# Patient Record
Sex: Female | Born: 1940 | Race: White | Hispanic: No | Marital: Married | Smoking: Former smoker
Health system: Southern US, Community
[De-identification: ages and names within clinical notes are randomized; demographics above are authoritative.]

## PROBLEM LIST (undated history)

## (undated) DIAGNOSIS — I10 Essential (primary) hypertension: Secondary | ICD-10-CM

## (undated) DIAGNOSIS — E079 Disorder of thyroid, unspecified: Secondary | ICD-10-CM

## (undated) DIAGNOSIS — K219 Gastro-esophageal reflux disease without esophagitis: Secondary | ICD-10-CM

## (undated) HISTORY — PX: ABDOMINAL HYSTERECTOMY: SHX81

## (undated) HISTORY — PX: APPENDECTOMY: SHX54

## (undated) HISTORY — PX: EYE SURGERY: SHX253

## (undated) HISTORY — PX: CHOLECYSTECTOMY: SHX55

---

## 2017-11-11 ENCOUNTER — Emergency Department (HOSPITAL_BASED_OUTPATIENT_CLINIC_OR_DEPARTMENT_OTHER)
Admission: EM | Admit: 2017-11-11 | Discharge: 2017-11-11 | Disposition: A | Discharge status: Home / Self Care | Payer: Medicare HMO | Attending: Emergency Medicine | Admitting: Emergency Medicine

## 2017-11-11 ENCOUNTER — Other Ambulatory Visit: Payer: Self-pay

## 2017-11-11 ENCOUNTER — Emergency Department (HOSPITAL_BASED_OUTPATIENT_CLINIC_OR_DEPARTMENT_OTHER): Payer: Medicare HMO

## 2017-11-11 ENCOUNTER — Encounter (HOSPITAL_BASED_OUTPATIENT_CLINIC_OR_DEPARTMENT_OTHER): Payer: Self-pay | Admitting: *Deleted

## 2017-11-11 DIAGNOSIS — I10 Essential (primary) hypertension: Secondary | ICD-10-CM | POA: Diagnosis not present

## 2017-11-11 DIAGNOSIS — E079 Disorder of thyroid, unspecified: Secondary | ICD-10-CM | POA: Diagnosis not present

## 2017-11-11 DIAGNOSIS — Y93K9 Activity, other involving animal care: Secondary | ICD-10-CM | POA: Diagnosis not present

## 2017-11-11 DIAGNOSIS — Y92008 Other place in unspecified non-institutional (private) residence as the place of occurrence of the external cause: Secondary | ICD-10-CM | POA: Diagnosis not present

## 2017-11-11 DIAGNOSIS — W010XXA Fall on same level from slipping, tripping and stumbling without subsequent striking against object, initial encounter: Secondary | ICD-10-CM | POA: Insufficient documentation

## 2017-11-11 DIAGNOSIS — Z79899 Other long term (current) drug therapy: Secondary | ICD-10-CM | POA: Diagnosis not present

## 2017-11-11 DIAGNOSIS — S92502A Displaced unspecified fracture of left lesser toe(s), initial encounter for closed fracture: Secondary | ICD-10-CM

## 2017-11-11 DIAGNOSIS — Y999 Unspecified external cause status: Secondary | ICD-10-CM | POA: Diagnosis not present

## 2017-11-11 DIAGNOSIS — S92515A Nondisplaced fracture of proximal phalanx of left lesser toe(s), initial encounter for closed fracture: Secondary | ICD-10-CM | POA: Insufficient documentation

## 2017-11-11 DIAGNOSIS — S99922A Unspecified injury of left foot, initial encounter: Secondary | ICD-10-CM | POA: Diagnosis present

## 2017-11-11 HISTORY — DX: Essential (primary) hypertension: I10

## 2017-11-11 HISTORY — DX: Gastro-esophageal reflux disease without esophagitis: K21.9

## 2017-11-11 HISTORY — DX: Disorder of thyroid, unspecified: E07.9

## 2017-11-11 IMAGING — DX DG FOOT COMPLETE 3+V*L*
3 series · 3 of 3 positions shown · non-contrast
Comparison: None.

CLINICAL DATA: Left foot pain with swelling and bruising after fall
tonight.

EXAM:
LEFT FOOT - COMPLETE 3+ VIEW

[foot ap]
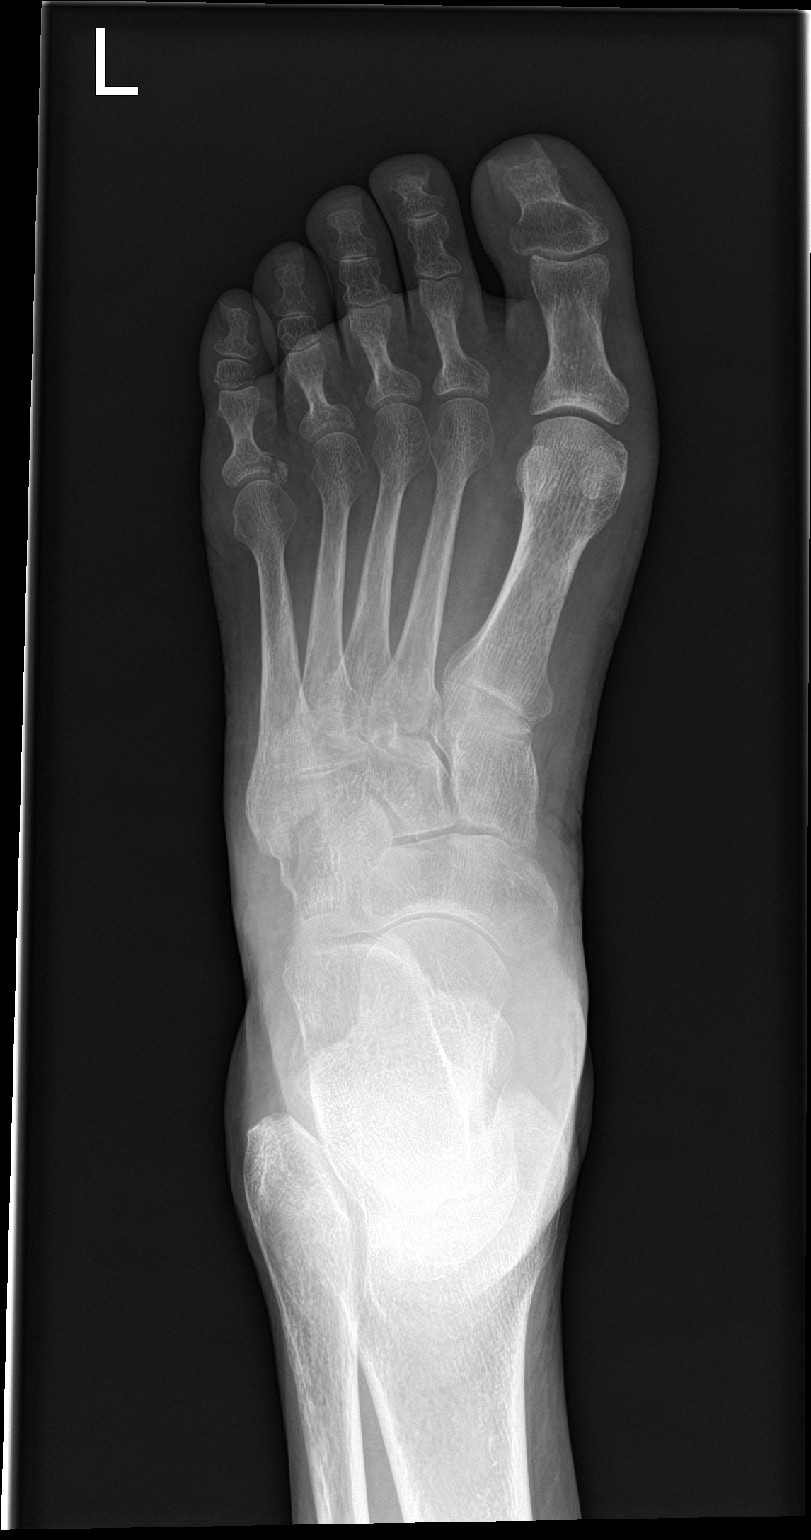

[foot obl]
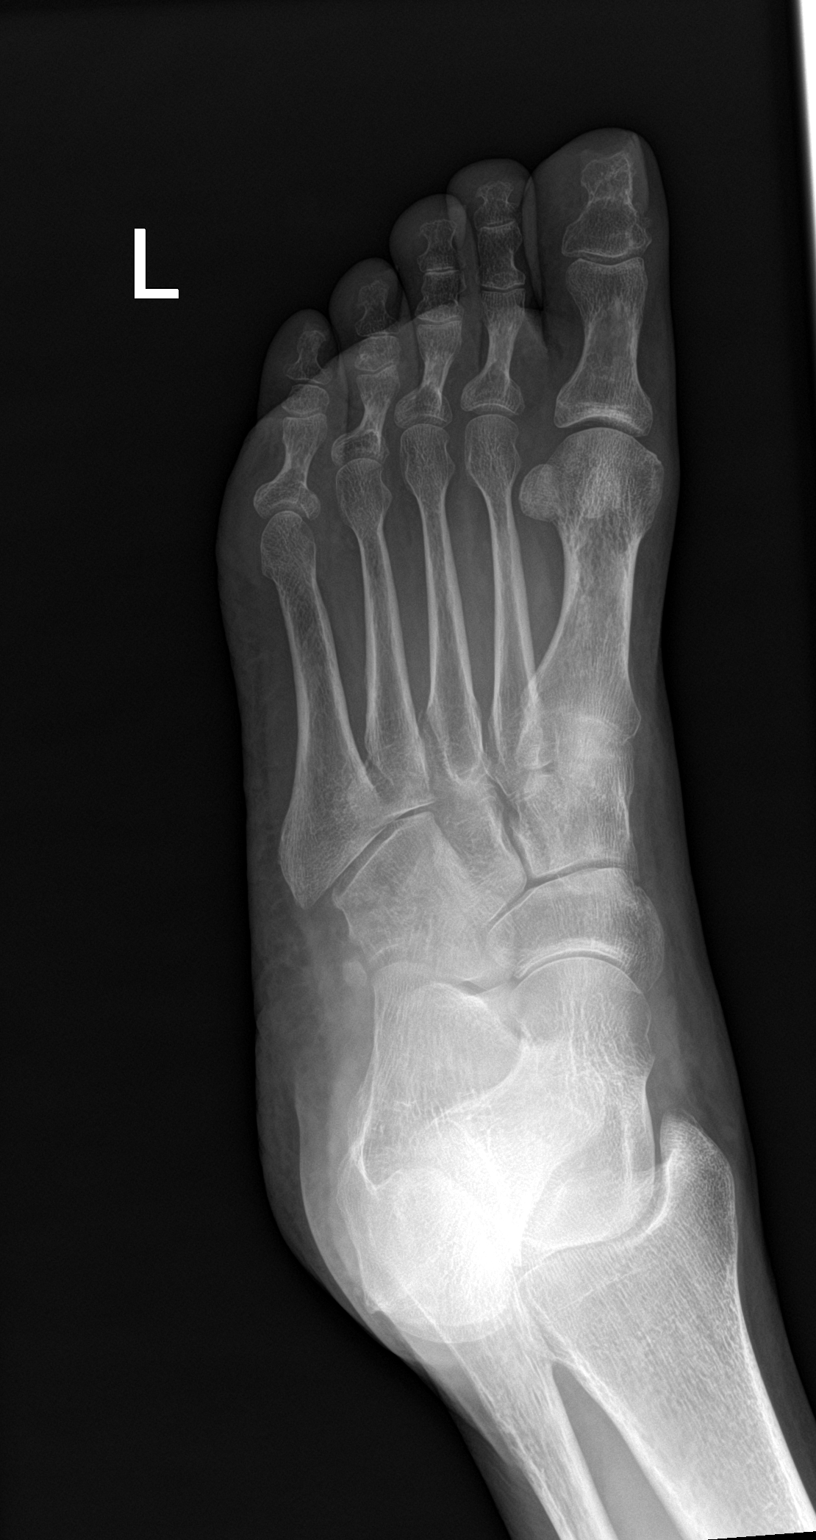

[foot lat]
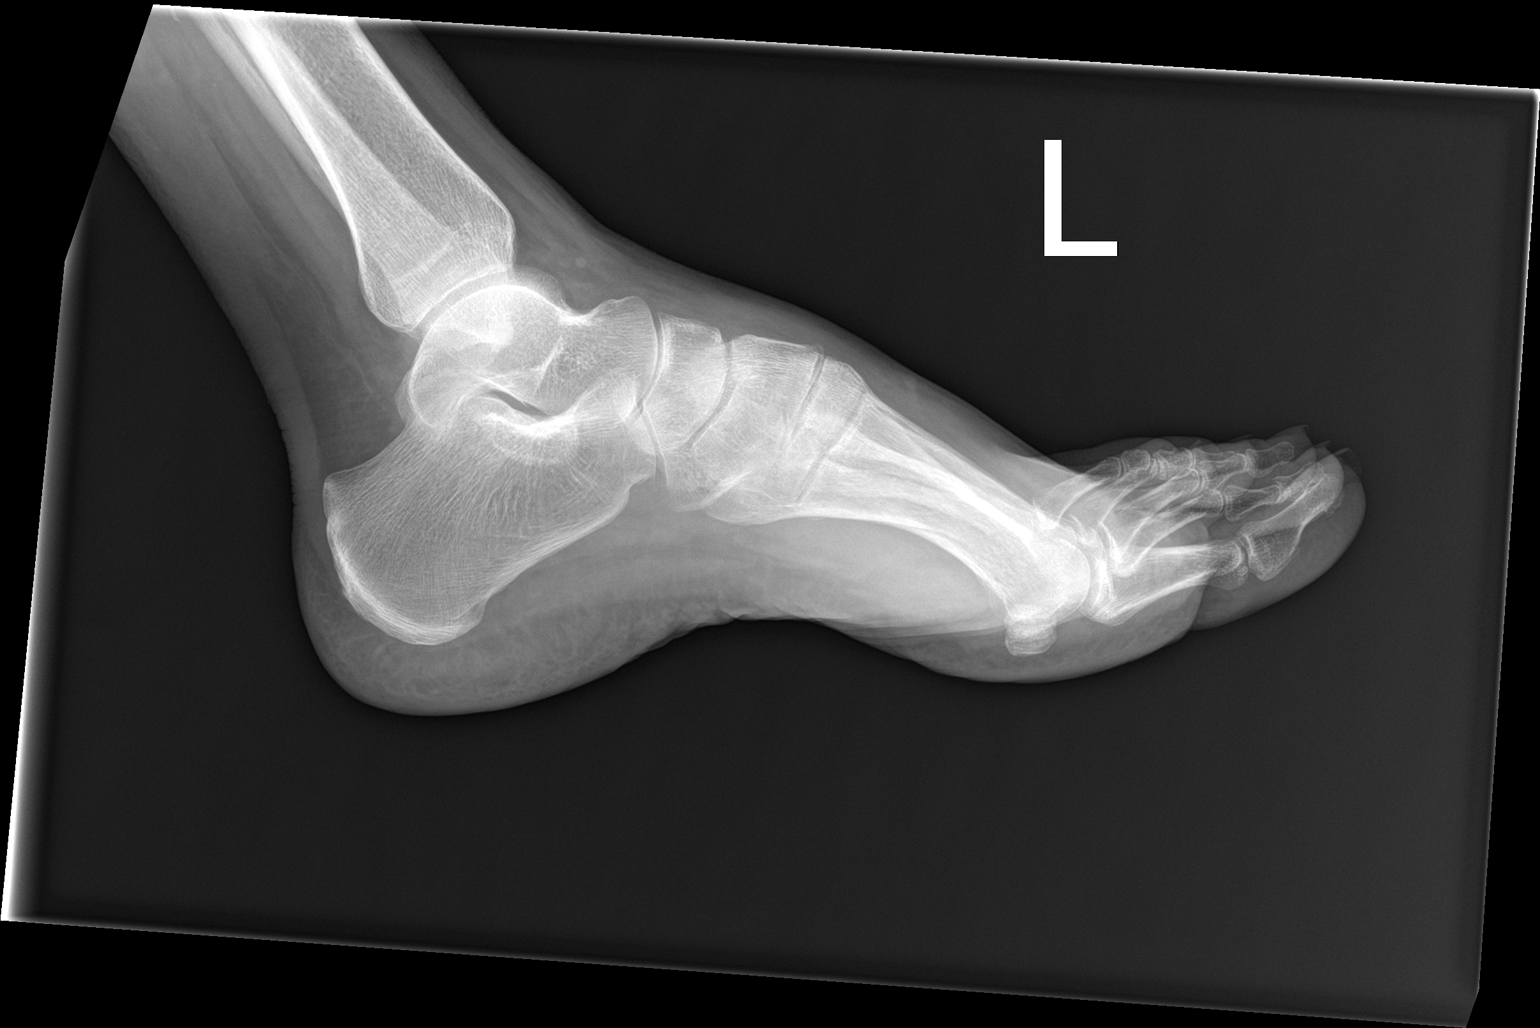

[3 of 3 positions shown; findings below may reference images not displayed]

FINDINGS: There is acute fracture of the medial base of the proximal phalanx
small toe extending into the proximal articular surface.

No malalignment at the Lisfranc joint. Exaggerated longitudinal arch
of the foot on the lateral projection, which is not performed with
the patient bearing weight. No additional fracture identified.
IMPRESSION: 1. Acute fracture of the medial base of the proximal phalanx small
toe extending to the proximal articular surface.

## 2017-11-11 MED ORDER — ACETAMINOPHEN 500 MG PO TABS
1000.0000 mg | ORAL_TABLET | Freq: Once | ORAL | Status: AC
  Administered 2017-11-11: 1000 mg via ORAL
  Filled 2017-11-11: qty 2

## 2017-11-11 MED ORDER — OXYCODONE HCL 5 MG PO TABS
2.5000 mg | ORAL_TABLET | Freq: Once | ORAL | Status: AC
  Administered 2017-11-11: 2.5 mg via ORAL
  Filled 2017-11-11: qty 1

## 2017-11-11 NOTE — ED Triage Notes (Signed)
She tripped on her bedroom shoe while turning and fell. Injury to her left foot. Swelling and bruising. Pain in her upper left leg.

## 2017-11-11 NOTE — ED Provider Notes (Signed)
[facility] EMERGENCY DEPARTMENT Provider Note   CSN: [id number] Arrival date & time: 11/11/17  2156     History   Chief Complaint Chief Complaint  Patient presents with  . Foot Injury    HPI Karina [name] is a 77 y.o. female.  77 yo F with a chief complaint of left foot pain.  This happened about 4 hours ago.  The patient had letter dog out and her slipper had lost traction and she fell.  She hurt her foot denies any other injury.  Denies head injury neck pain loss of consciousness chest pain abdominal pain.  Initially was able to bear weight but feels it is getting worse.  The history is provided by the patient.  Foot Injury   The incident occurred 3 to 5 hours ago. The incident occurred at home. The injury mechanism was a fall. The pain is present in the left foot. The quality of the pain is described as aching. The pain is at a severity of 10/10. The pain is severe. The pain has been constant since onset. She reports no foreign bodies present. The symptoms are aggravated by activity, bearing weight and palpation. She has tried nothing for the symptoms. The treatment provided no relief.    Past Medical History:  Diagnosis Date  . GERD (gastroesophageal reflux disease)   . Hypertension   . Thyroid disease     There are no active problems to display for this patient.   Past Surgical History:  Procedure Laterality Date  . ABDOMINAL HYSTERECTOMY    . APPENDECTOMY    . CHOLECYSTECTOMY    . EYE SURGERY       OB History   None      Home Medications    Prior to Admission medications   Medication Sig Start Date End Date Taking? Authorizing Provider  Levothyroxine Sodium (SYNTHROID PO) Take by mouth.   Yes [provider]  Metoprolol-hydroCHLOROthiazide (METOPROLOL-HCTZ ER PO) Take by mouth.   Yes [provider]  raNITIdine HCl (RANITIDINE 75 PO) Take by mouth.   Yes [provider]    Family History No family history on  file.  Social History Social History   Tobacco Use  . Smoking status: Never Smoker  . Smokeless tobacco: Never Used  Substance Use Topics  . Alcohol use: Never    Frequency: Never  . Drug use: Never     Allergies   Erythromycin   Review of Systems Review of Systems  Constitutional: Negative for chills and fever.  HENT: Negative for congestion and rhinorrhea.   Eyes: Negative for redness and visual disturbance.  Respiratory: Negative for shortness of breath and wheezing.   Cardiovascular: Negative for chest pain and palpitations.  Gastrointestinal: Negative for nausea and vomiting.  Genitourinary: Negative for dysuria and urgency.  Musculoskeletal: Positive for arthralgias and gait problem. Negative for myalgias.  Skin: Negative for pallor and wound.  Neurological: Negative for dizziness and headaches.     Physical Exam Updated Vital Signs BP (!) 170/74   Pulse 79   Temp 98.3 F (36.8 C) (Oral)   Resp 18   Ht 5\' 2"  (1.575 m)   Wt 69.9 kg   SpO2 99%   BMI 28.17 kg/m   Physical Exam  Constitutional: She is oriented to person, place, and time. She appears well-developed and well-nourished. No distress.  HENT:  Head: Normocephalic and atraumatic.  Eyes: Pupils are equal, round, and reactive to light. EOM are normal.  Neck: Normal  range of motion. Neck supple.  Cardiovascular: Normal rate and regular rhythm. Exam reveals no gallop and no friction rub.  No murmur heard. Pulmonary/Chest: Effort normal. She has no wheezes. She has no rales.  Abdominal: Soft. She exhibits no distension. There is no tenderness.  Musculoskeletal: She exhibits edema. She exhibits no tenderness.  Bruising to the left foot. Pain worst to the top of the foot.  PMS intact distally.   Neurological: She is alert and oriented to person, place, and time.  Skin: Skin is warm and dry. She is not diaphoretic.  Psychiatric: She has a normal mood and affect. Her behavior is normal.  Nursing note  and vitals reviewed.    ED Treatments / Results  Labs (all labs ordered are listed, but only abnormal results are displayed) Labs Reviewed - No data to display  EKG None  Radiology Dg Foot Complete Left  Result Date: 11/11/2017 CLINICAL DATA:  Left foot pain with swelling and bruising after fall tonight. EXAM: LEFT FOOT - COMPLETE 3+ VIEW COMPARISON:  None. FINDINGS: There is acute fracture of the medial base of the proximal phalanx small toe extending into the proximal articular surface. No malalignment at the Lisfranc joint. Exaggerated longitudinal arch of the foot on the lateral projection, which is not performed with the patient bearing weight. No additional fracture identified. IMPRESSION: 1. Acute fracture of the medial base of the proximal phalanx small toe extending to the proximal articular surface. Electronically Signed   By: Gaylyn Rong M.D.   On: 11/11/2017 22:59    Procedures Procedures (including critical care time)  Medications Ordered in ED Medications  acetaminophen (TYLENOL) tablet 1,000 mg (1,000 mg Oral Given 11/11/17 2231)  oxyCODONE (Oxy IR/ROXICODONE) immediate release tablet 2.5 mg (2.5 mg Oral Given 11/11/17 2231)     Initial Impression / Assessment and Plan / ED Course  I have reviewed the triage vital signs and the nursing notes.  Pertinent labs & imaging results that were available during my care of the patient were reviewed by me and considered in my medical decision making (see chart for details).     77 yo F with a chief complaint of left foot pain.  Mechanical fall earlier.  Will xray.   X-ray with a fracture of the medial base of the proximal phalanx of the left fifth digit as viewed by me.  No fracture in the area of the patient's most tenderness.  Will place in a cam walker.  My concern with a postop shoe is the likely issue of the patient trying to bear weight so on that and then falling again.  11:11 PM:  I have discussed the  diagnosis/risks/treatment options with the patient and family and believe the pt to be eligible for discharge home to follow-up with PCP. We also discussed returning to the ED immediately if new or worsening sx occur. We discussed the sx which are most concerning (e.g., sudden worsening pain, fever, inability to tolerate by mouth) that necessitate immediate return. Medications administered to the patient during their visit and any new prescriptions provided to the patient are listed below.  Medications given during this visit Medications  acetaminophen (TYLENOL) tablet 1,000 mg (1,000 mg Oral Given 11/11/17 2231)  oxyCODONE (Oxy IR/ROXICODONE) immediate release tablet 2.5 mg (2.5 mg Oral Given 11/11/17 2231)     The patient appears reasonably screen and/or stabilized for discharge and I doubt any other medical condition or other [facility] requiring further screening, evaluation, or treatment in the ED  at this time prior to discharge.    Final Clinical Impressions(s) / ED Diagnoses   Final diagnoses:  Closed fracture of phalanx of left fifth toe, initial encounter    ED Discharge Orders    None       Melene Plan, DO 11/11/17 2311

## 2017-11-11 NOTE — Discharge Instructions (Signed)
Follow up with your family doc in a few days.  Return for worsening pain, inability to walk.

## 2019-09-09 ENCOUNTER — Other Ambulatory Visit: Payer: Self-pay

## 2019-09-09 ENCOUNTER — Observation Stay (HOSPITAL_BASED_OUTPATIENT_CLINIC_OR_DEPARTMENT_OTHER)
Admission: EM | Admit: 2019-09-09 | Discharge: 2019-09-10 | Disposition: A | Discharge status: Home / Self Care | Payer: Medicare HMO | Attending: Family Medicine | Admitting: Family Medicine

## 2019-09-09 ENCOUNTER — Emergency Department (HOSPITAL_COMMUNITY): Payer: Medicare HMO

## 2019-09-09 ENCOUNTER — Emergency Department (HOSPITAL_BASED_OUTPATIENT_CLINIC_OR_DEPARTMENT_OTHER): Payer: Medicare HMO

## 2019-09-09 DIAGNOSIS — E039 Hypothyroidism, unspecified: Secondary | ICD-10-CM | POA: Insufficient documentation

## 2019-09-09 DIAGNOSIS — Z7982 Long term (current) use of aspirin: Secondary | ICD-10-CM | POA: Insufficient documentation

## 2019-09-09 DIAGNOSIS — E876 Hypokalemia: Secondary | ICD-10-CM | POA: Diagnosis not present

## 2019-09-09 DIAGNOSIS — I10 Essential (primary) hypertension: Secondary | ICD-10-CM | POA: Insufficient documentation

## 2019-09-09 DIAGNOSIS — I639 Cerebral infarction, unspecified: Principal | ICD-10-CM | POA: Insufficient documentation

## 2019-09-09 DIAGNOSIS — Z20822 Contact with and (suspected) exposure to covid-19: Secondary | ICD-10-CM | POA: Insufficient documentation

## 2019-09-09 DIAGNOSIS — N179 Acute kidney failure, unspecified: Secondary | ICD-10-CM | POA: Diagnosis not present

## 2019-09-09 DIAGNOSIS — R519 Headache, unspecified: Secondary | ICD-10-CM | POA: Diagnosis present

## 2019-09-09 DIAGNOSIS — I6523 Occlusion and stenosis of bilateral carotid arteries: Secondary | ICD-10-CM | POA: Insufficient documentation

## 2019-09-09 DIAGNOSIS — H538 Other visual disturbances: Secondary | ICD-10-CM | POA: Diagnosis not present

## 2019-09-09 DIAGNOSIS — Z79899 Other long term (current) drug therapy: Secondary | ICD-10-CM | POA: Insufficient documentation

## 2019-09-09 LAB — CBC WITH DIFFERENTIAL/PLATELET
Abs Immature Granulocytes: 0.04 10*3/uL (ref 0.00–0.07)
Basophils Absolute: 0.1 10*3/uL (ref 0.0–0.1)
Basophils Relative: 1 %
Eosinophils Absolute: 0.7 10*3/uL — ABNORMAL HIGH (ref 0.0–0.5)
Eosinophils Relative: 7 %
HCT: 37.7 % (ref 36.0–46.0)
Hemoglobin: 12.5 g/dL (ref 12.0–15.0)
Immature Granulocytes: 0 %
Lymphocytes Relative: 27 %
Lymphs Abs: 2.7 10*3/uL (ref 0.7–4.0)
MCH: 28.9 pg (ref 26.0–34.0)
MCHC: 33.2 g/dL (ref 30.0–36.0)
MCV: 87.1 fL (ref 80.0–100.0)
Monocytes Absolute: 1 10*3/uL (ref 0.1–1.0)
Monocytes Relative: 10 %
Neutro Abs: 5.4 10*3/uL (ref 1.7–7.7)
Neutrophils Relative %: 55 %
Platelets: 395 10*3/uL (ref 150–400)
RBC: 4.33 MIL/uL (ref 3.87–5.11)
RDW: 14.7 % (ref 11.5–15.5)
WBC: 9.9 10*3/uL (ref 4.0–10.5)
nRBC: 0 % (ref 0.0–0.2)

## 2019-09-09 LAB — COMPREHENSIVE METABOLIC PANEL
ALT: 11 U/L (ref 0–44)
AST: 35 U/L (ref 15–41)
Albumin: 4.3 g/dL (ref 3.5–5.0)
Alkaline Phosphatase: 37 U/L — ABNORMAL LOW (ref 38–126)
Anion gap: 8 (ref 5–15)
BUN: 23 mg/dL (ref 8–23)
CO2: 29 mmol/L (ref 22–32)
Calcium: 10.4 mg/dL — ABNORMAL HIGH (ref 8.9–10.3)
Chloride: 101 mmol/L (ref 98–111)
Creatinine, Ser: 1.45 mg/dL — ABNORMAL HIGH (ref 0.44–1.00)
GFR calc Af Amer: 40 mL/min — ABNORMAL LOW (ref >60)
GFR calc non Af Amer: 35 mL/min — ABNORMAL LOW (ref >60)
Glucose, Bld: 97 mg/dL (ref 70–99)
Potassium: 4.7 mmol/L (ref 3.5–5.1)
Sodium: 138 mmol/L (ref 135–145)
Total Bilirubin: 0.5 mg/dL (ref 0.3–1.2)
Total Protein: 7.1 g/dL (ref 6.5–8.1)

## 2019-09-09 LAB — CBG MONITORING, ED: Glucose-Capillary: 97 mg/dL (ref 70–99)

## 2019-09-09 LAB — HEMOGLOBIN A1C
Hgb A1c MFr Bld: 5.9 % — ABNORMAL HIGH (ref 4.8–5.6)
Mean Plasma Glucose: 122.63 mg/dL

## 2019-09-09 LAB — SEDIMENTATION RATE: Sed Rate: 9 mm/hr (ref 0–22)

## 2019-09-09 LAB — LIPID PANEL
Cholesterol: 159 mg/dL (ref 0–200)
HDL: 28 mg/dL — ABNORMAL LOW (ref >40)
LDL Cholesterol: 84 mg/dL (ref 0–99)
Total CHOL/HDL Ratio: 5.7 RATIO
Triglycerides: 233 mg/dL — ABNORMAL HIGH (ref <150)
VLDL: 47 mg/dL — ABNORMAL HIGH (ref 0–40)

## 2019-09-09 LAB — GLUCOSE, CAPILLARY
Glucose-Capillary: 110 mg/dL — ABNORMAL HIGH (ref 70–99)
Glucose-Capillary: 99 mg/dL (ref 70–99)

## 2019-09-09 LAB — TSH: TSH: 0.74 u[IU]/mL (ref 0.350–4.500)

## 2019-09-09 LAB — SARS CORONAVIRUS 2 BY RT PCR (HOSPITAL ORDER, PERFORMED IN ~~LOC~~ HOSPITAL LAB): SARS Coronavirus 2: NEGATIVE

## 2019-09-09 IMAGING — MR MR HEAD WO/W CM
6 of 13 series · 22 of 48 positions shown · IV contrast (gadavist)
Comparison: Noncontrast head CT [DATE], CT angiogram head/neck
[DATE].

CLINICAL DATA: Neuro deficit, acute, stroke suspected; brain mass
or lesion. Additional provided: Patient reports headache and blurred
vision for 1 week, unable to see primary medical doctor, patient
states vision grossly intact but fuzzy.

EXAM:
MRI HEAD WITHOUT AND WITH CONTRAST
TECHNIQUE: Multiplanar, multiecho pulse sequences of the brain and surrounding
structures were obtained without and with intravenous contrast.
CONTRAST:  7mL GADAVIST GADOBUTROL 1 MMOL/ML IV SOLN

[Series 2: DWI · axial · 3.0mm · 0.94mm/px · z∈[-90,+59]mm · 8 of 104 slices shown (1 of 2)]
[im 1/104]
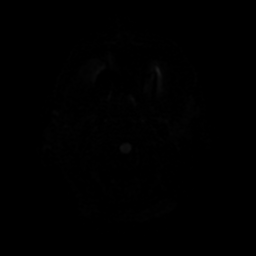
[im 15/104]
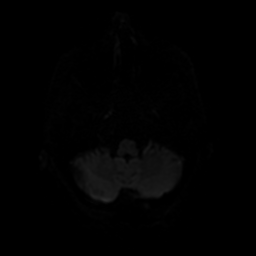
[im 30/104]
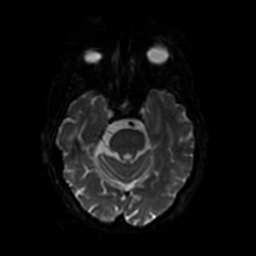
[im 45/104]
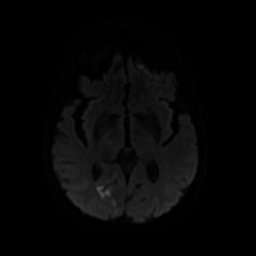
[im 59/104]
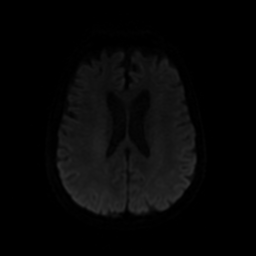
[im 74/104]
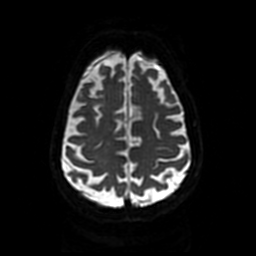
[im 89/104]
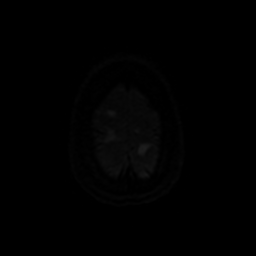
[im 104/104]
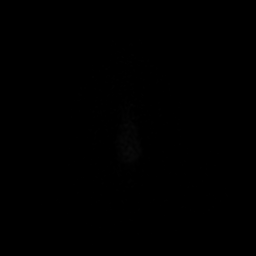

[Series 3: DWI · coronal · 4.0mm · 0.94mm/px · 5 of 74 slices shown (2 of 2)]
[im 1/74]
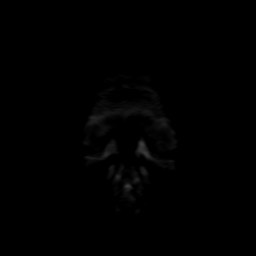
[im 19/74]
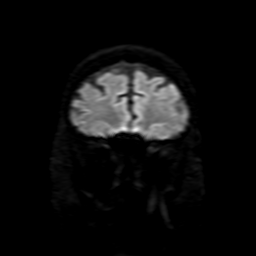
[im 37/74]
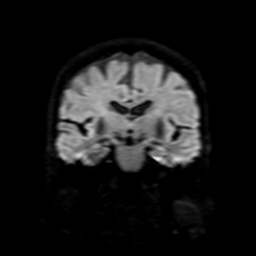
[im 55/74]
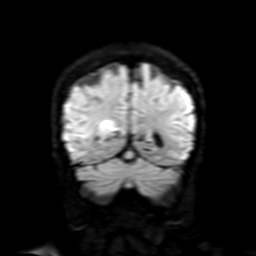
[im 74/74]
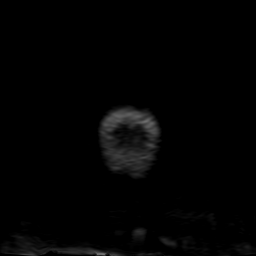

[Series 4: FLAIR · sagittal · 5.0mm · 0.23mm/px · 2 of 26 slices shown (1 of 2)]
[im 1/26]
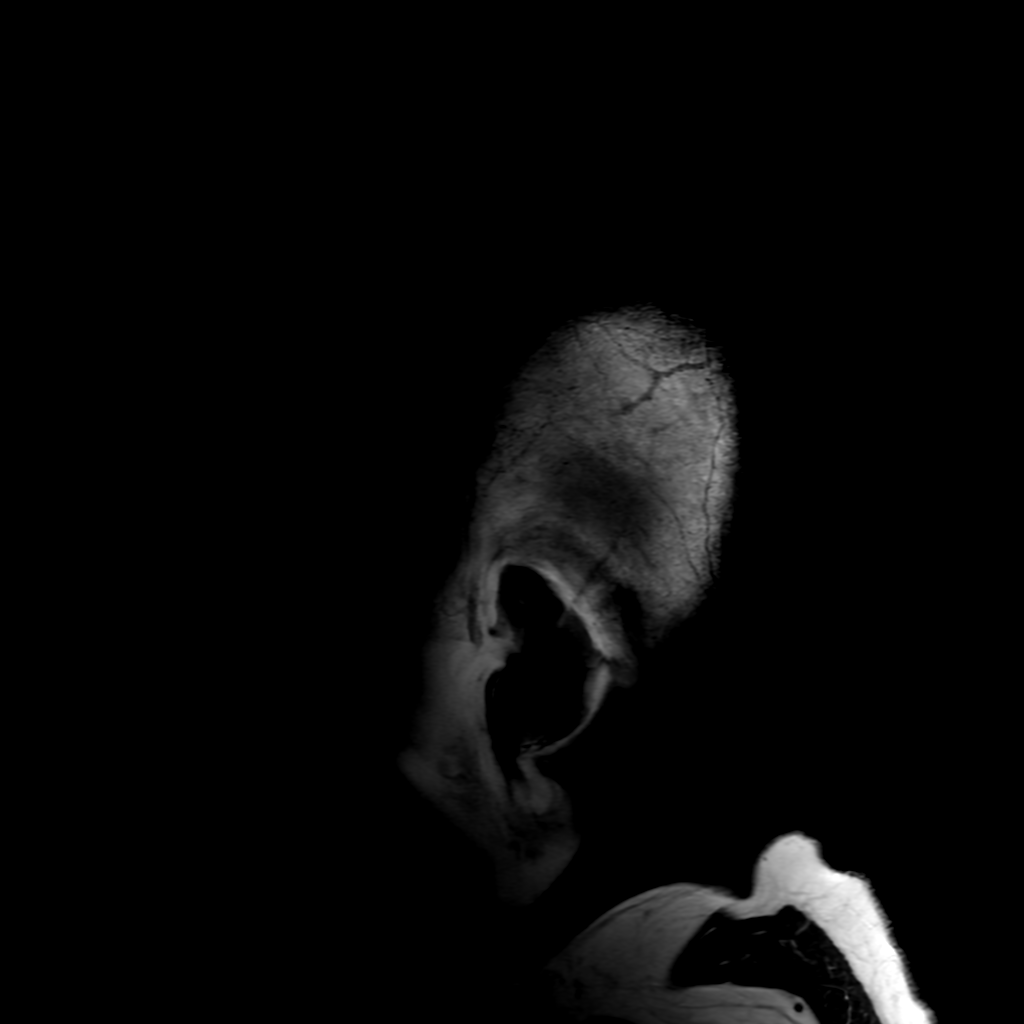
[im 26/26]
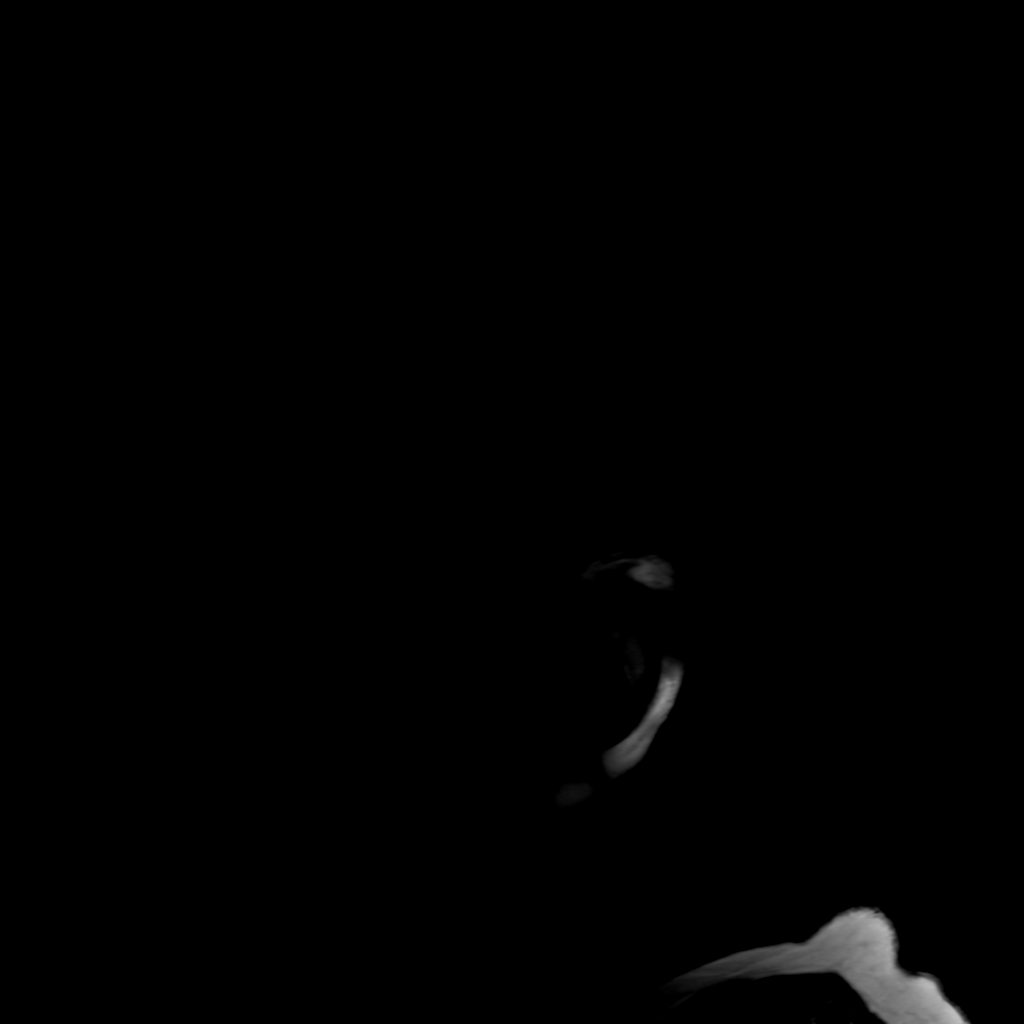

[Series 6: FLAIR · axial · 3.0mm · 0.41mm/px · z∈[-83,+63]mm · 2 of 26 slices shown (2 of 2)]
[im 1/26]
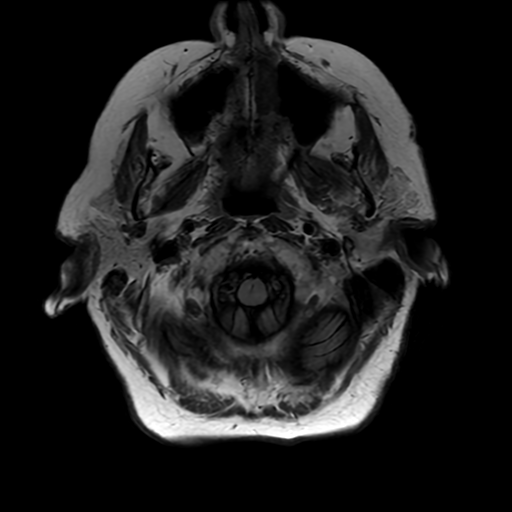
[im 26/26]
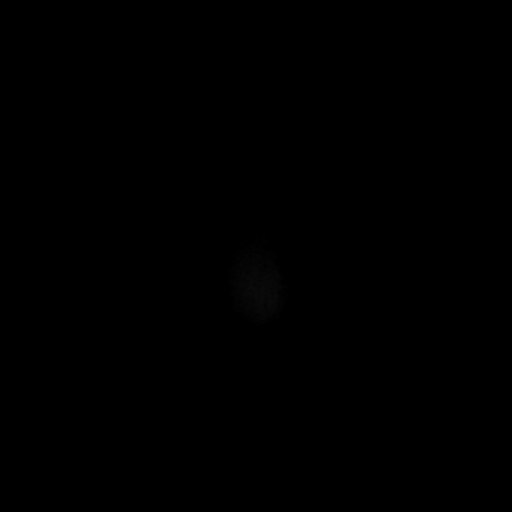

[Series 250: ADC · axial · 3.0mm · 0.94mm/px · z∈[-90,+59]mm · 3 of 51 slices shown (1 of 2)]
[im 1/51]
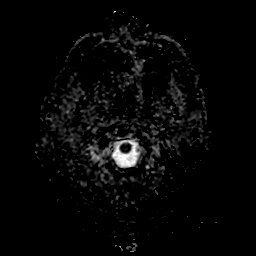
[im 26/51]
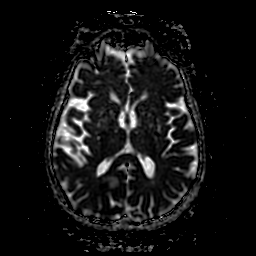
[im 51/51]
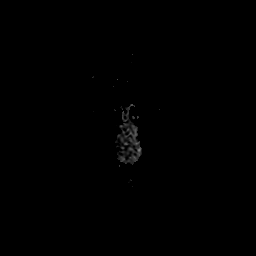

[Series 350: ADC · coronal · 4.0mm · 0.94mm/px · 2 of 37 slices shown (2 of 2)]
[im 1/37]
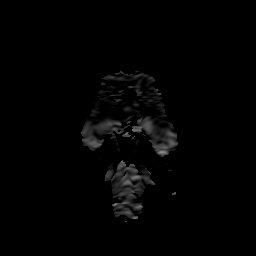
[im 37/37]
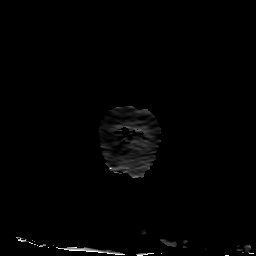

[22 of 48 positions shown; findings below may reference images not displayed]

FINDINGS: Brain:

Moderate generalized parenchymal atrophy

2.8 cm region of cortically based restricted diffusion within the
paramedian right parietooccipital lobes and callosal splenium on the
right, within the right PCA vascular territory. There is
corresponding T2/FLAIR hyperintensity and enhancement at these
sites. These findings are most consistent with early subacute right
PCA territory ischemic infarcts. There is a small focus of SWI
signal loss within the infarction territory which may reflect mild
petechial hemorrhage.

Additional acute/early subacute infarcts within the right thalamus
(series 2, image 24), posterior right temporal lobe (series 2, image
18) and left occipital lobe (series 2, image 16).

Background mild patchy T2/FLAIR hyperintensity within the cerebral
white matter is nonspecific, but consistent with chronic small
vessel ischemic disease.

Additional scattered supratentorial chronic microhemorrhages likely
reflecting sequela of hypertensive microangiopathy.

No evidence of intracranial mass.

No extra-axial fluid collection.

No midline shift.

Vascular: No definite loss of expected proximal arterial flow voids.

Skull and upper cervical spine: No focal marrow lesion.

Sinuses/Orbits: Visualized orbits show no acute finding. Prior left
lens replacement. Mild paranasal sinus mucosal thickening, most
notably ethmoid. Trace fluid within right mastoid air cells.
IMPRESSION: 2.8 cm early subacute ischemic infarct within the right PCA
territory affecting the paramedian right parietooccipital lobes and
callosal splenium. Suspected mild petechial hemorrhage at this site.

Additional subcentimeter acute/early subacute infarcts within the
right thalamus, posterior right temporal lobe and left occipital
lobe.

Background moderate generalized parenchymal atrophy and mild chronic
small vessel ischemic disease.

Mild paranasal sinus mucosal thickening.

Trace fluid within right mastoid air cells.

## 2019-09-09 IMAGING — CT CT HEAD W/O CM
3 series · 16 of 47 positions shown, 19 images · non-contrast
Comparison: [DATE]

CLINICAL DATA: Headache, blurred vision

EXAM:
CT HEAD WITHOUT CONTRAST
TECHNIQUE: Contiguous axial images were obtained from the base of the skull
through the vertex without intravenous contrast.

[Series 2: head wo · axial · 0.46mm/px · z∈[-165,-35]mm · 10 of 32 slices shown, 13 images]
[im 3/32  brain]
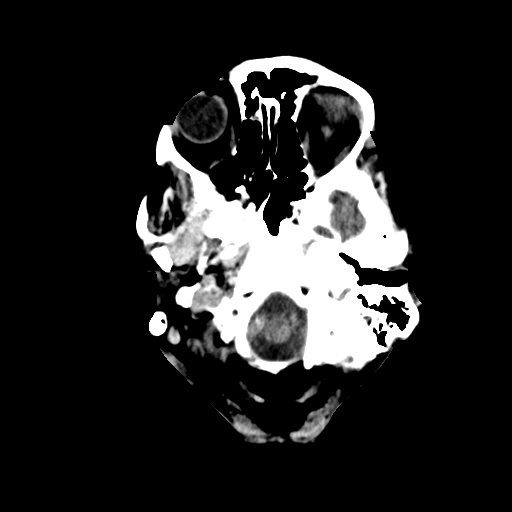
[im 3/32  bone]
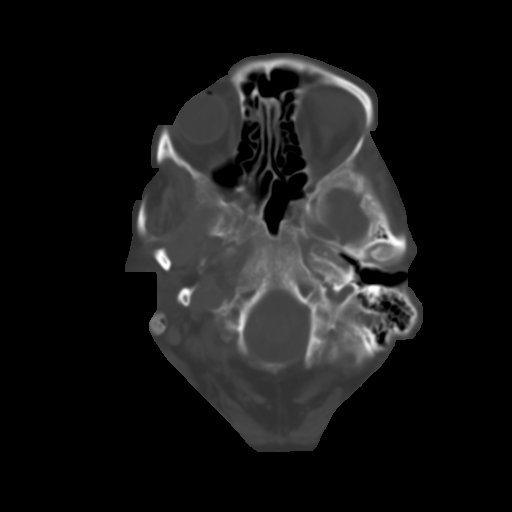
[im 6/32  brain]
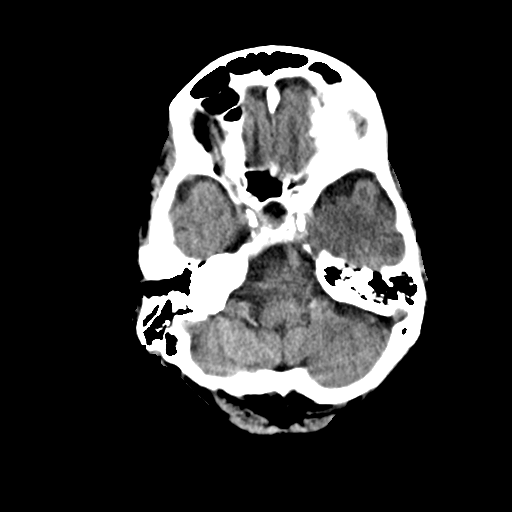
[im 9/32  brain]
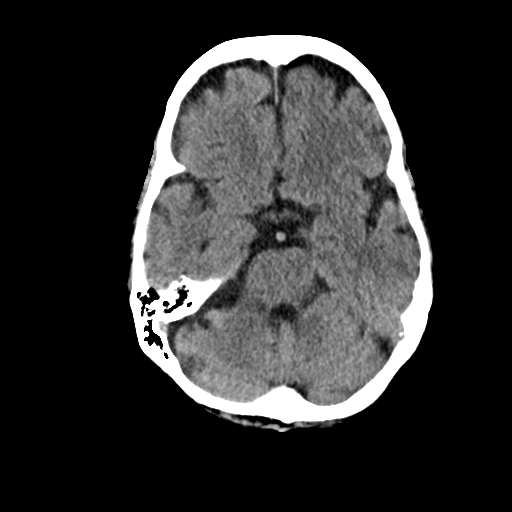
[im 11/32  brain]
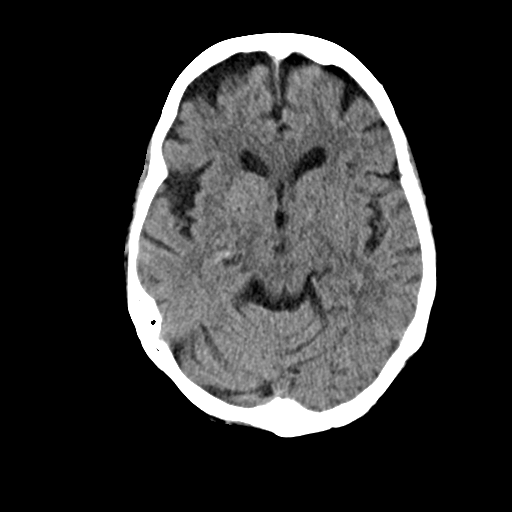
[im 14/32  brain]
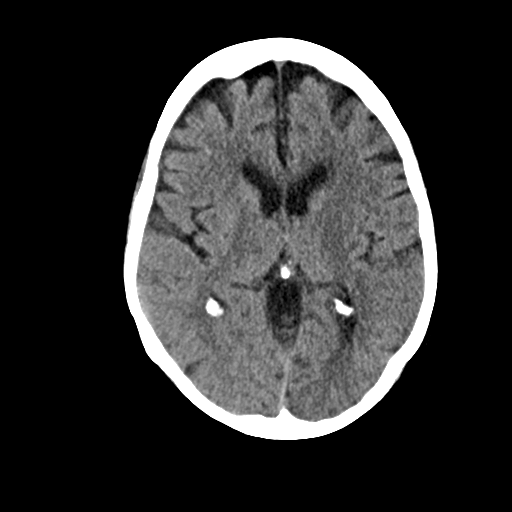
[im 14/32  bone]
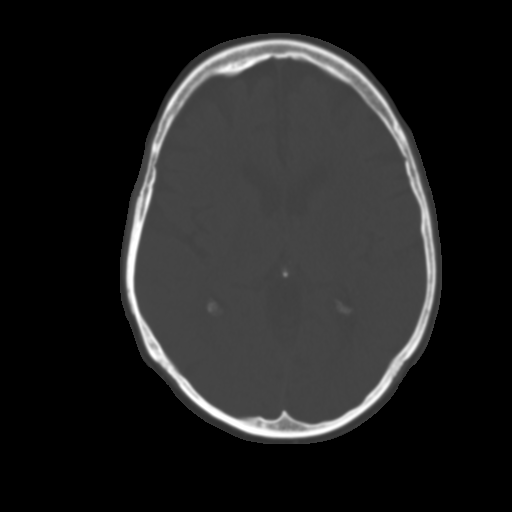
[im 18/32  brain]
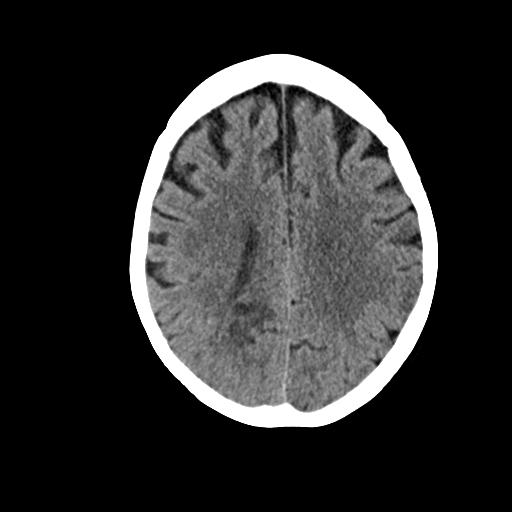
[im 21/32  brain]
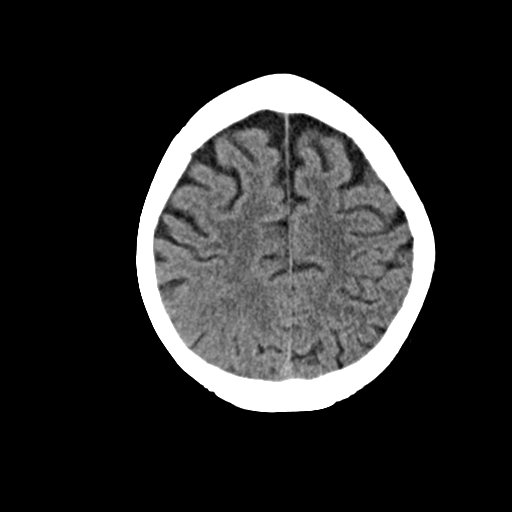
[im 24/32  brain]
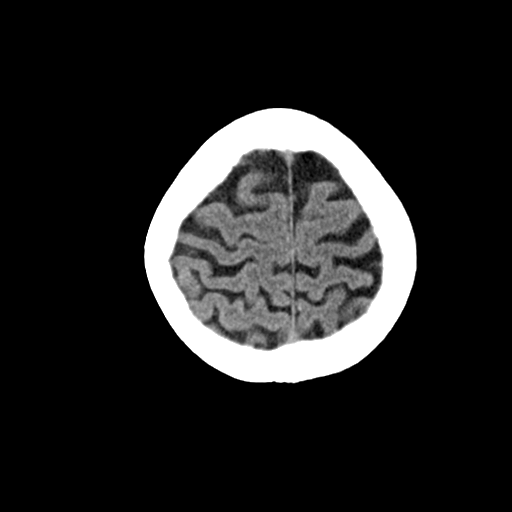
[im 26/32  brain]
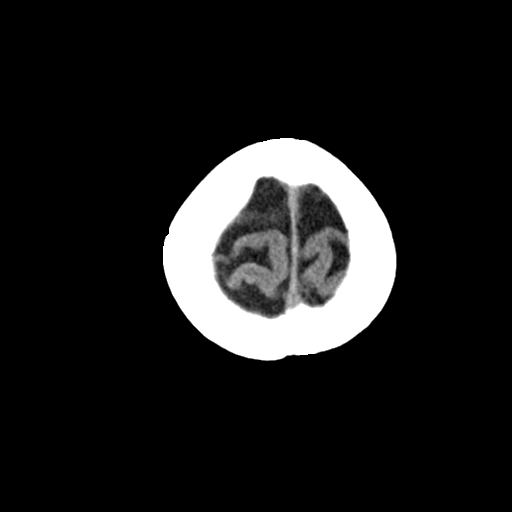
[im 26/32  bone]
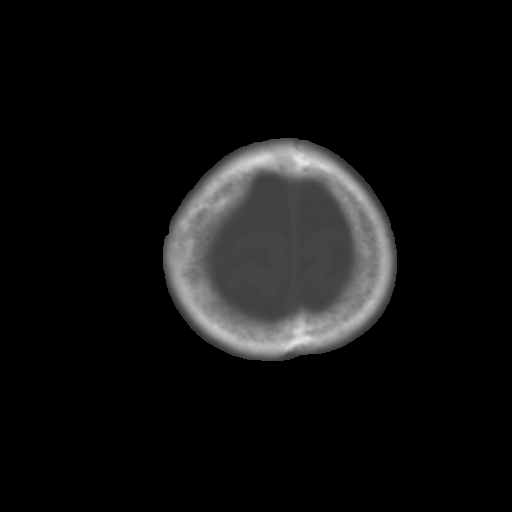
[im 29/32  brain]
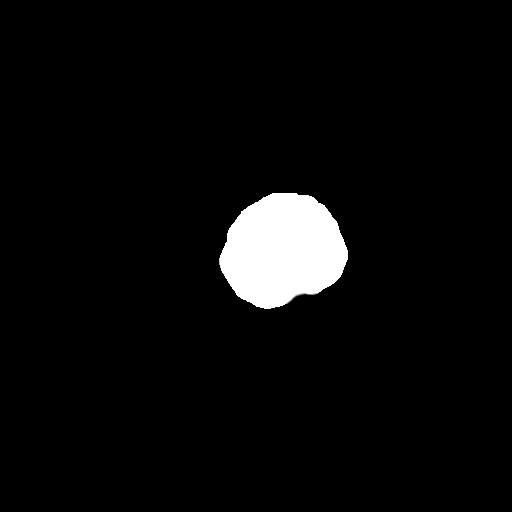

[Series 4: coronal soft · coronal · 0.31mm/px · 3 of 67 slices shown]
[im 23/67  brain]
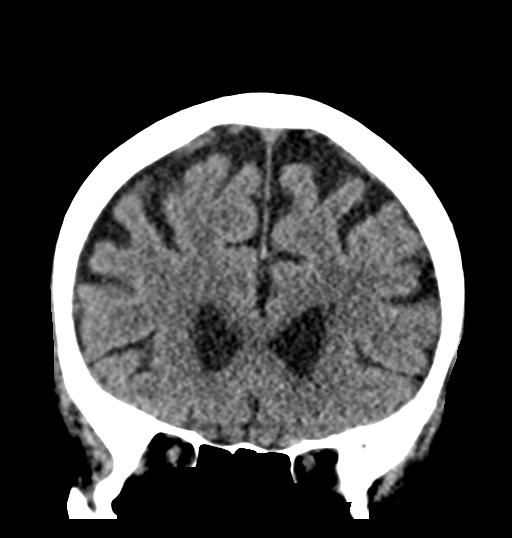
[im 30/67  brain]
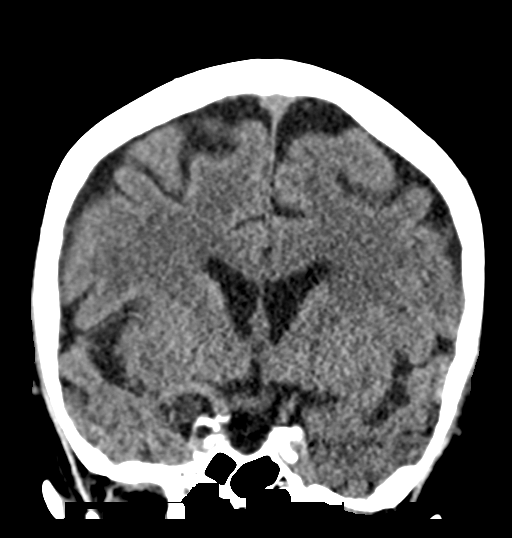
[im 37/67  brain]
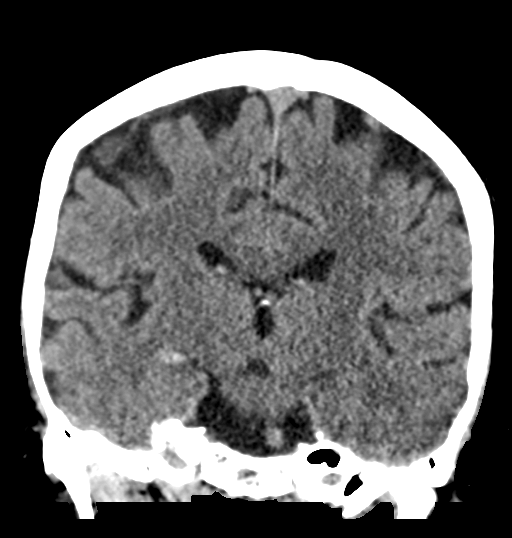

[Series 5: sag soft · sagittal · 0.32mm/px · 3 of 53 slices shown]
[im 18/53  brain]
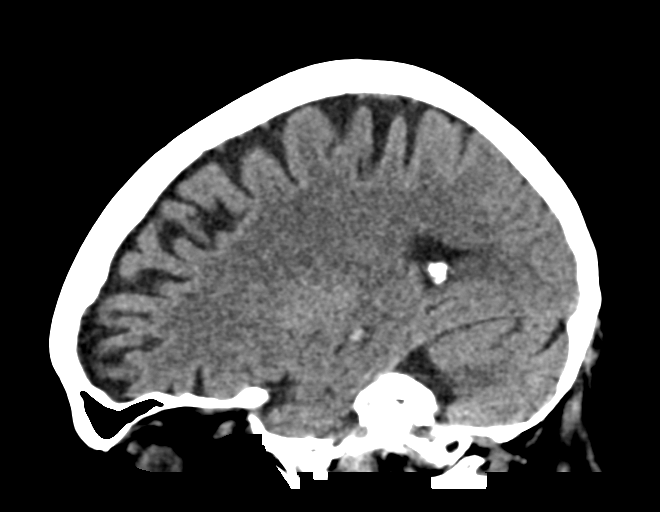
[im 27/53  brain]
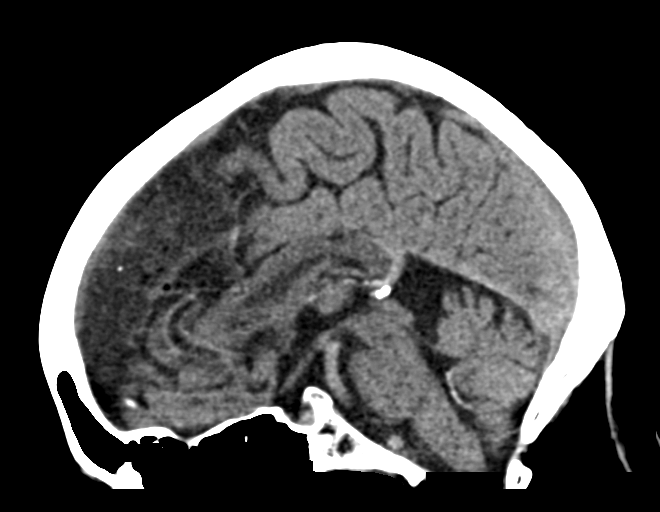
[im 35/53  brain]
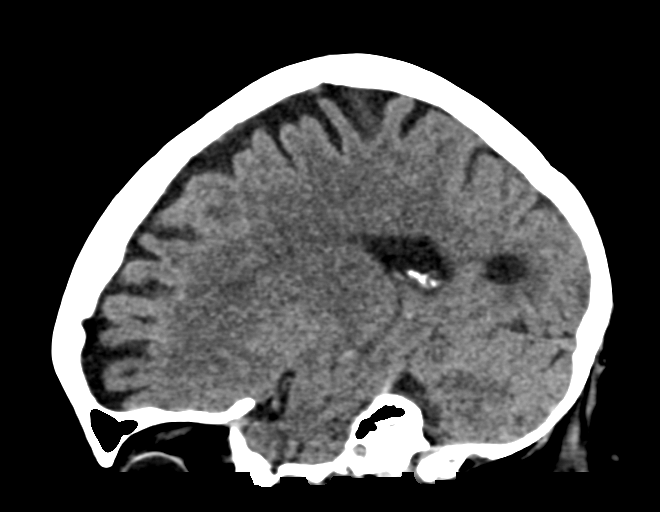

[16 of 47 positions shown; findings below may reference images not displayed]

FINDINGS: Brain: Area of low-density noted in the posteromedial parietal
lobe/occipital lobe on the right concerning for infarct, age
indeterminate. There is atrophy and chronic small vessel disease
changes. No hydrocephalus or hemorrhage.

Vascular: No hyperdense vessel or unexpected calcification.

Skull: No acute calvarial abnormality.

Sinuses/Orbits: Visualized paranasal sinuses and mastoids clear.
Orbital soft tissues unremarkable.

Other: None
IMPRESSION: Area low-density in the posteromedial right parietal lobe/occipital
lobe concerning for age-indeterminate infarct.

Atrophy, chronic small vessel disease.

## 2019-09-09 MED ORDER — LEVOTHYROXINE SODIUM 100 MCG PO TABS
100.0000 ug | ORAL_TABLET | Freq: Every day | ORAL | Status: DC
  Administered 2019-09-10: 100 ug via ORAL
  Filled 2019-09-09: qty 4

## 2019-09-09 MED ORDER — SODIUM CHLORIDE 0.9 % IV SOLN: INTRAVENOUS | Status: AC

## 2019-09-09 MED ORDER — HYDRALAZINE HCL 20 MG/ML IJ SOLN: 5.0000 mg | Freq: Four times a day (QID) | INTRAMUSCULAR | Status: DC | PRN

## 2019-09-09 MED ORDER — ASPIRIN 81 MG PO CHEW
324.0000 mg | CHEWABLE_TABLET | Freq: Once | ORAL | Status: AC
  Administered 2019-09-09: 324 mg via ORAL
  Filled 2019-09-09: qty 4

## 2019-09-09 MED ORDER — ATORVASTATIN CALCIUM 40 MG PO TABS: 40.0000 mg | ORAL_TABLET | Freq: Every day | ORAL | Status: DC

## 2019-09-09 MED ORDER — ONDANSETRON HCL 4 MG/2ML IJ SOLN: 4.0000 mg | Freq: Four times a day (QID) | INTRAMUSCULAR | Status: DC | PRN

## 2019-09-09 MED ORDER — SODIUM CHLORIDE 0.9 % IV BOLUS
1000.0000 mL | Freq: Once | INTRAVENOUS | Status: AC
  Administered 2019-09-09: 1000 mL via INTRAVENOUS

## 2019-09-09 MED ORDER — GADOBUTROL 1 MMOL/ML IV SOLN
7.0000 mL | Freq: Once | INTRAVENOUS | Status: AC | PRN
  Administered 2019-09-09: 7 mL via INTRAVENOUS

## 2019-09-09 MED ORDER — ASPIRIN 325 MG PO TABS
325.0000 mg | ORAL_TABLET | Freq: Every day | ORAL | Status: DC
  Administered 2019-09-10: 325 mg via ORAL
  Filled 2019-09-09: qty 1

## 2019-09-09 MED ORDER — ATORVASTATIN CALCIUM 40 MG PO TABS
40.0000 mg | ORAL_TABLET | Freq: Every day | ORAL | Status: DC
  Administered 2019-09-10: 40 mg via ORAL
  Filled 2019-09-09: qty 1

## 2019-09-09 MED ORDER — ACETAMINOPHEN 325 MG PO TABS: 650.0000 mg | ORAL_TABLET | Freq: Four times a day (QID) | ORAL | Status: DC | PRN

## 2019-09-09 MED ORDER — FENOFIBRATE 54 MG PO TABS
54.0000 mg | ORAL_TABLET | Freq: Every day | ORAL | Status: DC
  Administered 2019-09-10: 54 mg via ORAL
  Filled 2019-09-09: qty 1

## 2019-09-09 NOTE — ED Triage Notes (Addendum)
Pt states headache and blurred vision x 1 week, unable to see pmd.  Pt states vision grossly intact, but fuzzy.  Was seen this morning at [facility], sent here for further eval.

## 2019-09-09 NOTE — ED Notes (Signed)
Report called to [facility] for transport to [facility] ED for MRI. Accepting ED provider Dr Jacqulyn Bath.

## 2019-09-09 NOTE — Plan of Care (Signed)
  Problem: Education: Goal: Knowledge of General Education information will improve Description Including pain rating scale, medication(s)/side effects and non-pharmacologic comfort measures Outcome: Progressing   

## 2019-09-09 NOTE — ED Notes (Signed)
Patient transported to MRI 

## 2019-09-09 NOTE — H&P (Addendum)
History and Physical  Rowen Hur EXB:[id number] DOB: 07/12/1940 DOA: 09/09/2019  Referring physician: Dr. Rush Landmark, EDP PCP: Brooke Bonito, MD  Outpatient Specialists: Gyn Patient coming from: Home transferred from [facility]  Chief Complaint: Headaches for 5 days  HPI: Brittiney Dicostanzo is a 79 y.o. female with medical history significant for essential hypertension, hyperlipidemia, former tobacco user quit in 2014, hypothyroidism, bilateral carotid artery stenosis status post endarterectomy, ambulatory dysfunction uses a walker, ovarian cancer status post hysterectomy in 2014 who presented to [facility] ED as a transfer from [facility] ED due to concern for possible stroke.  Patient presented to the ED due to severe right frontal headaches with onset 5 days ago.  Associated with blurry vision that have now resolved.  Has been taking over-the-counter analgesic with improvement of her headaches.  Denies dizziness or recent falls.  Denies any sensory or motor deficits.  Admits to previous palpitations but none recently.  She takes a baby aspirin 81 mg nightly.  Her last physical at her PCPs office was in January 2021, states her blood pressure was okay at that time.  At [facility] a CT scan of the head was done which showed Area of low-density in the posteromedial right parietal lobe/occipital lobe concerning for age-indeterminate infarct.  Was transferred to [facility] ED for MRI brain.  Neurology/stroke team contacted and recommended TRH, hospitalist, admission for stroke work-up.  ED Course: Initial BP was elevated with diastolic BP 110.  MRI brain showed 2.8 cm early subacute ischemic infarct within the right PCA territory affecting the paramedian right parietooccipital lobes and callosal splenium. Suspected mild petechial hemorrhage at this site. Additional subcentimeter acute/early subacute infarcts within the right thalamus, posterior right temporal lobe and left occipital lobe.  Lab studies remarkable for elevated creatinine  1.45.  Review of Systems: Review of systems as noted in the HPI. All other systems reviewed and are negative.   Past Medical History:  Diagnosis Date  . GERD (gastroesophageal reflux disease)   . Hypertension   . Thyroid disease    Past Surgical History:  Procedure Laterality Date  . ABDOMINAL HYSTERECTOMY    . APPENDECTOMY    . CHOLECYSTECTOMY    . EYE SURGERY      Social History:  reports that she has never smoked. She has never used smokeless tobacco. She reports that she does not drink alcohol and does not use drugs.   Allergies  Allergen Reactions  . Erythromycin     itching  . Escitalopram Other (See Comments)    insomnia  . Bupropion Itching and Other (See Comments)    insomnia Unknown Unknown insomnia     No family history on file.  Maternal grandmother with history of stroke.  Prior to Admission medications   Medication Sig Start Date End Date Taking? Authorizing Provider  aspirin EC 81 MG tablet Take 81 mg by mouth daily. Swallow whole.   Yes [provider]  calcium gluconate 500 MG tablet Take 1 tablet by mouth 2 (two) times daily.   Yes [provider]  cholecalciferol (VITAMIN D3) 25 MCG (1000 UNIT) tablet Take 1,000 Units by mouth daily.   Yes [provider]  Cinnamon 500 MG capsule Take 500 mg by mouth daily.   Yes [provider]  fenofibrate (TRICOR) 145 MG tablet Take 145 mg by mouth daily. 09/02/19  Yes [provider]  Garlic (GARLIC 1027) 1500 MG CAPS Take 1 capsule by mouth daily.   Yes [provider]  hydrochlorothiazide (MICROZIDE) 12.5  MG capsule Take 12.5 mg by mouth daily.   Yes [provider]  Levothyroxine Sodium (SYNTHROID PO) Take 1 tablet by mouth daily.    Yes [provider]  metoprolol tartrate (LOPRESSOR) 25 MG tablet Take 25 mg by mouth 2 (two) times daily.   Yes [provider]  pantoprazole (PROTONIX) 40 MG tablet Take 40 mg by mouth 2 (two)  times daily before a meal.  06/18/16  Yes [provider]  traMADol (ULTRAM) 50 MG tablet Take 50 mg by mouth 2 (two) times daily as needed for moderate pain.  05/25/19  Yes [provider]    Physical Exam: BP 133/76 (BP Location: Left Arm)   Pulse 76   Temp 97.9 F (36.6 C) (Oral)   Resp 18   SpO2 99%   . General: 79 y.o. year-old female well developed well nourished in no acute distress.  Alert and oriented x3. . Cardiovascular: Regular rate and rhythm with no rubs or gallops.  No thyromegaly or JVD noted.  No lower extremity edema. 2/4 pulses in all 4 extremities.  Grade 3 out of 6 systolic murmur. Marland Kitchen Respiratory: Clear to auscultation with no wheezes or rales. Good inspiratory effort. . Abdomen: Soft nontender nondistended with normal bowel sounds x4 quadrants. . Muskuloskeletal: No cyanosis, clubbing or edema noted bilaterally . Neuro: CN II-XII intact, strength, sensation, reflexes . Skin: No ulcerative lesions noted or rashes . Psychiatry: Judgement and insight appear normal. Mood is appropriate for condition and setting          Labs on Admission:  Basic Metabolic Panel: Recent Labs  Lab 09/09/19 0953  NA 138  K 4.7  CL 101  CO2 29  GLUCOSE 97  BUN 23  CREATININE 1.45*  CALCIUM 10.4*   Liver Function Tests: Recent Labs  Lab 09/09/19 0953  AST 35  ALT 11  ALKPHOS 37*  BILITOT 0.5  PROT 7.1  ALBUMIN 4.3   No results for input(s): LIPASE, AMYLASE in the last 168 hours. No results for input(s): AMMONIA in the last 168 hours. CBC: Recent Labs  Lab 09/09/19 0953  WBC 9.9  NEUTROABS 5.4  HGB 12.5  HCT 37.7  MCV 87.1  PLT 395   Cardiac Enzymes: No results for input(s): CKTOTAL, CKMB, CKMBINDEX, TROPONINI in the last 168 hours.  BNP (last 3 results) No results for input(s): BNP in the last 8760 hours.  ProBNP (last 3 results) No results for input(s): PROBNP in the last 8760 hours.  CBG: Recent Labs  Lab 09/09/19 0918  GLUCAP 97      Radiological Exams on Admission: CT Head Wo Contrast  Result Date: 09/09/2019 CLINICAL DATA:  Headache, blurred vision EXAM: CT HEAD WITHOUT CONTRAST TECHNIQUE: Contiguous axial images were obtained from the base of the skull through the vertex without intravenous contrast. COMPARISON:  09/04/2018 FINDINGS: Brain: Area of low-density noted in the posteromedial parietal lobe/occipital lobe on the right concerning for infarct, age indeterminate. There is atrophy and chronic small vessel disease changes. No hydrocephalus or hemorrhage. Vascular: No hyperdense vessel or unexpected calcification. Skull: No acute calvarial abnormality. Sinuses/Orbits: Visualized paranasal sinuses and mastoids clear. Orbital soft tissues unremarkable. Other: None IMPRESSION: Area low-density in the posteromedial right parietal lobe/occipital lobe concerning for age-indeterminate infarct. Atrophy, chronic small vessel disease. Electronically Signed   By: Charlett Nose M.D.   On: 09/09/2019 09:57   MR Brain W and Wo Contrast  Result Date: 09/09/2019 CLINICAL DATA:  Neuro deficit, acute, stroke suspected; brain  mass or lesion. Additional provided: Patient reports headache and blurred vision for 1 week, unable to see primary medical doctor, patient states vision grossly intact but fuzzy. EXAM: MRI HEAD WITHOUT AND WITH CONTRAST TECHNIQUE: Multiplanar, multiecho pulse sequences of the brain and surrounding structures were obtained without and with intravenous contrast. CONTRAST:  40mL GADAVIST GADOBUTROL 1 MMOL/ML IV SOLN COMPARISON:  Noncontrast head CT 09/09/2019, CT angiogram head/neck 09/04/2018. FINDINGS: Brain: Moderate generalized parenchymal atrophy 2.8 cm region of cortically based restricted diffusion within the paramedian right parietooccipital lobes and callosal splenium on the right, within the right PCA vascular territory. There is corresponding T2/FLAIR hyperintensity and enhancement at these sites. These findings are  most consistent with early subacute right PCA territory ischemic infarcts. There is a small focus of SWI signal loss within the infarction territory which may reflect mild petechial hemorrhage. Additional acute/early subacute infarcts within the right thalamus (series 2, image 24), posterior right temporal lobe (series 2, image 18) and left occipital lobe (series 2, image 16). Background mild patchy T2/FLAIR hyperintensity within the cerebral white matter is nonspecific, but consistent with chronic small vessel ischemic disease. Additional scattered supratentorial chronic microhemorrhages likely reflecting sequela of hypertensive microangiopathy. No evidence of intracranial mass. No extra-axial fluid collection. No midline shift. Vascular: No definite loss of expected proximal arterial flow voids. Skull and upper cervical spine: No focal marrow lesion. Sinuses/Orbits: Visualized orbits show no acute finding. Prior left lens replacement. Mild paranasal sinus mucosal thickening, most notably ethmoid. Trace fluid within right mastoid air cells. IMPRESSION: 2.8 cm early subacute ischemic infarct within the right PCA territory affecting the paramedian right parietooccipital lobes and callosal splenium. Suspected mild petechial hemorrhage at this site. Additional subcentimeter acute/early subacute infarcts within the right thalamus, posterior right temporal lobe and left occipital lobe. Background moderate generalized parenchymal atrophy and mild chronic small vessel ischemic disease. Mild paranasal sinus mucosal thickening. Trace fluid within right mastoid air cells. Electronically Signed   By: Jackey Loge DO   On: 09/09/2019 15:56    EKG: I independently viewed the EKG done and my findings are as followed: Sinus rhythm 83 with no specific ST-T changes.  Assessment/Plan Present on Admission: . CVA (cerebral vascular accident) [facility])  Active Problems:   CVA (cerebral vascular accident) (HCC)  CVA, acute, early  subacute Presented with severe right frontal headache associated with transient blurry vision Onset of symptoms 5 days ago MRI brain showed 2.8 cm early subacute ischemic infarct within the right PCA territory affecting the paramedian right parietooccipital lobes and callosal splenium. Suspected mild petechial hemorrhage at this site. Additional subcentimeter acute/early subacute infarcts within the right thalamus, posterior right temporal lobe and left occipital lobe.   Ongoing stroke work-up Received full dose aspirin in the ED Permissive HTN since possible acute and early subacute on MRI Start high intensity statin, Lipitor 40 mg daily Was taking baby aspirin at home, start ASA 325 mg daily or Plavix tomorrow along with her baby ASA, defer to stroke team to decide. 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 Presented with creatinine 1.45, none to compare Started gentle IV fluid hydration normal saline at 50 cc/h Monitor urine output Avoid nephrotoxins Repeat BMP in the morning  Bilateral carotid artery stenosis status post endarterectomy Reports  she has had the surgery done more than 10 years ago Obtain carotid Doppler ultrasound Start high intensity statin and continue antiplatelets  Essential hypertension BP initially not at goal Treat BP>220/120 Resume BP home medications when recommended by neurology/stroke team  Hypothyroidism Obtain TSH Resume home dose of levothyroxine, home medications are not reconciled at the time of this dictation.  Ambulatory dysfunction with history of fall She reports no recent falls Uses a walker to ambulate Will be assessed by PT OT Fall precautions   DVT  prophylaxis: SCDs  Code Status: Full code, per the patient himself.  Family Communication: Plan to call her son for an Lang Snowupdate, Jeffrey, [phone].    Consults called: Neurology, stroke team, consulted by EDP.  Admission status: Observation status   Status is: Observation    Dispo:  Patient From: Home  Planned Disposition: Home  Expected discharge date: 09/10/19  Medically stable for discharge: No   Darlin Droparole N Franca Stakes MD [facility] Pager [phone]  If 7PM-7AM, please contact night-coverage [email or web address] Password [email or web address]  09/09/2019, 5:35 PM

## 2019-09-09 NOTE — Consult Note (Signed)
Neurology Consultation Reason for Consult: Blurred vision Referring Physician: Margo Aye, C  CC: Blurred vision  History is obtained from: Patient  HPI: Karina [name] is a 79 y.o. female who presents with a 1 week history of blurred vision.  She states that she has also had some mild right-sided headaches.  The symptoms of been present, and due to the fact that they were not getting better she decided to seek care in the emergency room at [facility] where a CT was performed demonstrating hypodensity concerning for acute infarct.  She was transferred to [facility] for MRI which demonstrates multifocal ischemia, predominantly in the posterior circulation on the right.   LKW: 1 week ago tpa given?: no, outside of window    ROS: A 14 point ROS was performed and is negative except as noted in the HPI.  Past Medical History:  Diagnosis Date  . GERD (gastroesophageal reflux disease)   . Hypertension   . Thyroid disease     Family history: Grandfather-stroke    Social History:  reports that she has never smoked. She has never used smokeless tobacco. She reports that she does not drink alcohol and does not use drugs.   Exam: Current vital signs: BP 133/76 (BP Location: Left Arm)   Pulse 76   Temp 97.9 F (36.6 C) (Oral)   Resp 18   SpO2 99%  Vital signs in last 24 hours: Temp:  [97.7 F (36.5 C)-97.9 F (36.6 C)] 97.9 F (36.6 C) (08/05 1253) Pulse Rate:  [64-90] 76 (08/05 1645) Resp:  [16-18] 18 (08/05 1645) BP: (133-187)/(67-110) 133/76 (08/05 1645) SpO2:  [97 %-99 %] 99 % (08/05 1645)   Physical Exam  Constitutional: Appears well-developed and well-nourished.  Psych: Affect appropriate to situation Eyes: No scleral injection HENT: No OP obstrucion MSK: no joint deformities.  Cardiovascular: Normal rate and regular rhythm.  Respiratory: Effort normal, non-labored breathing GI: Soft.  No distension. There is no tenderness.  Skin: WDI  Neuro: Mental  Status: Patient is awake, alert, oriented to person, place, month, year, and situation. Patient is able to give a clear and coherent history. No signs of aphasia or neglect Cranial Nerves: II: Visual Fields are full to confrontation. Pupils are equal, round, and reactive to light.   III,IV, VI: EOMI without ptosis or diploplia.  V: Facial sensation is symmetric to temperature VII: Facial movement is symmetric.  VIII: hearing is intact to voice X: Uvula elevates symmetrically XI: Shoulder shrug is symmetric. XII: tongue is midline without atrophy or fasciculations.  Motor: Tone is normal. Bulk is normal. 5/5 strength was present in all four extremities.  Sensory: Sensation is symmetric to light touch and temperature in the arms and legs. Deep Tendon Reflexes: 2+ and symmetric in the biceps and patellae.  Plantars: Toes are downgoing bilaterally.  Cerebellar: FNF and HKS are intact bilaterally      I have reviewed labs in epic and the results pertinent to this consultation are: Creatinine 1.45  I have reviewed the images obtained:MRI brain - ischemic infarct with mild pethechial hemorrhage.   Impression: 79 year old female with what appears to be a posterior circulation infarct, I do wonder if the thalamic infarct could actually be posterior circulation as well making this a single event.  She will need a full embolic work-up.  She has mild renal insufficiency, and already got contrast for her earlier MRI, will give an MRA head and neck without contrast.  Recommendations: - HgbA1c, fasting lipid panel -  MRA  of the brain and neck without contrast - Frequent neuro checks - Echocardiogram - Prophylactic therapy-Antiplatelet med: Aspirin - dose 325mg  PO or 300mg  PR - Risk factor modification - Telemetry monitoring - PT consult, OT consult, Speech consult - Stroke team to follow    , MD [facility] [phone]  If 7pm- 7am, please page  neurology on call as listed in AMION.

## 2019-09-09 NOTE — ED Provider Notes (Signed)
Patient transferred from [facility] for MRI.  Patient reports he is been having headaches with vision changes.  Patient had CT scan concerning for possible stroke.  She was sent for MRI with and without to further evaluate.  Anticipate discussion with neurology after imaging to determine disposition.  4:01 PM MRI does show acute stroke.  I spoke to neurology and they recommend admission to the medicine service and they will see her in consultation.  Medicine team called for admission for new stroke causing the transient headache and vision changes this week.  Clinical Impression: 1. Cerebrovascular accident (CVA), unspecified mechanism (HCC)     Disposition: Admit  This note was prepared with assistance of Dragon voice recognition software. Occasional wrong-word or sound-a-like substitutions may have occurred due to the inherent limitations of voice recognition software.     June Rode, Canary Brim, MD 09/09/19 [phone]

## 2019-09-09 NOTE — ED Provider Notes (Signed)
[facility] EMERGENCY DEPARTMENT Provider Note   CSN: [id number] Arrival date & time: 09/09/19  4098     History Chief Complaint  Patient presents with  . Headache  . Blurred Vision    Karina [name] is a 79 y.o. female.  HPI 79 year old female presents with headache.  Ongoing for about a week.  She is not sure how it started but the headache is now intermittent.  It is right frontal.  Nothing specific causes it to come.  Some over-the-counter medicine has helped with the headache.  She has noticed a little bit of blurry vision with both eyes since it started.  No fevers, neck pain, vomiting, weakness/numbness.  No trauma. At its worst, pain is about an 8/10. Currently no pain.   Past Medical History:  Diagnosis Date  . GERD (gastroesophageal reflux disease)   . Hypertension   . Thyroid disease     There are no problems to display for this patient.   Past Surgical History:  Procedure Laterality Date  . ABDOMINAL HYSTERECTOMY    . APPENDECTOMY    . CHOLECYSTECTOMY    . EYE SURGERY       OB History   No obstetric history on file.     No family history on file.  Social History   Tobacco Use  . Smoking status: Never Smoker  . Smokeless tobacco: Never Used  Substance Use Topics  . Alcohol use: Never  . Drug use: Never    Home Medications Prior to Admission medications   Medication Sig Start Date End Date Taking? Authorizing Provider  Levothyroxine Sodium (SYNTHROID PO) Take by mouth.   Yes [provider]  Metoprolol-hydroCHLOROthiazide (METOPROLOL-HCTZ ER PO) Take by mouth.   Yes [provider]  pantoprazole (PROTONIX) 40 MG tablet Take by mouth. 06/18/16  Yes [provider]  raNITIdine HCl (RANITIDINE 75 PO) Take by mouth.   Yes [provider]  fenofibrate (TRICOR) 145 MG tablet Take 145 mg by mouth daily. 09/02/19   [provider]  traMADol (ULTRAM) 50 MG tablet Take 50 mg by mouth 2 (two) times daily  as needed. 05/25/19   [provider]    Allergies    Erythromycin  Review of Systems   Review of Systems  Constitutional: Negative for fever.  Eyes: Positive for visual disturbance.  Cardiovascular: Negative for chest pain.  Gastrointestinal: Negative for vomiting.  Musculoskeletal: Negative for neck pain.  Neurological: Positive for headaches. Negative for weakness and numbness.  All other systems reviewed and are negative.   Physical Exam Updated Vital Signs BP (!) 154/110 (BP Location: Right Arm)   Pulse 64   Temp 97.7 F (36.5 C) (Oral)   Resp 16   SpO2 98%   Physical Exam Vitals and nursing note reviewed.  Constitutional:      General: She is not in acute distress.    Appearance: She is well-developed. She is not ill-appearing or diaphoretic.  HENT:     Head: Normocephalic and atraumatic.     Comments: No frontal/temporal tenderness    Right Ear: External ear normal.     Left Ear: External ear normal.     Nose: Nose normal.  Eyes:     General:        Right eye: No discharge.        Left eye: No discharge.     Extraocular Movements: Extraocular movements intact.     Pupils: Pupils are equal, round, and reactive to light.  Comments: Normal visual fields  Cardiovascular:     Rate and Rhythm: Normal rate and regular rhythm.     Heart sounds: Normal heart sounds.  Pulmonary:     Effort: Pulmonary effort is normal.     Breath sounds: Normal breath sounds.  Abdominal:     Palpations: Abdomen is soft.     Tenderness: There is no abdominal tenderness.  Musculoskeletal:     Cervical back: Normal range of motion and neck supple.  Skin:    General: Skin is warm and dry.  Neurological:     Mental Status: She is alert.     Comments: CN 3-12 grossly intact. 5/5 strength in all 4 extremities. Grossly normal sensation. Normal finger to nose.   Psychiatric:        Mood and Affect: Mood is not anxious.     ED Results / Procedures / Treatments    Labs (all labs ordered are listed, but only abnormal results are displayed) Labs Reviewed  COMPREHENSIVE METABOLIC PANEL - Abnormal; Notable for the following components:      Result Value   Creatinine, Ser 1.45 (*)    Calcium 10.4 (*)    Alkaline Phosphatase 37 (*)    GFR calc non Af Amer 35 (*)    GFR calc Af Amer 40 (*)    All other components within normal limits  CBC WITH DIFFERENTIAL/PLATELET - Abnormal; Notable for the following components:   Eosinophils Absolute 0.7 (*)    All other components within normal limits  SEDIMENTATION RATE  CBG MONITORING, ED    EKG EKG Interpretation  Date/Time:  Thursday September 09 2019 11:13:28 EDT Ventricular Rate:  83 PR Interval:    QRS Duration: 86 QT Interval:  371 QTC Calculation: 436 R Axis:   41 Text Interpretation: Sinus rhythm no acute ST/T changes No old tracing to compare Confirmed by Pricilla Loveless [id number]) on 09/09/2019 11:17:16 AM   Radiology CT Head Wo Contrast  Result Date: 09/09/2019 CLINICAL DATA:  Headache, blurred vision EXAM: CT HEAD WITHOUT CONTRAST TECHNIQUE: Contiguous axial images were obtained from the base of the skull through the vertex without intravenous contrast. COMPARISON:  09/04/2018 FINDINGS: Brain: Area of low-density noted in the posteromedial parietal lobe/occipital lobe on the right concerning for infarct, age indeterminate. There is atrophy and chronic small vessel disease changes. No hydrocephalus or hemorrhage. Vascular: No hyperdense vessel or unexpected calcification. Skull: No acute calvarial abnormality. Sinuses/Orbits: Visualized paranasal sinuses and mastoids clear. Orbital soft tissues unremarkable. Other: None IMPRESSION: Area low-density in the posteromedial right parietal lobe/occipital lobe concerning for age-indeterminate infarct. Atrophy, chronic small vessel disease. Electronically Signed   By: Charlett Nose M.D.   On: 09/09/2019 09:57    Procedures Procedures (including critical care  time)  Medications Ordered in ED Medications  sodium chloride 0.9 % bolus 1,000 mL (1,000 mLs Intravenous New Bag/Given 09/09/19 1003)  aspirin chewable tablet 324 mg (324 mg Oral Given 09/09/19 1106)    ED Course  I have reviewed the triage vital signs and the nursing notes.  Pertinent labs & imaging results that were available during my care of the patient were reviewed by me and considered in my medical decision making (see chart for details).    MDM Rules/Calculators/A&P                          CT shows possible subacute right parietal/occipital infarct.  This would go along with her symptoms.  I  discussed with Dr. Amada Jupiter, given size/location he feels its necessary to get MRI prior to admission to rule out that this is mass instead.  Recommends MRI with and without.  This is not available at this facility and so she will be transferred to [facility], ER for this procedure.  If positive for stroke needs admission.  Discussed with EDP, Dr. Jacqulyn Bath, who accepts in transfer.  Labs otherwise fairly unremarkable besides mildly elevated creatinine and mildly elevated calcium.  She was given IV fluids. Final Clinical Impression(s) / ED Diagnoses Final diagnoses:  None    Rx / DC Orders ED Discharge Orders    None       Pricilla Loveless, MD 09/09/19 [phone]

## 2019-09-09 NOTE — Progress Notes (Signed)
Updated the patient's son Tinnie Gens via phone.  All questions answered.

## 2019-09-10 ENCOUNTER — Observation Stay (HOSPITAL_COMMUNITY): Payer: Medicare HMO

## 2019-09-10 ENCOUNTER — Observation Stay (HOSPITAL_BASED_OUTPATIENT_CLINIC_OR_DEPARTMENT_OTHER): Payer: Medicare HMO

## 2019-09-10 ENCOUNTER — Encounter (HOSPITAL_COMMUNITY): Payer: Medicare HMO

## 2019-09-10 ENCOUNTER — Encounter (HOSPITAL_COMMUNITY): Payer: Self-pay | Admitting: Internal Medicine

## 2019-09-10 ENCOUNTER — Encounter (HOSPITAL_COMMUNITY)
Admission: EM | Disposition: A | Discharge status: Home / Self Care | Payer: Self-pay | Source: Home / Self Care | Attending: Emergency Medicine

## 2019-09-10 DIAGNOSIS — I639 Cerebral infarction, unspecified: Secondary | ICD-10-CM | POA: Diagnosis not present

## 2019-09-10 DIAGNOSIS — I361 Nonrheumatic tricuspid (valve) insufficiency: Secondary | ICD-10-CM

## 2019-09-10 DIAGNOSIS — I6389 Other cerebral infarction: Secondary | ICD-10-CM | POA: Diagnosis not present

## 2019-09-10 DIAGNOSIS — I351 Nonrheumatic aortic (valve) insufficiency: Secondary | ICD-10-CM | POA: Diagnosis not present

## 2019-09-10 DIAGNOSIS — I10 Essential (primary) hypertension: Secondary | ICD-10-CM | POA: Diagnosis not present

## 2019-09-10 HISTORY — PX: LOOP RECORDER INSERTION: EP1214

## 2019-09-10 LAB — ECHOCARDIOGRAM COMPLETE
Area-P 1/2: 3.21 cm2
P 1/2 time: 451 msec
S' Lateral: 3 cm

## 2019-09-10 LAB — CBC
HCT: 33.8 % — ABNORMAL LOW (ref 36.0–46.0)
Hemoglobin: 11 g/dL — ABNORMAL LOW (ref 12.0–15.0)
MCH: 28.3 pg (ref 26.0–34.0)
MCHC: 32.5 g/dL (ref 30.0–36.0)
MCV: 86.9 fL (ref 80.0–100.0)
Platelets: 370 10*3/uL (ref 150–400)
RBC: 3.89 MIL/uL (ref 3.87–5.11)
RDW: 14.6 % (ref 11.5–15.5)
WBC: 7.5 10*3/uL (ref 4.0–10.5)
nRBC: 0 % (ref 0.0–0.2)

## 2019-09-10 LAB — BASIC METABOLIC PANEL
Anion gap: 12 (ref 5–15)
BUN: 19 mg/dL (ref 8–23)
CO2: 26 mmol/L (ref 22–32)
Calcium: 9.2 mg/dL (ref 8.9–10.3)
Chloride: 102 mmol/L (ref 98–111)
Creatinine, Ser: 1.27 mg/dL — ABNORMAL HIGH (ref 0.44–1.00)
GFR calc Af Amer: 47 mL/min — ABNORMAL LOW (ref >60)
GFR calc non Af Amer: 41 mL/min — ABNORMAL LOW (ref >60)
Glucose, Bld: 91 mg/dL (ref 70–99)
Potassium: 3.4 mmol/L — ABNORMAL LOW (ref 3.5–5.1)
Sodium: 140 mmol/L (ref 135–145)

## 2019-09-10 LAB — GLUCOSE, CAPILLARY: Glucose-Capillary: 106 mg/dL — ABNORMAL HIGH (ref 70–99)

## 2019-09-10 IMAGING — MR MR MRA HEAD W/O CM
1 series · 20 of 48 positions shown · non-contrast
Comparison: Brain MRI from yesterday

CLINICAL DATA: Stroke follow-up

EXAM:
MRA HEAD WITHOUT CONTRAST
TECHNIQUE: Angiographic images of the Circle of Willis were obtained using MRA
technique without intravenous contrast.

[Series 6: 3d cow · axial · 0.5mm · 0.41mm/px · z∈[-89,-1]mm · 20 of 188 slices shown]
[im 1/188]
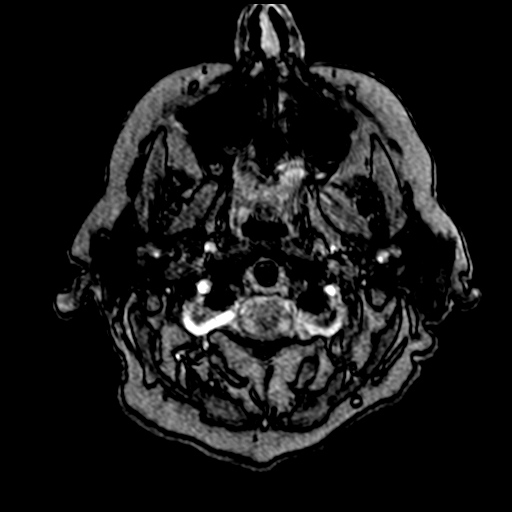
[im 4/188]
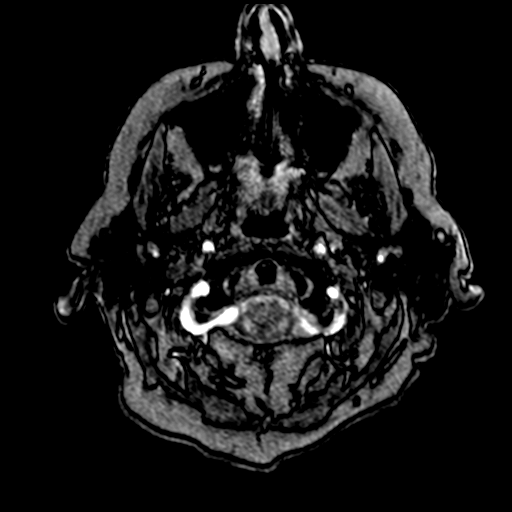
[im 8/188]
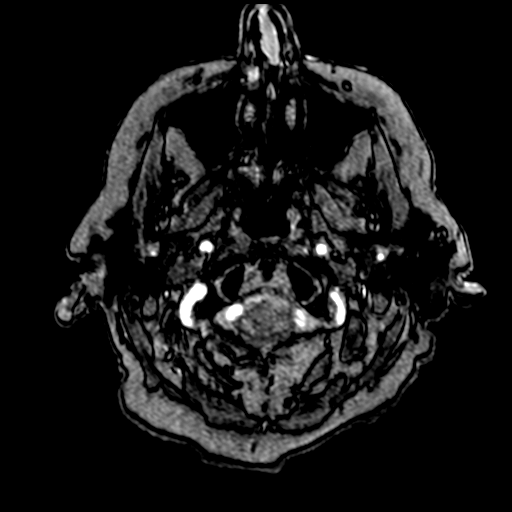
[im 12/188]
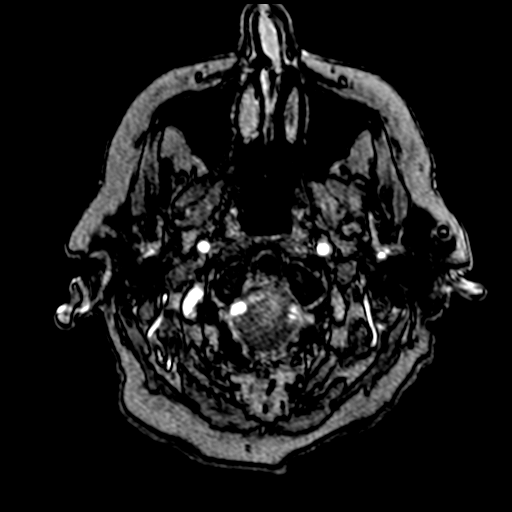
[im 16/188]
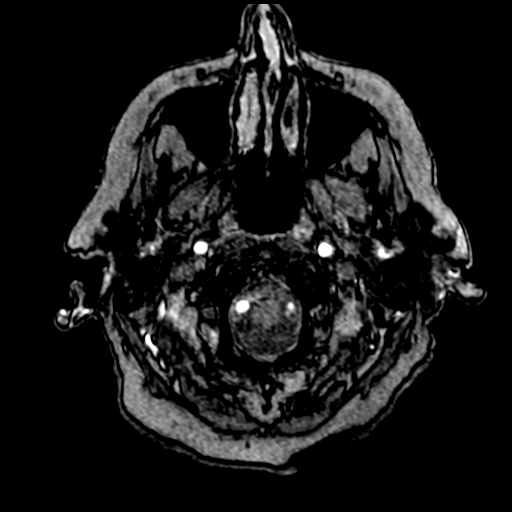
[im 20/188]
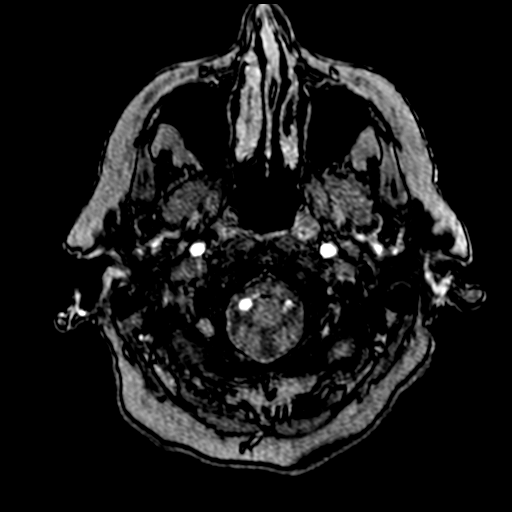
[im 24/188]
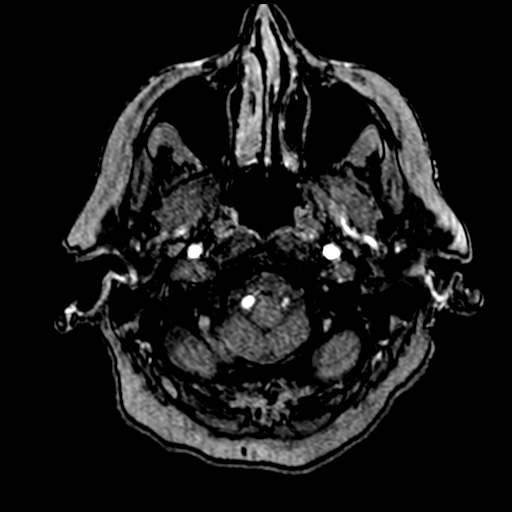
[im 28/188]
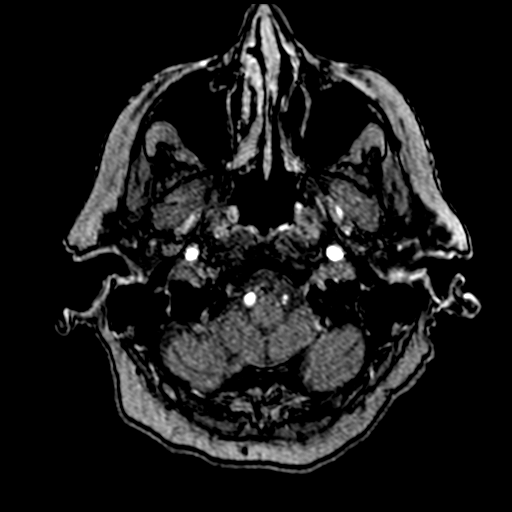
[im 32/188]
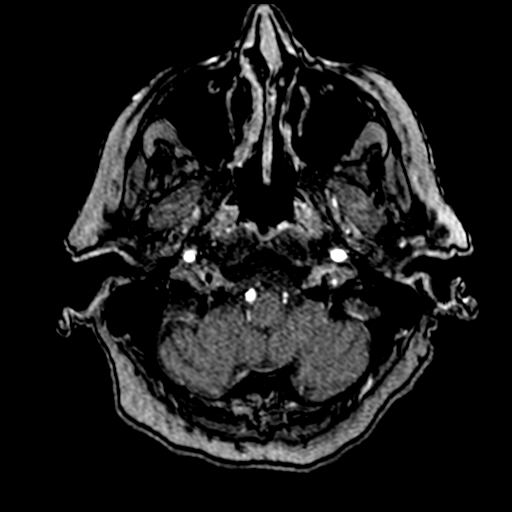
[im 36/188]
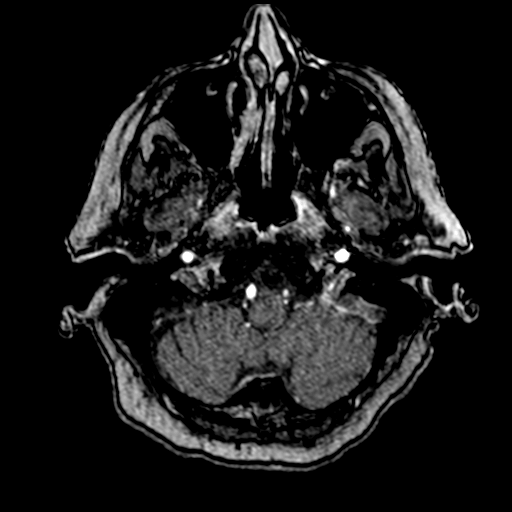
[im 40/188]
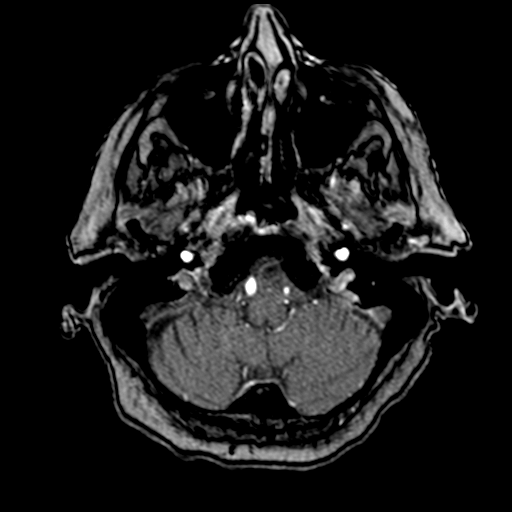
[im 44/188]
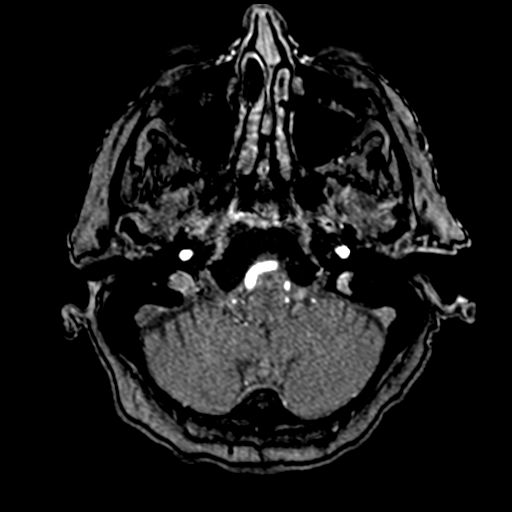
[im 60/188]
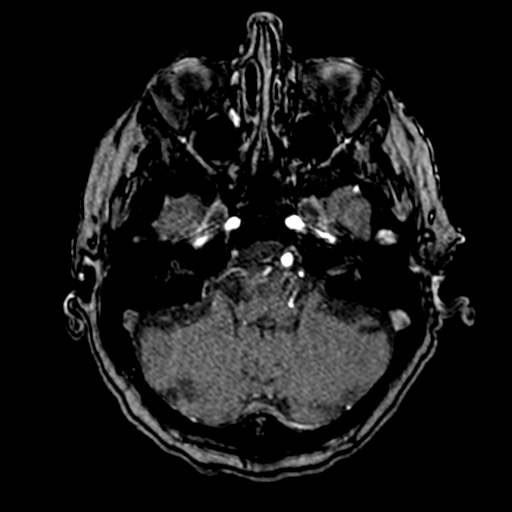
[im 84/188]
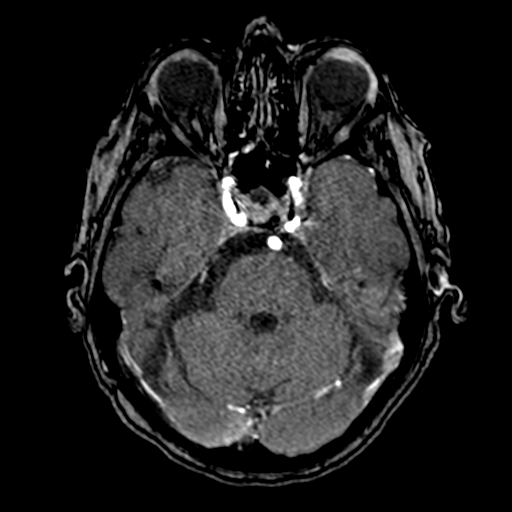
[im 96/188]
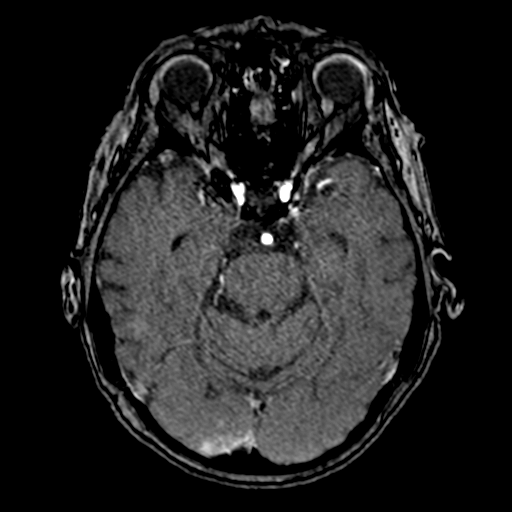
[im 108/188]
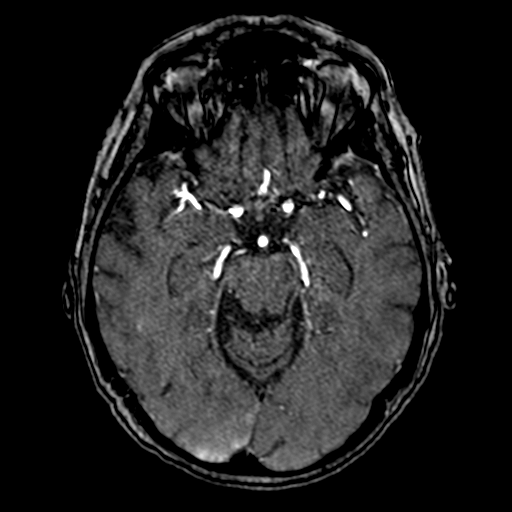
[im 132/188]
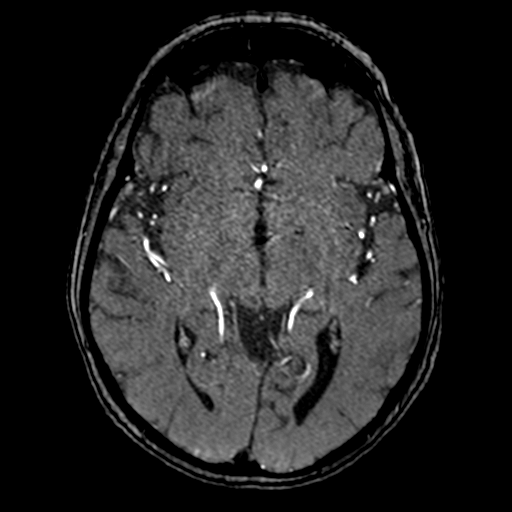
[im 156/188]
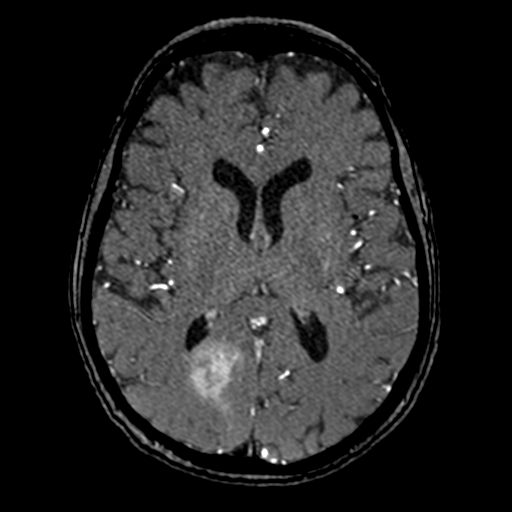
[im 160/188]
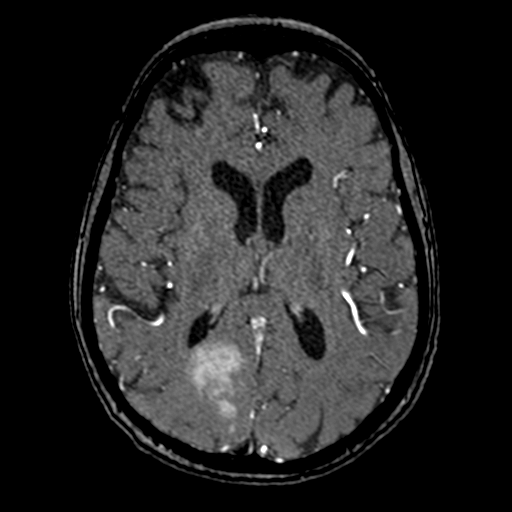
[im 180/188]
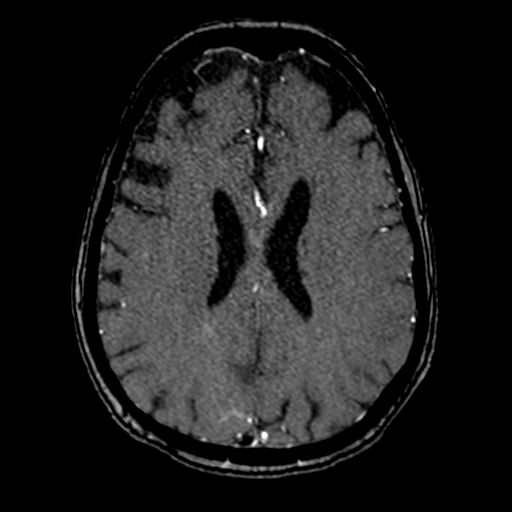

[20 of 48 positions shown; findings below may reference images not displayed]

FINDINGS: Right dominant vertebral artery. Dominant left PICA and right AICA.
Hypoplastic right A1 segment.

No branch occlusion, generalized beading, or aneurysm. Atheromatous
irregularity/narrowing seen to the bilateral ICA, M1 segments, right
proximal A2 segment, and especially to the left distal A2 segment
where there is high-grade narrowing. Mild atheromatous changes
likely present at the bilateral P4 level.

Patchy T1 hyperintensity in the right occipital cortex at sites of
acute and subacute infarction (at the occipital pole there is
infarct changes by MRI that are not diffusion hyperintense). No
discrete hematoma
IMPRESSION: 1. No emergent vascular finding. Symmetric, robust flow in the right
PCA.
2. Intracranial atherosclerosis as described above. No flow reducing
and correctable narrowing.

## 2019-09-10 IMAGING — MR MR MRA NECK W/O CM
5 of 6 series · 38 of 48 positions shown · non-contrast
Comparison: None.

CLINICAL DATA: Occipital infarct on the right.

EXAM:
MRA NECK WITHOUTCONTRAST
TECHNIQUE: Angiographic images of the neck were obtained using MRA technique
without intravenous contrast.

[Series 10: tof_fl3d_tra_iso · axial · 0.6mm · 0.52mm/px · z∈[-216,-127]mm · 20 of 159 slices shown]
[im 1/159]
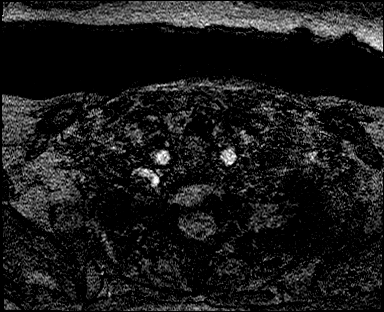
[im 8/159]
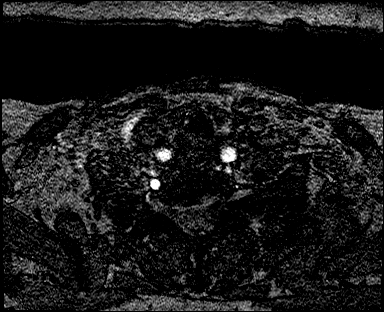
[im 15/159]
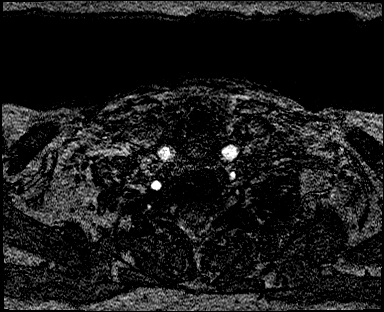
[im 22/159]
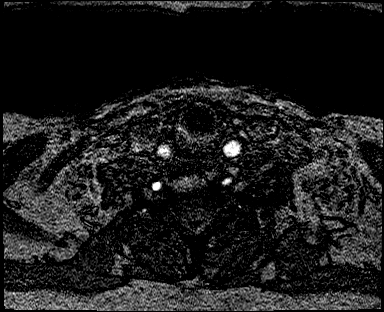
[im 29/159]
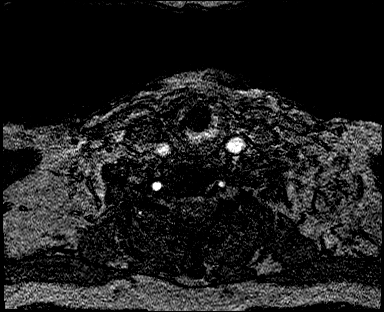
[im 36/159]
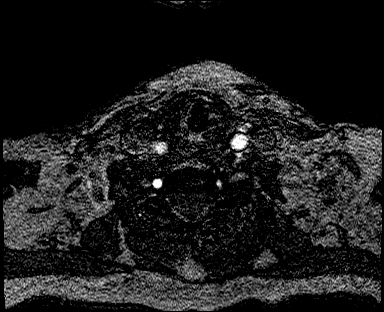
[im 44/159]
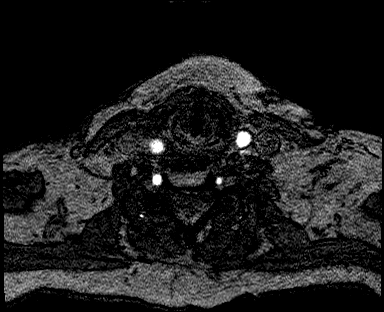
[im 51/159]
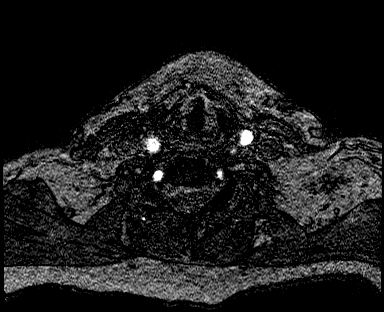
[im 58/159]
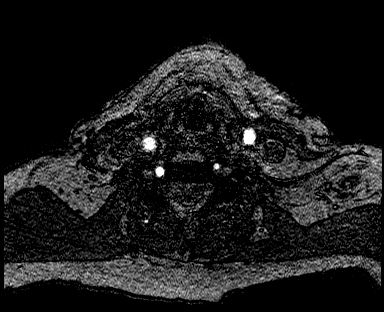
[im 65/159]
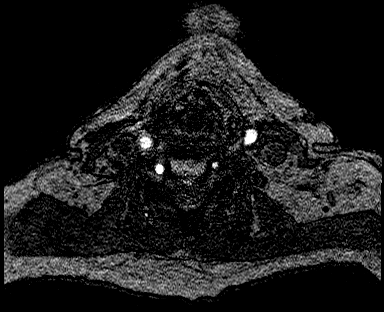
[im 72/159]
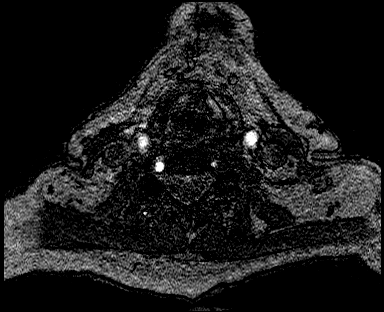
[im 80/159]
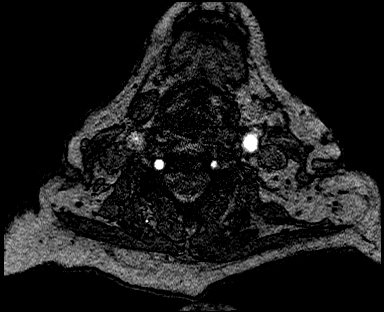
[im 87/159]
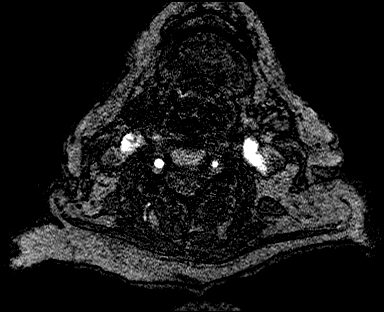
[im 94/159]
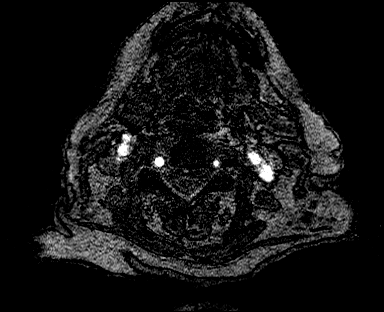
[im 101/159]
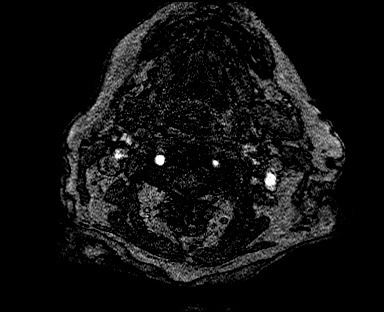
[im 108/159]
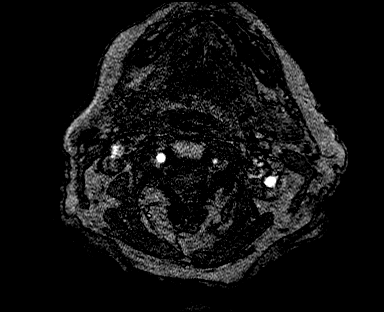
[im 115/159]
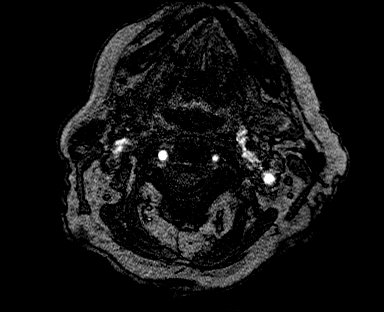
[im 130/159]
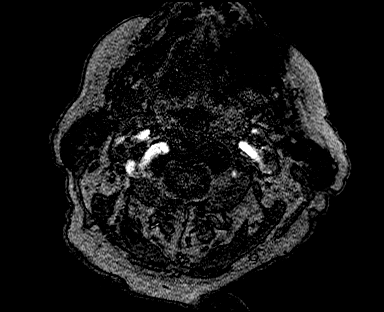
[im 137/159]
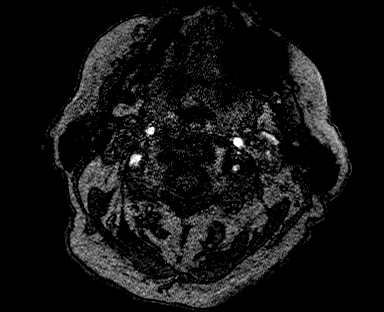
[im 151/159]
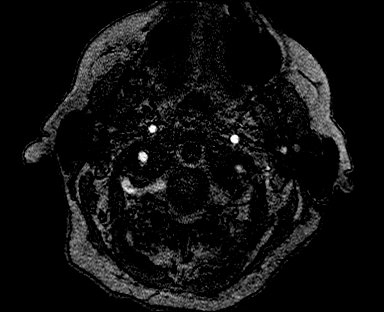

[Series 19: DIXON fat-sat · axial · 3.0mm · 0.39mm/px · z∈[-255,-113]mm · 6 of 45 slices shown]
[im 1/45]
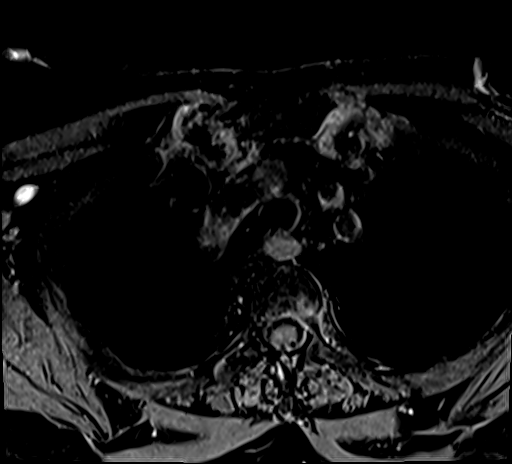
[im 9/45]
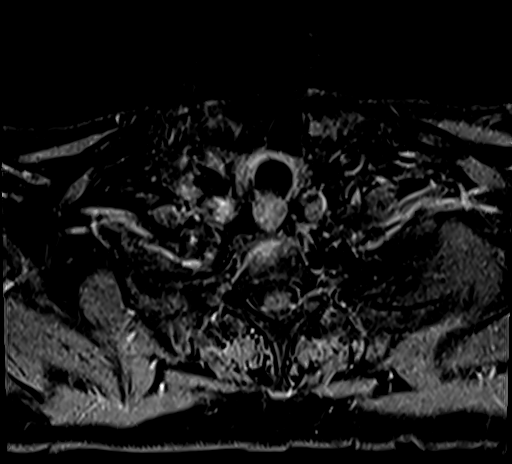
[im 18/45]
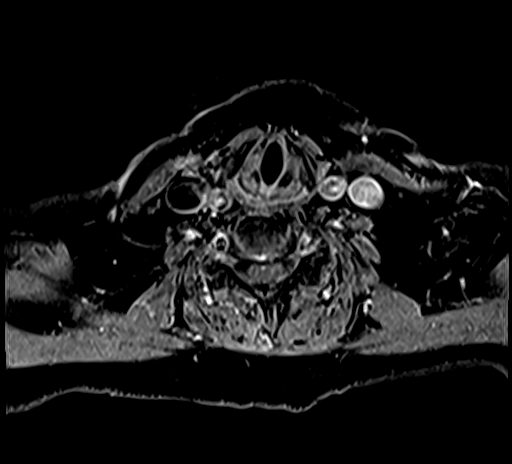
[im 27/45]
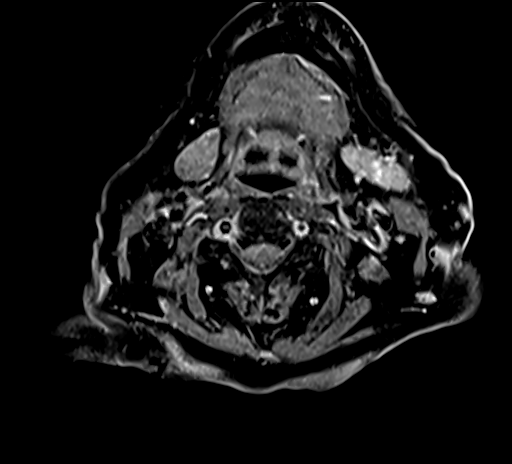
[im 36/45]
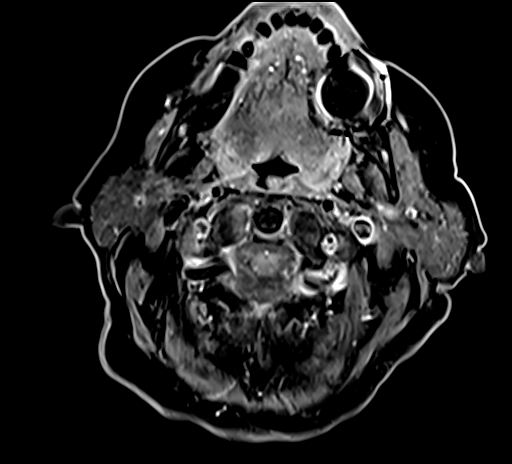
[im 45/45]
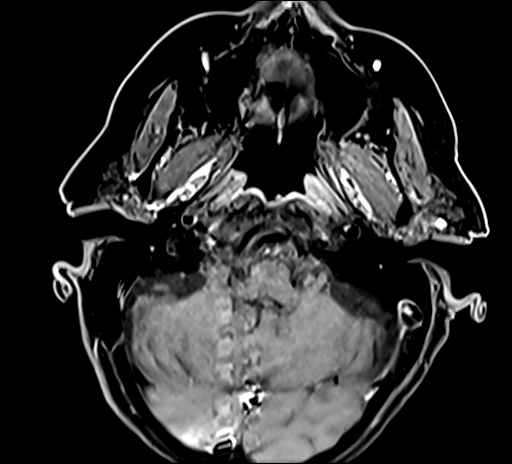

[Series 20: tof_fl2d_tra · axial · 3.0mm · 0.78mm/px · z∈[-268,-57]mm · 10 of 114 slices shown]
[im 8/114]
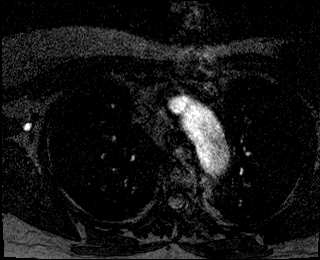
[im 16/114]
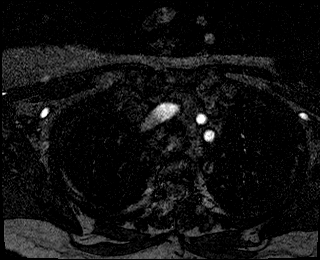
[im 23/114]
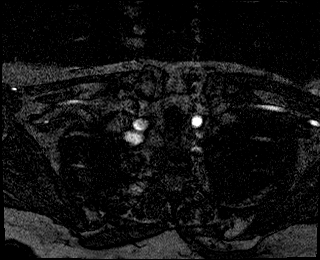
[im 38/114]
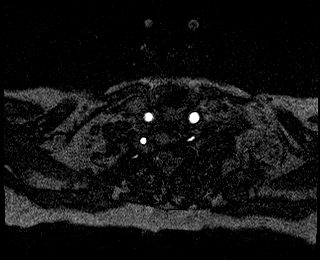
[im 53/114]
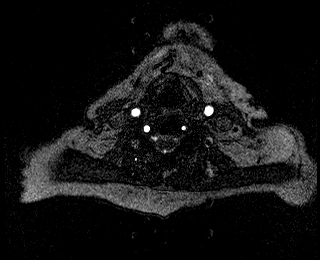
[im 61/114]
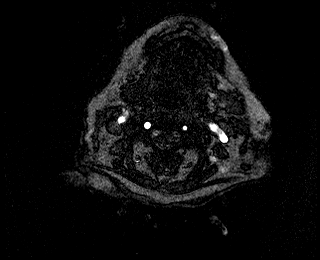
[im 68/114]
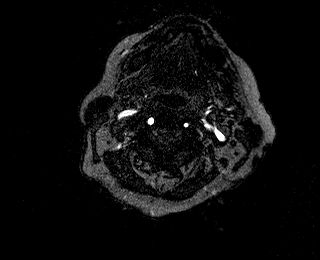
[im 83/114]
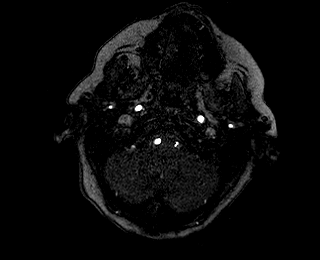
[im 98/114]
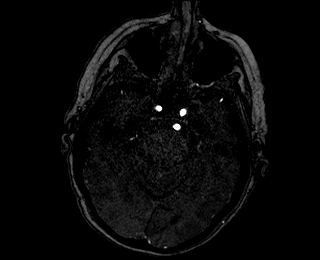
[im 114/114]
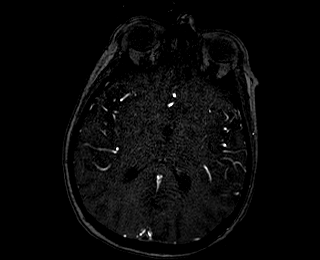

[Series 23: tof_fl2d_tra_mip_tra · axial · 230.1mm · 0.78mm/px · 1 of 1 slices shown]
[im 1/1]
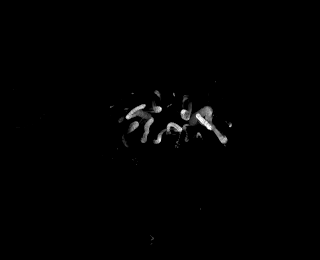

[Series 24: tof_fl2d_tra_mip_radials · axial · 0.78mm/px · 1 of 2 slices shown]
[im 1/2]
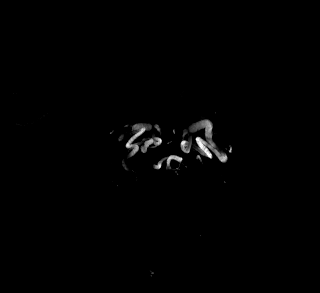

[38 of 48 positions shown; findings below may reference images not displayed]

FINDINGS: Normal appearance of the covered arch.  Three vessel branching.

Turbulent flow at the right more than left ICA bifurcation
attributed to atherosclerosis. Quantification at right ICA stenosis
is limited due to degree of artifact and signal dropout.

No evidence of proximal subclavian stenosis. The right vertebral
artery is dominant. Both vertebral arteries show robust flow to the
dura. Early branching left PICA.
IMPRESSION: 1. No flow reducing stenosis or embolic source seen in the posterior
circulation.
2. Atheromatous stenosis and turbulent flow at the right cervical
carotid bifurcation with quantification limited by the degree of
artifact.

## 2019-09-10 SURGERY — LOOP RECORDER INSERTION

## 2019-09-10 MED ORDER — FENOFIBRATE 54 MG PO TABS: 54.0000 mg | ORAL_TABLET | Freq: Every day | ORAL | 2 refills | Status: AC

## 2019-09-10 MED ORDER — ATORVASTATIN CALCIUM 40 MG PO TABS: 40.0000 mg | ORAL_TABLET | Freq: Every day | ORAL | 2 refills | Status: AC

## 2019-09-10 MED ORDER — CLOPIDOGREL BISULFATE 75 MG PO TABS
75.0000 mg | ORAL_TABLET | Freq: Every day | ORAL | Status: DC
  Administered 2019-09-10: 75 mg via ORAL
  Filled 2019-09-10: qty 1

## 2019-09-10 MED ORDER — ASPIRIN 81 MG PO TBEC: 81.0000 mg | DELAYED_RELEASE_TABLET | Freq: Every day | ORAL | 0 refills | Status: DC

## 2019-09-10 MED ORDER — LIDOCAINE-EPINEPHRINE 1 %-1:100000 IJ SOLN
INTRAMUSCULAR | Status: AC
  Filled 2019-09-10: qty 1

## 2019-09-10 MED ORDER — CLOPIDOGREL BISULFATE 75 MG PO TABS: 75.0000 mg | ORAL_TABLET | Freq: Every day | ORAL | 3 refills | Status: DC

## 2019-09-10 MED ORDER — ASPIRIN EC 81 MG PO TBEC: 81.0000 mg | DELAYED_RELEASE_TABLET | Freq: Every day | ORAL | Status: DC

## 2019-09-10 MED ORDER — STROKE: EARLY STAGES OF RECOVERY BOOK
Status: AC
  Filled 2019-09-10: qty 1

## 2019-09-10 MED ORDER — POTASSIUM CHLORIDE CRYS ER 20 MEQ PO TBCR
40.0000 meq | EXTENDED_RELEASE_TABLET | Freq: Once | ORAL | Status: AC
  Administered 2019-09-10: 40 meq via ORAL
  Filled 2019-09-10: qty 2

## 2019-09-10 MED ORDER — LIDOCAINE-EPINEPHRINE 1 %-1:100000 IJ SOLN
INTRAMUSCULAR | Status: DC | PRN
  Administered 2019-09-10: 30 mL

## 2019-09-10 MED ORDER — CHLORHEXIDINE GLUCONATE CLOTH 2 % EX PADS
6.0000 | MEDICATED_PAD | Freq: Every day | CUTANEOUS | Status: DC
  Administered 2019-09-10: 6 via TOPICAL

## 2019-09-10 MED FILL — Labetalol HCl IV Soln Prefilled Syringe 20 MG/4ML (5 MG/ML): INTRAVENOUS | Qty: 4 | Status: AC

## 2019-09-10 SURGICAL SUPPLY — 2 items
MONITOR REVEAL LINQ II (Prosthesis & Implant Heart) ×3 IMPLANT
PACK LOOP INSERTION (CUSTOM PROCEDURE TRAY) ×3 IMPLANT

## 2019-09-10 NOTE — Progress Notes (Signed)
  Carotid duplex has been completed.   Preliminary results in CV Proc.   Blanch Media 09/10/2019 7:20 PM

## 2019-09-10 NOTE — Plan of Care (Signed)
°  Problem: Activity: °Goal: Risk for activity intolerance will decrease °Outcome: Progressing °  °Problem: Nutrition: °Goal: Adequate nutrition will be maintained °Outcome: Progressing °  °Problem: Skin Integrity: °Goal: Risk for impaired skin integrity will decrease °Outcome: Progressing °  °

## 2019-09-10 NOTE — Progress Notes (Signed)
Pt had order for D/C on day shift that was cancelled later by Dr Sharl Ma, however pt was already told she was going home, on call Dr Loney Loh was paged but said she cannot discharge the pt, family getting upset insisting that they have to leave tonight, the [facility] Onalee Hua was called, he said will try to reach Dr Sharl Ma at home, [facility] later called me back and said Dr Sharl Ma was trying to wait for pt's Carotid Duplex results but he however asked him to put in the discharge order for today, same explained to pt and family at bedside. Obasogie-Asidi, Rechelle Niebla Efe

## 2019-09-10 NOTE — Discharge Summary (Signed)
Physician Discharge Summary  [name] ZOX:[id number] DOB: [birth year] DOA: 09/09/2019  PCP: Brooke Bonito, MD  Admit date: 09/09/2019 Discharge date: 09/10/2019  Time spent: 50 minutes  Recommendations for Outpatient Follow-up:  1. Follow-up neurology in 4 weeks 2. Follow-up cardiology on 09/13/2019  Discharge Diagnoses:  Active Problems:   CVA (cerebral vascular accident) [name])   Discharge Condition: Stable  Diet recommendation: Heart healthy diet  There were no vitals filed for this visit.  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  Patient came with right-sided frontal headache with onset 5 days ago this was associated with blurred vision.  At [facility] ED CT of the head showed area of low density in the posteromedial right parietal lobe/occipital lobe concerning for age-indeterminate infarct.  She was transferred to [name] ED for MRI brain.  Neurology/stroke team was consulted. MRI brain showed 2.8 cm early subacute ischemic infarct within the right PCA territory affecting the paramedian right parietal occipital lobes and callosal splenium.  Suspect mild petechial hemorrhage at this time.  Additional subcentimeter acute/early subacute infarct in the right thalamus, posterior right temporal lobe and left occipital lobe.  Hospital Course:  1. Posterior circulation infarct-continue aspirin, Plavix, atorvastatin, fenofibrate,  2D echo showed no cardiac embolic source.  Carotid duplex will be obtained.  Neurology recommends to continue with aspirin 81 mg daily for [redacted] weeks along with Plavix 75 mg daily.  After 3 weeks stop  taking aspirin and continue with Plavix.  Continue statin and fenofibrate 54 mg daily.  Patient had loop recorder placed by cardiology/EP to check for atrial fibrillation.  Patient has an appointment to see cardiology on 09/23/2019. 2. Acute kidney injury-unknown baseline, creatinine improved from 1.45-1.27.   3. Bilateral carotid artery stenosis s/p endarterectomy-patient has had surgery 10 years ago.  Will obtain carotid ultrasound before discharge. 4. Essential hypertension-patient can start taking metoprolol from tomorrow and hydrochlorothiazide from Monday. 5. Hypokalemia-potassium was 3.4, will replace potassium  Hypothyroidism-continue levothyroxine.  TSH 0.740.  Procedures:  Echocardiogram    Consultations:  Neurology  Discharge Exam: Vitals:   09/10/19 1500 09/10/19 1722  BP: (!) 180/70 (!) 136/54  Pulse: 94 95  Resp: 18 18  Temp: 98.4 F (36.9 C) 98.2 F (36.8 C)  SpO2: 99% 96%     Discharge Instructions   Discharge Instructions    Diet - low sodium heart healthy   Complete by: As directed    Discharge instructions   Complete by: As directed    Take aspirin and Plavix together for 3 weeks, then stop aspirin after 3 weeks and just take Plavix daily.  Start taking metoprolol from 09/11/2019, start taking hydrochlorothiazide from 09/13/2019   Increase activity slowly   Complete by: As directed      Allergies as of 09/10/2019      Reactions   Erythromycin    itching   Escitalopram Other (See Comments)   insomnia   Bupropion Itching, Other (See Comments)   insomnia Unknown Unknown insomnia      Medication List    TAKE these medications   aspirin 81 MG EC tablet Take 1 tablet (81 mg total) by mouth daily for 21 days. Swallow whole. Start taking on:  September 11, 2019   atorvastatin 40 MG tablet Commonly known as: LIPITOR Take 1 tablet (40 mg total) by mouth daily. Start taking on: September 11, 2019   calcium gluconate 500 MG tablet Take 1 tablet by mouth 2  (two) times daily.   cholecalciferol 25 MCG (1000 UNIT) tablet Commonly known as: VITAMIN D3 Take 1,000 Units by mouth daily.   Cinnamon 500 MG capsule Take 500 mg by mouth daily.   clopidogrel 75 MG tablet Commonly known as: PLAVIX Take 1 tablet (75 mg total) by mouth daily. Start taking on: September 11, 2019   fenofibrate 54 MG tablet Take 1 tablet (54 mg total) by mouth daily. Start taking on: September 11, 2019 What changed:   medication strength  how much to take   Garlic 1500 1500 MG Caps Generic drug: Garlic Take 1 capsule by mouth daily.   hydrochlorothiazide 12.5 MG capsule Commonly known as: MICROZIDE Take 12.5 mg by mouth daily.   metoprolol tartrate 25 MG tablet Commonly known as: LOPRESSOR Take 25 mg by mouth 2 (two) times daily.   pantoprazole 40 MG tablet Commonly known as: PROTONIX Take 40 mg by mouth 2 (two) times daily before a meal.   SYNTHROID PO Take 1 tablet by mouth daily.   traMADol 50 MG tablet Commonly known as: ULTRAM Take 50 mg by mouth 2 (two) times daily as needed for moderate pain.      Allergies  Allergen Reactions  . Erythromycin     itching  . Escitalopram Other (See Comments)    insomnia  . Bupropion Itching and Other (See Comments)    insomnia Unknown Unknown insomnia     Follow-up Information    [facility] Follow up.   Specialty: Home Health Services Why: The home health agency will contact you for the first home visit. Contact information: [address] [place] [address] [phone]        [name] [address] Office Follow up.   Specialty: Cardiology Why: 09/23/2019 @ 8:30AM, wound check visit (heart monitor) Contact information: [address] [place] [address] [phone]               The results of significant diagnostics from this hospitalization (including imaging, microbiology, ancillary and laboratory) are listed below for  reference.    Significant Diagnostic Studies: CT Head Wo Contrast  Result Date: 09/09/2019 CLINICAL DATA:  Headache, blurred vision EXAM: CT HEAD WITHOUT CONTRAST TECHNIQUE: Contiguous axial images were obtained from the base of the skull through the vertex without intravenous contrast. COMPARISON:  09/04/2018 FINDINGS: Brain: Area of low-density noted in the posteromedial parietal lobe/occipital lobe on the right concerning for infarct, age indeterminate. There is atrophy and chronic small vessel disease changes. No hydrocephalus or hemorrhage. Vascular: No hyperdense vessel or unexpected calcification. Skull: No acute calvarial abnormality. Sinuses/Orbits: Visualized paranasal sinuses and mastoids clear. Orbital soft tissues unremarkable. Other: None IMPRESSION: Area low-density in the posteromedial right parietal lobe/occipital lobe concerning for age-indeterminate infarct. Atrophy, chronic small vessel disease. Electronically Signed   By: Charlett Nose M.D.   On: 09/09/2019 09:57   MR ANGIO HEAD WO CONTRAST  Result Date: 09/10/2019 CLINICAL DATA:  Stroke follow-up EXAM: MRA HEAD WITHOUT CONTRAST TECHNIQUE: Angiographic images of the Circle of Willis were obtained using MRA technique without intravenous contrast. COMPARISON:  Brain MRI from yesterday FINDINGS: Right dominant vertebral artery. Dominant left PICA and right AICA. Hypoplastic right A1 segment. No branch occlusion,  generalized beading, or aneurysm. Atheromatous irregularity/narrowing seen to the bilateral ICA, M1 segments, right proximal A2 segment, and especially to the left distal A2 segment where there is high-grade narrowing. Mild atheromatous changes likely present at the bilateral P4 level. Patchy T1 hyperintensity in the right occipital cortex at sites of acute and subacute infarction (at the occipital pole there is infarct changes by MRI that are not diffusion hyperintense). No discrete hematoma IMPRESSION: 1. No emergent vascular  finding. Symmetric, robust flow in the right PCA. 2. Intracranial atherosclerosis as described above. No flow reducing and correctable narrowing. Electronically Signed   By: Marnee SpringJonathon  Watts M.D.   On: 09/10/2019 04:51   MR ANGIO NECK WO CONTRAST  Result Date: 09/10/2019 CLINICAL DATA:  Occipital infarct on the right. EXAM: MRA NECK WITHOUTCONTRAST TECHNIQUE: Angiographic images of the neck were obtained using MRA technique without intravenous contrast. COMPARISON:  None. FINDINGS: Normal appearance of the covered arch.  Three vessel branching. Turbulent flow at the right more than left ICA bifurcation attributed to atherosclerosis. Quantification at right ICA stenosis is limited due to degree of artifact and signal dropout. No evidence of proximal subclavian stenosis. The right vertebral artery is dominant. Both vertebral arteries show robust flow to the dura. Early branching left PICA. IMPRESSION: 1. No flow reducing stenosis or embolic source seen in the posterior circulation. 2. Atheromatous stenosis and turbulent flow at the right cervical carotid bifurcation with quantification limited by the degree of artifact. Electronically Signed   By: Marnee SpringJonathon  Watts M.D.   On: 09/10/2019 04:55   MR Brain W and Wo Contrast  Result Date: 09/09/2019 CLINICAL DATA:  Neuro deficit, acute, stroke suspected; brain mass or lesion. Additional provided: Patient reports headache and blurred vision for 1 week, unable to see primary medical doctor, patient states vision grossly intact but fuzzy. EXAM: MRI HEAD WITHOUT AND WITH CONTRAST TECHNIQUE: Multiplanar, multiecho pulse sequences of the brain and surrounding structures were obtained without and with intravenous contrast. CONTRAST:  7mL GADAVIST GADOBUTROL 1 MMOL/ML IV SOLN COMPARISON:  Noncontrast head CT 09/09/2019, CT angiogram head/neck 09/04/2018. FINDINGS: Brain: Moderate generalized parenchymal atrophy 2.8 cm region of cortically based restricted diffusion within the  paramedian right parietooccipital lobes and callosal splenium on the right, within the right PCA vascular territory. There is corresponding T2/FLAIR hyperintensity and enhancement at these sites. These findings are most consistent with early subacute right PCA territory ischemic infarcts. There is a small focus of SWI signal loss within the infarction territory which may reflect mild petechial hemorrhage. Additional acute/early subacute infarcts within the right thalamus (series 2, image 24), posterior right temporal lobe (series 2, image 18) and left occipital lobe (series 2, image 16). Background mild patchy T2/FLAIR hyperintensity within the cerebral white matter is nonspecific, but consistent with chronic small vessel ischemic disease. Additional scattered supratentorial chronic microhemorrhages likely reflecting sequela of hypertensive microangiopathy. No evidence of intracranial mass. No extra-axial fluid collection. No midline shift. Vascular: No definite loss of expected proximal arterial flow voids. Skull and upper cervical spine: No focal marrow lesion. Sinuses/Orbits: Visualized orbits show no acute finding. Prior left lens replacement. Mild paranasal sinus mucosal thickening, most notably ethmoid. Trace fluid within right mastoid air cells. IMPRESSION: 2.8 cm early subacute ischemic infarct within the right PCA territory affecting the paramedian right parietooccipital lobes and callosal splenium. Suspected mild petechial hemorrhage at this site. Additional subcentimeter acute/early subacute infarcts within the right thalamus, posterior right temporal lobe and left occipital lobe. Background moderate generalized parenchymal atrophy and mild  chronic small vessel ischemic disease. Mild paranasal sinus mucosal thickening. Trace fluid within right mastoid air cells. Electronically Signed   By: Jackey Loge DO   On: 09/09/2019 15:56   EP PPM/ICD IMPLANT  Result Date: 09/10/2019 SURGEON:  Loman Brooklyn, MD    PREPROCEDURE DIAGNOSIS:  Cryptogenic Stroke   POSTPROCEDURE DIAGNOSIS:  Cryptogenic Stroke    PROCEDURES:  1. Implantable loop recorder implantation   INTRODUCTION:  Rolinda Impson is a 79 y.o. female with a history of unexplained stroke who presents today for implantable loop implantation.  The patient has had a cryptogenic stroke.  Despite an extensive workup by neurology, no reversible causes have been identified.  she has worn telemetry during which she did not have arrhythmias.  There is significant concern for possible atrial fibrillation as the cause for the patients stroke.  The patient therefore presents today for implantable loop implantation.   DESCRIPTION OF PROCEDURE:  Informed written consent was obtained, and the patient was brought to the electrophysiology lab in a fasting state.  The patient required no sedation for the procedure today.  Mapping over the patient's chest was performed by the EP lab staff to identify the area where electrograms were most prominent for ILR recording.  This area was found to be the left parasternal region over the 3rd-4th intercostal space. The patients left chest was therefore prepped and draped in the usual sterile fashion by the EP lab staff. The skin overlying the left parasternal region was infiltrated with lidocaine for local analgesia.  A 0.5-cm incision was made over the left parasternal region over the 3rd intercostal space.  A subcutaneous ILR pocket was fashioned using a combination of sharp and blunt dissection.  A Medtronic Reveal Linq model [place] [id number] implantable loop recorder was then placed into the pocket  R waves were very prominent and measured 0.2mV. EBL<1 ml.  Steri- Strips and a sterile dressing were then applied.  There were no early apparent complications.   CONCLUSIONS:  1. Successful implantation of a Medtronic Reveal LINQ implantable loop recorder for cryptogenic stroke  2. No early apparent complications.   ECHOCARDIOGRAM  COMPLETE  Result Date: 09/10/2019    ECHOCARDIOGRAM REPORT   Patient Name:   [facility] Date of Exam: 09/10/2019 Medical Rec #:  [id number]   Height:       62.0 in Accession #:    [id number]  Weight:       154.0 lb Date of Birth:  Mar 23, 1940    BSA:          1.711 m Patient Age:    79 years    BP:           148/60 mmHg Patient Gender: F           HR:           84 bpm. Exam Location:  Inpatient Procedure: 2D Echo Indications:    Stroke 434.91 / I163.9  History:        Patient has no prior history of Echocardiogram examinations.                 Risk Factors:Hypertension. Thyroid disease. GERD.  Sonographer:    Leta Jungling RDCS Referring Phys: [id number] CAROLE N HALL IMPRESSIONS  1. Left ventricular ejection fraction, by estimation, is 60 to 65%. The left ventricle has normal function. The left ventricle has no regional wall motion abnormalities. Left ventricular diastolic parameters are consistent with Grade I diastolic dysfunction (impaired relaxation). Elevated left atrial  pressure.  2. Right ventricular systolic function is normal. The right ventricular size is normal.  3. Left atrial size was mildly dilated.  4. The mitral valve is normal in structure. Trivial mitral valve regurgitation. No evidence of mitral stenosis.  5. The aortic valve is tricuspid. Aortic valve regurgitation is mild. Mild aortic valve sclerosis is present, with no evidence of aortic valve stenosis.  6. The inferior vena cava is normal in size with greater than 50% respiratory variability, suggesting right atrial pressure of 3 mmHg. FINDINGS  Left Ventricle: Left ventricular ejection fraction, by estimation, is 60 to 65%. The left ventricle has normal function. The left ventricle has no regional wall motion abnormalities. The left ventricular internal cavity size was normal in size. There is  no left ventricular hypertrophy. Left ventricular diastolic parameters are consistent with Grade I diastolic dysfunction (impaired relaxation). Elevated  left atrial pressure. Right Ventricle: The right ventricular size is normal. Right ventricular systolic function is normal. Left Atrium: Left atrial size was mildly dilated. Right Atrium: Right atrial size was normal in size. Pericardium: There is no evidence of pericardial effusion. Mitral Valve: The mitral valve is normal in structure. Normal mobility of the mitral valve leaflets. Mild mitral annular calcification. Trivial mitral valve regurgitation. No evidence of mitral valve stenosis. Tricuspid Valve: The tricuspid valve is normal in structure. Tricuspid valve regurgitation is mild . No evidence of tricuspid stenosis. Aortic Valve: The aortic valve is tricuspid. Aortic valve regurgitation is mild. Aortic regurgitation PHT measures 451 msec. Mild aortic valve sclerosis is present, with no evidence of aortic valve stenosis. Pulmonic Valve: The pulmonic valve was not well visualized. Pulmonic valve regurgitation is not visualized. No evidence of pulmonic stenosis. Aorta: The aortic root is normal in size and structure. Venous: The inferior vena cava is normal in size with greater than 50% respiratory variability, suggesting right atrial pressure of 3 mmHg. IAS/Shunts: No atrial level shunt detected by color flow Doppler.  LEFT VENTRICLE PLAX 2D LVIDd:         4.10 cm  Diastology LVIDs:         3.00 cm  LV e' lateral:   5.77 cm/s LV PW:         0.90 cm  LV E/e' lateral: 18.2 LV IVS:        0.90 cm  LV e' medial:    5.22 cm/s LVOT diam:     1.60 cm  LV E/e' medial:  20.1 LV SV:         53 LV SV Index:   31 LVOT Area:     2.01 cm  RIGHT VENTRICLE TAPSE (M-mode): 1.6 cm LEFT ATRIUM             Index       RIGHT ATRIUM           Index LA diam:        4.00 cm 2.34 cm/m  RA Area:     10.30 cm LA Vol (A2C):   54.9 ml 32.09 ml/m RA Volume:   19.80 ml  11.57 ml/m LA Vol (A4C):   59.5 ml 34.78 ml/m LA Biplane Vol: 59.3 ml 34.66 ml/m  AORTIC VALVE LVOT Vmax:   145.00 cm/s LVOT Vmean:  90.000 cm/s LVOT VTI:    0.266 m  AI PHT:      451 msec  AORTA Ao Root diam: 2.50 cm MITRAL VALVE MV Area (PHT): 3.21 cm     SHUNTS MV Decel Time: 236 msec  Systemic VTI:  0.27 m MV E velocity: 105.00 cm/s  Systemic Diam: 1.60 cm MV A velocity: 109.00 cm/s MV E/A ratio:  0.96 Olga Millers MD Electronically signed by Olga Millers MD Signature Date/Time: 09/10/2019/10:31:44 AM    Final     Microbiology: Recent Results (from the past 240 hour(s))  SARS Coronavirus 2 by RT PCR (hospital order, performed in [name] hospital lab) Nasopharyngeal Nasopharyngeal Swab     Status: None   Collection Time: 09/09/19  4:37 PM   Specimen: Nasopharyngeal Swab  Result Value Ref Range Status   SARS Coronavirus 2 NEGATIVE NEGATIVE Final    Comment: (NOTE) SARS-CoV-2 target nucleic acids are NOT DETECTED.  The SARS-CoV-2 RNA is generally detectable in upper and lower respiratory specimens during the acute phase of infection. The lowest concentration of SARS-CoV-2 viral copies this assay can detect is 250 copies / mL. A negative result does not preclude SARS-CoV-2 infection and should not be used as the sole basis for treatment or other patient management decisions.  A negative result may occur with improper specimen collection / handling, submission of specimen other than nasopharyngeal swab, presence of viral mutation(s) within the areas targeted by this assay, and inadequate number of viral copies (<250 copies / mL). A negative result must be combined with clinical observations, patient history, and epidemiological information.  Fact Sheet for Patients:   BoilerBrush.com.cy  Fact Sheet for Healthcare Providers: https://pope.com/  This test is not yet approved or  cleared by the [place] FDA and has been authorized for detection and/or diagnosis of SARS-CoV-2 by FDA under an Emergency Use Authorization (EUA).  This EUA will remain in effect (meaning this test can be used)  for the duration of the COVID-19 declaration under Section 564(b)(1) of the Act, 21 U.S.C. section 360bbb-3(b)(1), unless the authorization is terminated or revoked sooner.  Performed at [name], [address] [place], [place] [address]      Labs: Basic Metabolic Panel: Recent Labs  Lab 09/09/19 0953 09/10/19 0411  NA 138 140  K 4.7 3.4*  CL 101 102  CO2 29 26  GLUCOSE 97 91  BUN 23 19  CREATININE 1.45* 1.27*  CALCIUM 10.4* 9.2   Liver Function Tests: Recent Labs  Lab 09/09/19 0953  AST 35  ALT 11  ALKPHOS 37*  BILITOT 0.5  PROT 7.1  ALBUMIN 4.3   No results for input(s): LIPASE, AMYLASE in the last 168 hours. No results for input(s): AMMONIA in the last 168 hours. CBC: Recent Labs  Lab 09/09/19 0953 09/10/19 0411  WBC 9.9 7.5  NEUTROABS 5.4  --   HGB 12.5 11.0*  HCT 37.7 33.8*  MCV 87.1 86.9  PLT 395 370    CBG: Recent Labs  Lab 09/09/19 0918 09/09/19 2120 09/09/19 2336 09/10/19 0615  GLUCAP 97 110* 99 106*       Signed:  Meredeth Ide MD.  [facility] 09/10/2019, 5:59 PM

## 2019-09-10 NOTE — Discharge Instructions (Signed)
Monitor implant wound care instructions Keep incision clean and dry for 3 days. You can remove outer dressing tomorrow. Leave steri-strips (little pieces of tape) on until seen in the office for wound check appointment. Call the office [phone]) for redness, drainage, swelling, or fever.

## 2019-09-10 NOTE — Progress Notes (Signed)
Patient discharge home with son.  Dishcharge instruction discussed.  IV and telemetry have been removed.  Patient denies any discomfort or any concern upon discharge.

## 2019-09-10 NOTE — Progress Notes (Signed)
STROKE TEAM PROGRESS NOTE   INTERVAL HISTORY Her husband is at the bedside.  I personally reviewed history of presenting illness with the patient, electronic medical records and imaging films in PACS.  She presented with several days of difficulty reading.  She has no prior history of atrial fibrillation, palpitations or syncope.  MRI scan shows embolic right occipital infarct.  MRA of the brain and neck showed only mild proximal right ICA stenosis.  Echo and carotid Dopplers are pending. Vitals:   09/09/19 2000 09/09/19 2314 09/10/19 0355 09/10/19 0733  BP: (!) 152/58 (!) 143/59 (!) 153/61 (!) 148/60  Pulse: 82 81 81 84  Resp: 18 18 18 18   Temp: 97.7 F (36.5 C) (!) 97.5 F (36.4 C) 97.6 F (36.4 C) 98 F (36.7 C)  TempSrc: Oral Oral Oral Oral  SpO2: 97% 97% 96% 97%   CBC:  Recent Labs  Lab 09/09/19 0953 09/10/19 0411  WBC 9.9 7.5  NEUTROABS 5.4  --   HGB 12.5 11.0*  HCT 37.7 33.8*  MCV 87.1 86.9  PLT 395 370   Basic Metabolic Panel:  Recent Labs  Lab 09/09/19 0953 09/10/19 0411  NA 138 140  K 4.7 3.4*  CL 101 102  CO2 29 26  GLUCOSE 97 91  BUN 23 19  CREATININE 1.45* 1.27*  CALCIUM 10.4* 9.2   Lipid Panel:  Recent Labs  Lab 09/09/19 1843  CHOL 159  TRIG 233*  HDL 28*  CHOLHDL 5.7  VLDL 47*  LDLCALC 84   HgbA1c:  Recent Labs  Lab 09/09/19 1843  HGBA1C 5.9*   Urine Drug Screen: No results for input(s): LABOPIA, COCAINSCRNUR, LABBENZ, AMPHETMU, THCU, LABBARB in the last 168 hours.  Alcohol Level No results for input(s): ETH in the last 168 hours.  IMAGING past 24 hours CT Head Wo Contrast  Result Date: 09/09/2019 CLINICAL DATA:  Headache, blurred vision EXAM: CT HEAD WITHOUT CONTRAST TECHNIQUE: Contiguous axial images were obtained from the base of the skull through the vertex without intravenous contrast. COMPARISON:  09/04/2018 FINDINGS: Brain: Area of low-density noted in the posteromedial parietal lobe/occipital lobe on the right concerning for  infarct, age indeterminate. There is atrophy and chronic small vessel disease changes. No hydrocephalus or hemorrhage. Vascular: No hyperdense vessel or unexpected calcification. Skull: No acute calvarial abnormality. Sinuses/Orbits: Visualized paranasal sinuses and mastoids clear. Orbital soft tissues unremarkable. Other: None IMPRESSION: Area low-density in the posteromedial right parietal lobe/occipital lobe concerning for age-indeterminate infarct. Atrophy, chronic small vessel disease. Electronically Signed   By: 09/06/2018 M.D.   On: 09/09/2019 09:57   MR ANGIO HEAD WO CONTRAST  Result Date: 09/10/2019 CLINICAL DATA:  Stroke follow-up EXAM: MRA HEAD WITHOUT CONTRAST TECHNIQUE: Angiographic images of the Circle of Willis were obtained using MRA technique without intravenous contrast. COMPARISON:  Brain MRI from yesterday FINDINGS: Right dominant vertebral artery. Dominant left PICA and right AICA. Hypoplastic right A1 segment. No branch occlusion, generalized beading, or aneurysm. Atheromatous irregularity/narrowing seen to the bilateral ICA, M1 segments, right proximal A2 segment, and especially to the left distal A2 segment where there is high-grade narrowing. Mild atheromatous changes likely present at the bilateral P4 level. Patchy T1 hyperintensity in the right occipital cortex at sites of acute and subacute infarction (at the occipital pole there is infarct changes by MRI that are not diffusion hyperintense). No discrete hematoma IMPRESSION: 1. No emergent vascular finding. Symmetric, robust flow in the right PCA. 2. Intracranial atherosclerosis as described above. No flow reducing and  correctable narrowing. Electronically Signed   By: Marnee Spring M.D.   On: 09/10/2019 04:51   MR ANGIO NECK WO CONTRAST  Result Date: 09/10/2019 CLINICAL DATA:  Occipital infarct on the right. EXAM: MRA NECK WITHOUTCONTRAST TECHNIQUE: Angiographic images of the neck were obtained using MRA technique without  intravenous contrast. COMPARISON:  None. FINDINGS: Normal appearance of the covered arch.  Three vessel branching. Turbulent flow at the right more than left ICA bifurcation attributed to atherosclerosis. Quantification at right ICA stenosis is limited due to degree of artifact and signal dropout. No evidence of proximal subclavian stenosis. The right vertebral artery is dominant. Both vertebral arteries show robust flow to the dura. Early branching left PICA. IMPRESSION: 1. No flow reducing stenosis or embolic source seen in the posterior circulation. 2. Atheromatous stenosis and turbulent flow at the right cervical carotid bifurcation with quantification limited by the degree of artifact. Electronically Signed   By: Marnee Spring M.D.   On: 09/10/2019 04:55   MR Brain W and Wo Contrast  Result Date: 09/09/2019 CLINICAL DATA:  Neuro deficit, acute, stroke suspected; brain mass or lesion. Additional provided: Patient reports headache and blurred vision for 1 week, unable to see primary medical doctor, patient states vision grossly intact but fuzzy. EXAM: MRI HEAD WITHOUT AND WITH CONTRAST TECHNIQUE: Multiplanar, multiecho pulse sequences of the brain and surrounding structures were obtained without and with intravenous contrast. CONTRAST:  61mL GADAVIST GADOBUTROL 1 MMOL/ML IV SOLN COMPARISON:  Noncontrast head CT 09/09/2019, CT angiogram head/neck 09/04/2018. FINDINGS: Brain: Moderate generalized parenchymal atrophy 2.8 cm region of cortically based restricted diffusion within the paramedian right parietooccipital lobes and callosal splenium on the right, within the right PCA vascular territory. There is corresponding T2/FLAIR hyperintensity and enhancement at these sites. These findings are most consistent with early subacute right PCA territory ischemic infarcts. There is a small focus of SWI signal loss within the infarction territory which may reflect mild petechial hemorrhage. Additional acute/early  subacute infarcts within the right thalamus (series 2, image 24), posterior right temporal lobe (series 2, image 18) and left occipital lobe (series 2, image 16). Background mild patchy T2/FLAIR hyperintensity within the cerebral white matter is nonspecific, but consistent with chronic small vessel ischemic disease. Additional scattered supratentorial chronic microhemorrhages likely reflecting sequela of hypertensive microangiopathy. No evidence of intracranial mass. No extra-axial fluid collection. No midline shift. Vascular: No definite loss of expected proximal arterial flow voids. Skull and upper cervical spine: No focal marrow lesion. Sinuses/Orbits: Visualized orbits show no acute finding. Prior left lens replacement. Mild paranasal sinus mucosal thickening, most notably ethmoid. Trace fluid within right mastoid air cells. IMPRESSION: 2.8 cm early subacute ischemic infarct within the right PCA territory affecting the paramedian right parietooccipital lobes and callosal splenium. Suspected mild petechial hemorrhage at this site. Additional subcentimeter acute/early subacute infarcts within the right thalamus, posterior right temporal lobe and left occipital lobe. Background moderate generalized parenchymal atrophy and mild chronic small vessel ischemic disease. Mild paranasal sinus mucosal thickening. Trace fluid within right mastoid air cells. Electronically Signed   By: Jackey Loge DO   On: 09/09/2019 15:56    PHYSICAL EXAM Pleasant elderly Caucasian lady not in distress. . Afebrile. Head is nontraumatic. Neck is supple without bruit.    Cardiac exam no murmur or gallop. Lungs are clear to auscultation. Distal pulses are well felt. Neurological Exam  : She is awake alert oriented to time place and person.  No aphasia apraxia or dysarthria.  Extraocular movements are  full range without nystagmus.  Left homonymous hemianopsia.  Fundi not visualized.  Face is symmetric without weakness.  Tongue midline.   Motor system exam no upper or lower extremity drift.  Symmetric equal strength in all 4 extremities.  Sensation intact bilaterally.  Coordination is accurate.  Gait not tested.  ASSESSMENT/PLAN [name] is a 79 y.o. female with history of HTN, thyroid disease and GERD presenting with 1 week history of blurred vision .   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 Small vessel disease. Atrophy.   MRI w/w/o R PCA infarct w/ petechial hemorrhage. Additional infarcts R thalamus, posterior R temporal lobe and L occipital lobe. Small vessel disease. Atrophy. Sinus dz. Mastoid fluid.     MRA  No LVO. Good flow R PCA. Intracranial atherosclerosis   MRA neck no significant stenosis. 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   2D Echo EF 60-65%. No source of embolus   A [facility] electrophysiologist will consult and consider placement of an implantable loop recorder to evaluate for atrial fibrillation as etiology of stroke. This has been explained to patient/family by Dr. Pearlean Brownie and they are agreeable. I reached out to EP cardiology who will be able to place today  LDL 84  HgbA1c 5.9  VTE prophylaxis - SCDs   aspirin 81 mg daily prior to admission, now on aspirin 325 mg daily. Decrease aspirin to 81 and add plavi.x. continue DAPT x 3 weeks then plavix alone   Therapy recommendations:  HH OT  Disposition:  pending   Hypertension  Stable . Permissive hypertension (OK if < 220/120) but gradually normalize in 5-7 days . 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 fib.  Recommend aspirin 81 mg and Plavix 75 mg daily for 3 weeks followed by Plavix alone.  Continue ongoing stroke work-up and aggressive risk factor modification.  If 2D echo is unremarkable will need loop recorder placement for paroxysmal A. fib.  Long discussion with the patient and husband and answered questions.  Discussed with Dr. Sharl Ma.  Greater than 50% time during this 35-minute visit was spent in counseling and coordination of care about her embolic strokes and discussion about evaluation and treatment and prevention plan and answering questions Delia Heady, MD  To contact Stroke Continuity provider, please refer to [email or web address]. After hours, contact General Neurology

## 2019-09-10 NOTE — Evaluation (Signed)
Occupational Therapy Evaluation Patient Details Name: Karina [name] MRN: [id number] DOB: 04/05/1940 Today's Date: 09/10/2019    History of Present Illness 79 y.o. 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 MRI brain showed 2.8 cm early subacute ischemic infarct within the R PCA territory affecting the paramedian R parietooccipital lobes and callosal splenium. Suspected mild petechial hemorrhage at this site. Additional subcentimeter acute/early subacute infarcts within the R thalamus, posterior R temporal lobe and L occipital lobe   Clinical Impression   PTA pt reports being independent with devices for mobility. Pt was admitted for above and treated for problem list below (see OT Problem List). Pt A&Ox4 with good awareness of medical work up and safety awareness. Requires supervision - Min A with for ADLs due to weakness, blurred vision, decrease balance and decreased activity tolerance. Requires min guard - min A with transfers and ambulation due to decreased balance, management of lines, and Min A to boost from sitting. Pt's husband present for eval and confirming 24/7 assist/support. Pt reports feeling weaker than normal and needing more assistance than usual. Pt reported poor sleep last night and continued blurred vision. Pt educated on environmental modifications to use at home to improve safety with decreased vision. Believe pt would benefit from skilled OT services acutely and at the [facility] level to increase safety and independence with ADLs.  *This evaluation was conducted under a supervising OTR/L who assisted in evaluation and documentation.     Follow Up Recommendations  Home  health OT;Supervision/Assistance - 24 hour          Precautions / Restrictions Precautions Precautions: Fall Restrictions Weight Bearing Restrictions: No      Mobility Bed Mobility Overal bed mobility: Needs Assistance Bed Mobility: Sit to Supine       Sit to supine: Min guard   General bed mobility comments: Min guard for safety - pt needing cues for sequencing with position in bed  Transfers Overall transfer level: Needs assistance Equipment used: 4-wheeled walker Transfers: Sit to/from Stand Sit to Stand: Min assist         General transfer comment: completed x2 with min A for boost    Balance Overall balance assessment: Needs assistance Sitting-balance support: Feet supported;Bilateral upper extremity supported Sitting balance-Leahy Scale: Good Sitting balance - Comments: min guard   Standing balance support: No upper extremity supported;During functional activity Standing balance-Leahy Scale: Fair Standing balance comment: supervision with static standing                           ADL either performed or assessed with clinical judgement   ADL Overall ADL's : Needs assistance/impaired Eating/Feeding: Independent;Sitting Eating/Feeding Details (indicate cue type and reason): pt eating breakfast EOB upon arrival Grooming: Wash/dry hands;Wash/dry face;Oral care;Standing (close supervision) Grooming Details (indicate cue type and reason): close supervision for safety Upper Body Bathing: Supervision/ safety;Sitting   Lower Body Bathing: Minimal assistance;Sit to/from stand Lower Body Bathing Details (indicate cue type and reason): Min A with sit to stand - pt reports completing sponge baths due to fear of falling in tub     Lower Body Dressing: Minimal assistance;Sit to/from stand;Sitting/lateral leans Lower Body Dressing Details (indicate cue type and reason): Pt able  to don socks with min guard but needing Min A for boost sit-stand Toilet  Transfer: Minimal Holiday representative;Ambulation (rollator) Toilet Transfer Details (indicate cue type and reason): min A for safety with toilet sit-stand Toileting- Clothing Manipulation and Hygiene: Minimal assistance;Sit to/from stand;Sitting/lateral lean Toileting - Clothing Manipulation Details (indicate cue type and reason): Min A with sit to stand   Tub/Shower Transfer Details (indicate cue type and reason): Pt does not use tub at home - gives herself sponge baths Functional mobility during ADLs: Min guard (rollator) General ADL Comments: Pt with weakness, blurred vision, decrease balance and decreased activity tolerance     Vision Baseline Vision/History: Wears glasses Wears Glasses: At all times Patient Visual Report: Blurring of vision Vision Assessment?: Yes Eye Alignment: Within Functional Limits Ocular Range of Motion: Within Functional Limits Alignment/Gaze Preference: Within Defined Limits Tracking/Visual Pursuits: Able to track stimulus in all quads without difficulty Convergence: Within functional limits Visual Fields: No apparent deficits Additional Comments: No noted deficits in formal assessment or functionally. Pt just stated that things are blurry but can still read menu and containers without difficulties. Pt declined dizziness or lightheadedness during assessment            Pertinent Vitals/Pain Pain Assessment: No/denies pain     Hand Dominance Right   Extremity/Trunk Assessment Upper Extremity Assessment Upper Extremity Assessment: Overall WFL for tasks assessed   Lower Extremity Assessment Lower Extremity Assessment: Defer to PT evaluation   Cervical / Trunk Assessment Cervical / Trunk Assessment: Normal   Communication Communication Communication: HOH   Cognition Arousal/Alertness: Awake/alert Behavior During Therapy: WFL for tasks assessed/performed Overall Cognitive Status: Within Functional Limits for tasks assessed                                  General Comments: Pt with good safety awareness and taking time with mobility.    General Comments  Pt's husband present for eval to confirm support. Pt reports feeling weaker than normal and needing more assistance than usual. Pt reported poor sleep last night and continued blurred vision. Pt educated on environmental modifications to use at home to improve safety with decreased vision            Home Living Family/patient expects to be discharged to:: Private residence Living Arrangements: Spouse/significant other Available Help at Discharge: Friend(s);Available 24 hours/day Type of Home: House Home Access: Stairs to enter [employer] of Steps: 1 Entrance Stairs-Rails: Left Home Layout: One level     Bathroom Shower/Tub: Chief Strategy Officer: Handicapped height Bathroom Accessibility: Yes How Accessible: Accessible via walker Home Equipment: Walker - 2 wheels;Walker - 4 wheels;Grab bars - toilet   Additional Comments: Pt husband present to confirm assist at home      Prior Functioning/Environment Level of Independence: Independent with assistive device(s)        Comments: Pt reports being independent with ADLs but using RW for mobility in the home and rollator in community. Pt does not attempt to get into tub and gives herself sponge baths instead for safety.        OT Problem List: Decreased strength;Decreased activity tolerance;Impaired balance (sitting and/or standing);Impaired vision/perception      OT Treatment/Interventions: Self-care/ADL training;Neuromuscular education;Energy conservation;Therapeutic activities;Visual/perceptual remediation/compensation;Patient/family education;Balance training    OT Goals(Current goals can be found in the care plan section) Acute Rehab OT Goals Patient Stated Goal: feel better OT Goal Formulation: With patient/family Time  For Goal Achievement: 09/24/19 Potential to  Achieve Goals: Good  OT Frequency: Min 2X/week    AM-PAC OT "6 Clicks" Daily Activity     Outcome Measure Help from another person eating meals?: None Help from another person taking care of personal grooming?: A Little Help from another person toileting, which includes using toliet, bedpan, or urinal?: A Little Help from another person bathing (including washing, rinsing, drying)?: A Little Help from another person to put on and taking off regular upper body clothing?: A Little Help from another person to put on and taking off regular lower body clothing?: A Little 6 Click Score: 19   End of Session Equipment Utilized During Treatment: Gait belt;Other (comment) (pt's rollator) Nurse Communication: Mobility status  Activity Tolerance: Patient tolerated treatment well Patient left: in bed;with call bell/phone within reach;with bed alarm set  OT Visit Diagnosis: Unsteadiness on feet (R26.81);Muscle weakness (generalized) (M62.81);Other symptoms and signs involving the nervous system (R29.898);Low vision, both eyes (H54.2)                Time: 7353-2992 OT Time Calculation (min): 25 min Charges:  OT General Charges $OT Visit: 1 Visit OT Evaluation $OT Eval Moderate Complexity: 1 Mod OT Treatments $Self Care/Home Management : 8-22 mins  Zarina Pe/OTS Lavinia Mcneely 09/10/2019, 10:01 AM

## 2019-09-10 NOTE — TOC Initial Note (Signed)
Transition of Care [facility]) - Initial/Assessment Note    Patient Details  Name: Karina [name] MRN: [id number] Date of Birth: Sep 21, 1940  Transition of Care [facility]) CM/SW Contact:    Kermit Balo, RN Phone Number: 09/10/2019, 2:48 PM  Clinical Narrative:                 Recommendations are for [facility] servies. CM provided choice and [facility] decided on. Cory with [facility] accepted the referral.  Pt denies issues with home medications or transportation. TOC following for further d/c needs.   Expected Discharge Plan: Home w Home Health Services Barriers to Discharge: Continued Medical Work up   Patient Goals and CMS Choice   CMS Medicare.gov Compare Post Acute Care list provided to:: Patient Choice offered to / list presented to : Patient  Expected Discharge Plan and Services Expected Discharge Plan: Home w Home Health Services   Discharge Planning Services: CM Consult Post Acute Care Choice: Home Health Living arrangements for the past 2 months: Single Family Home                           HH Arranged: PT, OT HH Agency: [facility] Date [facility] Agency Contacted: 09/10/19   Representative spoke with at [facility] Agency: Kandee Keen  Prior Living Arrangements/Services Living arrangements for the past 2 months: Single Family Home Lives with:: Spouse Patient language and need for interpreter reviewed:: Yes Do you feel safe going back to the place where you live?: Yes      Need for Family Participation in Patient Care: Yes (Comment) Care giver support system in place?: Yes (comment) Current home services: DME (rollator/ walker/ shower seat) Criminal Activity/Legal Involvement Pertinent to Current Situation/Hospitalization: No - Comment as needed  Activities of Daily Living      Permission Sought/Granted                  Emotional Assessment Appearance:: Appears stated age Attitude/Demeanor/Rapport: Engaged Affect (typically observed): Accepting Orientation: : Oriented to Self,  Oriented to Place, Oriented to  Time, Oriented to Situation   Psych Involvement: No (comment)  Admission diagnosis:  CVA (cerebral vascular accident) (HCC) [I63.9] Cerebrovascular accident (CVA), unspecified mechanism (HCC) [I63.9] Patient Active Problem List   Diagnosis Date Noted  . CVA (cerebral vascular accident) (HCC) 09/09/2019   PCP:  Brooke Bonito, MD Pharmacy:   [id number] - [place], [place] - [address] [place] [address] Phone: [phone] Fax: [phone]     Social Determinants of Health (SDOH) Interventions    Readmission Risk Interventions No flowsheet data found.

## 2019-09-10 NOTE — Evaluation (Signed)
Clinical/Bedside Swallow Evaluation Patient Details  Name: Karina [name] MRN: [id number] Date of Birth: [birth year]  Today's Date: 09/10/2019 Time: SLP Start Time (ACUTE ONLY): 1058 SLP Stop Time (ACUTE ONLY): 1112 SLP Time Calculation (min) (ACUTE ONLY): 14 min  Past Medical History:  Past Medical History:  Diagnosis Date  . GERD (gastroesophageal reflux disease)   . Hypertension   . Thyroid disease    Past Surgical History:  Past Surgical History:  Procedure Laterality Date  . ABDOMINAL HYSTERECTOMY    . APPENDECTOMY    . CHOLECYSTECTOMY    . EYE SURGERY     HPI:  Karina [name] is a 79 y.o. female with medical history significant for essential hypertension, hyperlipidemia, former tobacco user quit in 2014, hypothyroidism, bilateral carotid artery stenosis status post endarterectomy, ambulatory dysfunction uses a walker, ovarian cancer status post hysterectomy in 2014 who presented to [facility] ED as a transfer from [facility] ED due to concern for possible stroke. 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 No chest imaging.   Assessment / Plan / Recommendation Clinical Impression  Pt presents with functional swallowing as assessed clincially.  Pt exhibited no clinical s/s of aspiration with any consistencies trialed and there was good oral clearance of puree and solid textures.  Although involvement of L thalamus is concerning for silent aspiration, pt's symptoms began around 5 days ago, but she delayed seeking care. She has continued to tolerate PO diet with no concerns for aspiration or indication of pneumonia.   Recommend continuing regular texture diet with thin liquids. SLP Visit Diagnosis: Dysphagia, unspecified (R13.10)    Aspiration Risk  No limitations    Diet Recommendation Regular;Thin liquid   Liquid Administration via: Cup;Straw Medication Administration: Whole meds with liquid Supervision: Patient able to self feed Compensations: Slow rate;Small  sips/bites Postural Changes: Seated upright at 90 degrees    Other  Recommendations Oral Care Recommendations: Oral care BID   Follow up Recommendations None      Frequency and Duration  (N/A)          Prognosis Prognosis for Safe Diet Advancement:  (N/A)      Swallow Study   General HPI: Karina [name] is a 79 y.o. female with medical history significant for essential hypertension, hyperlipidemia, former tobacco user quit in 2014, hypothyroidism, bilateral carotid artery stenosis status post endarterectomy, ambulatory dysfunction uses a walker, ovarian cancer status post hysterectomy in 2014 who presented to [facility] ED as a transfer from [facility] ED due to concern for possible stroke. 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 No chest imaging. Type of Study: Bedside Swallow Evaluation Diet Prior to this Study: Regular;Thin liquids Temperature Spikes Noted: No Respiratory Status: Room air History of Recent Intubation: No Behavior/Cognition: Cooperative;Alert;Pleasant mood Oral Cavity Assessment: Within Functional Limits Oral Care Completed by SLP: No Oral Cavity - Dentition: Adequate natural dentition Vision: Functional for self-feeding Self-Feeding Abilities: Able to feed self Patient Positioning: Upright in bed Baseline Vocal Quality: Normal Volitional Cough: Strong Volitional Swallow: Able to elicit    Oral/Motor/Sensory Function Overall Oral Motor/Sensory Function: Within functional limits Facial ROM: Within Functional Limits Facial Symmetry: Within Functional Limits Lingual ROM: Within Functional Limits Lingual Symmetry: Within Functional Limits Lingual Strength: Within Functional Limits Velum: Within Functional Limits Mandible: Within Functional Limits   Ice Chips Ice chips: Not tested   Thin Liquid Thin Liquid: Within functional limits Presentation: Straw    Nectar Thick Nectar Thick Liquid: Not tested   Honey Thick  Honey Thick Liquid: Not tested    Puree Puree: Within functional limits Presentation: Spoon   Solid     Solid: Within functional limits Presentation: Self Fed      Kerrie Pleasure, MA, CCC-SLP Acute Rehabilitation Services Office: [phone]  09/10/2019,12:34 PM

## 2019-09-10 NOTE — Progress Notes (Signed)
  Echocardiogram 2D Echocardiogram has been performed.  Leta Jungling M 09/10/2019, 8:29 AM

## 2019-09-10 NOTE — Progress Notes (Addendum)
[facility]  PROGRESS NOTE  [place] ZOX:[id number] DOB: [birth year] DOA: 09/09/2019 PCP: Brooke Bonito, MD   Brief HPI:   79 year old female with medical history for essential hypertension, hyperlipidemia, tobacco use, hypothyroidism, bilateral carotid artery stenosis status post endarterectomy, ambulatory dysfunction uses a walker, ovarian cancer status post hysterectomy in 2014 who presented to [facility], ED as transfer from [facility] ED due to concern for possible stroke.  Patient came with right-sided frontal headache with onset 5 days ago this was associated with blurred vision.  At [facility] ED CT of the head showed area of low density in the posteromedial right parietal lobe/occipital lobe concerning for age-indeterminate infarct.  She was transferred to [facility], ED for MRI brain.  Neurology/stroke team was consulted. MRI brain showed 2.8 cm early subacute ischemic infarct within the right PCA territory affecting the paramedian right parietal occipital lobes and callosal splenium.  Suspect mild petechial hemorrhage at this time.  Additional subcentimeter acute/early subacute infarct in the right thalamus, posterior right temporal lobe and left occipital lobe.    Subjective   Patient seen and examined, still has blurred vision.   Assessment/Plan:     1. Posterior circulation infarct-continue aspirin, Plavix, atorvastatin, fenofibrate, will obtain 2D echo, carotid Dopplers.  Neurology is following.  Frequent neurochecks.  Telemetry monitoring. 2. Acute kidney injury-unknown baseline, creatinine improved from 1.45-1.27.  Continue normal saline at 50 cc/h. 3. Bilateral carotid artery stenosis s/p endarterectomy-patient has had surgery 10 years ago.  Will obtain carotid ultrasound. 4. Essential hypertension-permissive hypertension, treat blood pressure greater than 220/120. 5. Hypokalemia-potassium was 3.4, will replace potassium and follow BMP in  a.m. 6. Hypothyroidism-continue levothyroxine.  TSH 0.740.   Scheduled medications:   . [START ON 09/11/2019] aspirin EC  81 mg Oral Daily  . atorvastatin  40 mg Oral Daily  . Chlorhexidine Gluconate Cloth  6 each Topical Daily  . clopidogrel  75 mg Oral Daily  . fenofibrate  54 mg Oral Daily  . levothyroxine  100 mcg Oral QAC breakfast         CBG: Recent Labs  Lab 09/09/19 0918 09/09/19 2120 09/09/19 2336 09/10/19 0615  GLUCAP 97 110* 99 106*    SpO2: 94 %    CBC: Recent Labs  Lab 09/09/19 0953 09/10/19 0411  WBC 9.9 7.5  NEUTROABS 5.4  --   HGB 12.5 11.0*  HCT 37.7 33.8*  MCV 87.1 86.9  PLT 395 370    Basic Metabolic Panel: Recent Labs  Lab 09/09/19 0953 09/10/19 0411  NA 138 140  K 4.7 3.4*  CL 101 102  CO2 29 26  GLUCOSE 97 91  BUN 23 19  CREATININE 1.45* 1.27*  CALCIUM 10.4* 9.2     Liver Function Tests: Recent Labs  Lab 09/09/19 0953  AST 35  ALT 11  ALKPHOS 37*  BILITOT 0.5  PROT 7.1  ALBUMIN 4.3     Antibiotics: Anti-infectives (From admission, onward)   None       DVT prophylaxis: SCDs  Code Status: Full code  Family Communication: Discussed with patient's husband at bedside    Status is: Inpatient  Dispo: The patient is from: Home              Anticipated d/c is to: Home              Anticipated d/c date is: 09/11/2019              Patient currently  undergoing work-up for stroke  Barrier to discharge-undergoing work-up for stroke.      Consultants:  Neurology  Procedures:     Objective   Vitals:   09/09/19 2314 09/10/19 0355 09/10/19 0733 09/10/19 1124  BP: (!) 143/59 (!) 153/61 (!) 148/60 138/62  Pulse: 81 81 84 87  Resp: 18 18 18 18   Temp: (!) 97.5 F (36.4 C) 97.6 F (36.4 C) 98 F (36.7 C) 97.6 F (36.4 C)  TempSrc: Oral Oral Oral Oral  SpO2: 97% 96% 97% 94%    Intake/Output Summary (Last 24 hours) at 09/10/2019 1434 Last data filed at 09/10/2019 1300 Gross per 24 hour  Intake  1011.26 ml  Output --  Net 1011.26 ml    08/04 1901 - 08/06 0700 In: 1531.3 [P.O.:340; I.V.:191.3] Out: -   There were no vitals filed for this visit.  Physical Examination:    General: Appears in no acute distress  Cardiovascular: S1-S2, regular, no murmur auscultated  Respiratory: Clear to auscultation bilaterally  Abdomen: Abdomen is soft, nontender, no organomegaly  Extremities: No edema in the lower extremities  Neurologic: Alert, oriented x3, cranial nerves II to XII grossly intact    Data Reviewed:   Recent Results (from the past 240 hour(s))  SARS Coronavirus 2 by RT PCR (hospital order, performed in [facility] hospital lab) Nasopharyngeal Nasopharyngeal Swab     Status: None   Collection Time: 09/09/19  4:37 PM   Specimen: Nasopharyngeal Swab  Result Value Ref Range Status   SARS Coronavirus 2 NEGATIVE NEGATIVE Final    Comment: (NOTE) SARS-CoV-2 target nucleic acids are NOT DETECTED.  The SARS-CoV-2 RNA is generally detectable in upper and lower respiratory specimens during the acute phase of infection. The lowest concentration of SARS-CoV-2 viral copies this assay can detect is 250 copies / mL. A negative result does not preclude SARS-CoV-2 infection and should not be used as the sole basis for treatment or other patient management decisions.  A negative result may occur with improper specimen collection / handling, submission of specimen other than nasopharyngeal swab, presence of viral mutation(s) within the areas targeted by this assay, and inadequate number of viral copies (<250 copies / mL). A negative result must be combined with clinical observations, patient history, and epidemiological information.  Fact Sheet for Patients:   11/09/19  Fact Sheet for Healthcare Providers: BoilerBrush.com.cy  This test is not yet approved or  cleared by the https://pope.com/ FDA and has been authorized  for detection and/or diagnosis of SARS-CoV-2 by FDA under an Emergency Use Authorization (EUA).  This EUA will remain in effect (meaning this test can be used) for the duration of the COVID-19 declaration under Section 564(b)(1) of the Act, 21 U.S.C. section 360bbb-3(b)(1), unless the authorization is terminated or revoked sooner.  Performed at [facility], [address], [place], [place]      Studies:  CT Head Wo Contrast  Result Date: 09/09/2019 CLINICAL DATA:  Headache, blurred vision EXAM: CT HEAD WITHOUT CONTRAST TECHNIQUE: Contiguous axial images were obtained from the base of the skull through the vertex without intravenous contrast. COMPARISON:  09/04/2018 FINDINGS: Brain: Area of low-density noted in the posteromedial parietal lobe/occipital lobe on the right concerning for infarct, age indeterminate. There is atrophy and chronic small vessel disease changes. No hydrocephalus or hemorrhage. Vascular: No hyperdense vessel or unexpected calcification. Skull: No acute calvarial abnormality. Sinuses/Orbits: Visualized paranasal sinuses and mastoids clear. Orbital soft tissues unremarkable. Other: None IMPRESSION: Area low-density  in the posteromedial right parietal lobe/occipital lobe concerning for age-indeterminate infarct. Atrophy, chronic small vessel disease. Electronically Signed   By: Charlett NoseKevin  Dover M.D.   On: 09/09/2019 09:57   MR ANGIO HEAD WO CONTRAST  Result Date: 09/10/2019 CLINICAL DATA:  Stroke follow-up EXAM: MRA HEAD WITHOUT CONTRAST TECHNIQUE: Angiographic images of the Circle of Willis were obtained using MRA technique without intravenous contrast. COMPARISON:  Brain MRI from yesterday FINDINGS: Right dominant vertebral artery. Dominant left PICA and right AICA. Hypoplastic right A1 segment. No branch occlusion, generalized beading, or aneurysm. Atheromatous irregularity/narrowing seen to the bilateral ICA, M1 segments, right proximal A2 segment, and  especially to the left distal A2 segment where there is high-grade narrowing. Mild atheromatous changes likely present at the bilateral P4 level. Patchy T1 hyperintensity in the right occipital cortex at sites of acute and subacute infarction (at the occipital pole there is infarct changes by MRI that are not diffusion hyperintense). No discrete hematoma IMPRESSION: 1. No emergent vascular finding. Symmetric, robust flow in the right PCA. 2. Intracranial atherosclerosis as described above. No flow reducing and correctable narrowing. Electronically Signed   By: Marnee SpringJonathon  Watts M.D.   On: 09/10/2019 04:51   MR ANGIO NECK WO CONTRAST  Result Date: 09/10/2019 CLINICAL DATA:  Occipital infarct on the right. EXAM: MRA NECK WITHOUTCONTRAST TECHNIQUE: Angiographic images of the neck were obtained using MRA technique without intravenous contrast. COMPARISON:  None. FINDINGS: Normal appearance of the covered arch.  Three vessel branching. Turbulent flow at the right more than left ICA bifurcation attributed to atherosclerosis. Quantification at right ICA stenosis is limited due to degree of artifact and signal dropout. No evidence of proximal subclavian stenosis. The right vertebral artery is dominant. Both vertebral arteries show robust flow to the dura. Early branching left PICA. IMPRESSION: 1. No flow reducing stenosis or embolic source seen in the posterior circulation. 2. Atheromatous stenosis and turbulent flow at the right cervical carotid bifurcation with quantification limited by the degree of artifact. Electronically Signed   By: Marnee SpringJonathon  Watts M.D.   On: 09/10/2019 04:55   MR Brain W and Wo Contrast  Result Date: 09/09/2019 CLINICAL DATA:  Neuro deficit, acute, stroke suspected; brain mass or lesion. Additional provided: Patient reports headache and blurred vision for 1 week, unable to see primary medical doctor, patient states vision grossly intact but fuzzy. EXAM: MRI HEAD WITHOUT AND WITH CONTRAST  TECHNIQUE: Multiplanar, multiecho pulse sequences of the brain and surrounding structures were obtained without and with intravenous contrast. CONTRAST:  7mL GADAVIST GADOBUTROL 1 MMOL/ML IV SOLN COMPARISON:  Noncontrast head CT 09/09/2019, CT angiogram head/neck 09/04/2018. FINDINGS: Brain: Moderate generalized parenchymal atrophy 2.8 cm region of cortically based restricted diffusion within the paramedian right parietooccipital lobes and callosal splenium on the right, within the right PCA vascular territory. There is corresponding T2/FLAIR hyperintensity and enhancement at these sites. These findings are most consistent with early subacute right PCA territory ischemic infarcts. There is a small focus of SWI signal loss within the infarction territory which may reflect mild petechial hemorrhage. Additional acute/early subacute infarcts within the right thalamus (series 2, image 24), posterior right temporal lobe (series 2, image 18) and left occipital lobe (series 2, image 16). Background mild patchy T2/FLAIR hyperintensity within the cerebral white matter is nonspecific, but consistent with chronic small vessel ischemic disease. Additional scattered supratentorial chronic microhemorrhages likely reflecting sequela of hypertensive microangiopathy. No evidence of intracranial mass. No extra-axial fluid collection. No midline shift. Vascular: No definite loss of expected  proximal arterial flow voids. Skull and upper cervical spine: No focal marrow lesion. Sinuses/Orbits: Visualized orbits show no acute finding. Prior left lens replacement. Mild paranasal sinus mucosal thickening, most notably ethmoid. Trace fluid within right mastoid air cells. IMPRESSION: 2.8 cm early subacute ischemic infarct within the right PCA territory affecting the paramedian right parietooccipital lobes and callosal splenium. Suspected mild petechial hemorrhage at this site. Additional subcentimeter acute/early subacute infarcts within the  right thalamus, posterior right temporal lobe and left occipital lobe. Background moderate generalized parenchymal atrophy and mild chronic small vessel ischemic disease. Mild paranasal sinus mucosal thickening. Trace fluid within right mastoid air cells. Electronically Signed   By: Jackey Loge DO   On: 09/09/2019 15:56   ECHOCARDIOGRAM COMPLETE  Result Date: 09/10/2019    ECHOCARDIOGRAM REPORT   Patient Name:   [facility] Date of Exam: 09/10/2019 Medical Rec #:  [id number]   Height:       62.0 in Accession #:    [id number]  Weight:       154.0 lb Date of Birth:  [birth year]    BSA:          1.711 m Patient Age:    79 years    BP:           148/60 mmHg Patient Gender: F           HR:           84 bpm. Exam Location:  Inpatient Procedure: 2D Echo Indications:    Stroke 434.91 / I163.9  History:        Patient has no prior history of Echocardiogram examinations.                 Risk Factors:Hypertension. Thyroid disease. GERD.  Sonographer:    Leta Jungling RDCS Referring Phys: [id number] CAROLE N HALL IMPRESSIONS  1. Left ventricular ejection fraction, by estimation, is 60 to 65%. The left ventricle has normal function. The left ventricle has no regional wall motion abnormalities. Left ventricular diastolic parameters are consistent with Grade I diastolic dysfunction (impaired relaxation). Elevated left atrial pressure.  2. Right ventricular systolic function is normal. The right ventricular size is normal.  3. Left atrial size was mildly dilated.  4. The mitral valve is normal in structure. Trivial mitral valve regurgitation. No evidence of mitral stenosis.  5. The aortic valve is tricuspid. Aortic valve regurgitation is mild. Mild aortic valve sclerosis is present, with no evidence of aortic valve stenosis.  6. The inferior vena cava is normal in size with greater than 50% respiratory variability, suggesting right atrial pressure of 3 mmHg. FINDINGS  Left Ventricle: Left ventricular ejection fraction, by estimation,  is 60 to 65%. The left ventricle has normal function. The left ventricle has no regional wall motion abnormalities. The left ventricular internal cavity size was normal in size. There is  no left ventricular hypertrophy. Left ventricular diastolic parameters are consistent with Grade I diastolic dysfunction (impaired relaxation). Elevated left atrial pressure. Right Ventricle: The right ventricular size is normal. Right ventricular systolic function is normal. Left Atrium: Left atrial size was mildly dilated. Right Atrium: Right atrial size was normal in size. Pericardium: There is no evidence of pericardial effusion. Mitral Valve: The mitral valve is normal in structure. Normal mobility of the mitral valve leaflets. Mild mitral annular calcification. Trivial mitral valve regurgitation. No evidence of mitral valve stenosis. Tricuspid Valve: The tricuspid valve is normal in structure. Tricuspid valve regurgitation is mild . No evidence of  tricuspid stenosis. Aortic Valve: The aortic valve is tricuspid. Aortic valve regurgitation is mild. Aortic regurgitation PHT measures 451 msec. Mild aortic valve sclerosis is present, with no evidence of aortic valve stenosis. Pulmonic Valve: The pulmonic valve was not well visualized. Pulmonic valve regurgitation is not visualized. No evidence of pulmonic stenosis. Aorta: The aortic root is normal in size and structure. Venous: The inferior vena cava is normal in size with greater than 50% respiratory variability, suggesting right atrial pressure of 3 mmHg. IAS/Shunts: No atrial level shunt detected by color flow Doppler.  LEFT VENTRICLE PLAX 2D LVIDd:         4.10 cm  Diastology LVIDs:         3.00 cm  LV e' lateral:   5.77 cm/s LV PW:         0.90 cm  LV E/e' lateral: 18.2 LV IVS:        0.90 cm  LV e' medial:    5.22 cm/s LVOT diam:     1.60 cm  LV E/e' medial:  20.1 LV SV:         53 LV SV Index:   31 LVOT Area:     2.01 cm  RIGHT VENTRICLE TAPSE (M-mode): 1.6 cm LEFT ATRIUM              Index       RIGHT ATRIUM           Index LA diam:        4.00 cm 2.34 cm/m  RA Area:     10.30 cm LA Vol (A2C):   54.9 ml 32.09 ml/m RA Volume:   19.80 ml  11.57 ml/m LA Vol (A4C):   59.5 ml 34.78 ml/m LA Biplane Vol: 59.3 ml 34.66 ml/m  AORTIC VALVE LVOT Vmax:   145.00 cm/s LVOT Vmean:  90.000 cm/s LVOT VTI:    0.266 m AI PHT:      451 msec  AORTA Ao Root diam: 2.50 cm MITRAL VALVE MV Area (PHT): 3.21 cm     SHUNTS MV Decel Time: 236 msec     Systemic VTI:  0.27 m MV E velocity: 105.00 cm/s  Systemic Diam: 1.60 cm MV A velocity: 109.00 cm/s MV E/A ratio:  0.96 Olga Millers MD Electronically signed by Olga Millers MD Signature Date/Time: 09/10/2019/10:31:44 AM    Final        Meredeth Ide   [facility] If 7PM-7AM, please contact night-coverage at [email or web address], Office  [phone]   09/10/2019, 2:34 PM  LOS: 0 days

## 2019-09-10 NOTE — Progress Notes (Signed)
PT Cancellation Note  Patient Details Name: Karina [name] MRN: [id number] DOB: 1940/12/27   Cancelled Treatment:    Reason Eval/Treat Not Completed: Fatigue/lethargy limiting ability to participate  Patient just finishing with OT evaluation and returned to bed due to fatigue. Will return later today for PT evaluation.   Jerolyn Center, PT Pager [phone]  Zena Amos 09/10/2019, 9:26 AM

## 2019-09-10 NOTE — Consult Note (Addendum)
ELECTROPHYSIOLOGY CONSULT NOTE  Patient ID: Karina [name] MRN: [id number], DOB/AGE: [birth year]   Admit date: 09/09/2019 Date of Consult: 09/10/2019  Primary Physician: Brooke Bonito, MD Primary Cardiologist: none Reason for Consultation: Cryptogenic stroke ; recommendations regarding Implantable Loop Recorder, requested by Dr. Pearlean Brownie  History of Present Illness Karina [name] was admitted on 09/09/2019 with headaches, vision changes, found with stroke.    PMHx includes: HTN, hypothyroidism, GERD, former smoker, PVD with h/o CEA, ovarian cancer (treated surgically)  Neurology noted:  R PCA infarct w/ petechial hemorrhage and severe small B infarcts embolic secondary to unknown source, suspect AF.  she has undergone workup for stroke including echocardiogram and carotid dopplers.  The patient has been monitored on telemetry which has demonstrated sinus rhythm with no arrhythmias.  Inpatient stroke work-up is to be completed with a TEE.   Echocardiogram this admission demonstrated   IMPRESSIONS  1. Left ventricular ejection fraction, by estimation, is 60 to 65%. The  left ventricle has normal function. The left ventricle has no regional  wall motion abnormalities. Left ventricular diastolic parameters are  consistent with Grade I diastolic  dysfunction (impaired relaxation). Elevated left atrial pressure.  2. Right ventricular systolic function is normal. The right ventricular  size is normal.  3. Left atrial size was mildly dilated.  4. The mitral valve is normal in structure. Trivial mitral valve  regurgitation. No evidence of mitral stenosis.  5. The aortic valve is tricuspid. Aortic valve regurgitation is mild.  Mild aortic valve sclerosis is present, with no evidence of aortic valve  stenosis.  6. The inferior vena cava is normal in size with greater than 50%  respiratory variability, suggesting right atrial pressure of 3 mmHg.    Lab work is reviewed.   Prior to admission,  the patient denies chest pain, shortness of breath, dizziness, she has rare fleeting palpitations, and in 2018 wore a monitor for a syncopal event, this was reportedly normal and she has fainted a couple times but not in "a very long time"..  They are recovering from their stroke with plans to home at discharge.   Past Medical History:  Diagnosis Date   GERD (gastroesophageal reflux disease)    Hypertension    Thyroid disease      Surgical History:  Past Surgical History:  Procedure Laterality Date   ABDOMINAL HYSTERECTOMY     APPENDECTOMY     CHOLECYSTECTOMY     EYE SURGERY       Medications Prior to Admission  Medication Sig Dispense Refill Last Dose   aspirin EC 81 MG tablet Take 81 mg by mouth daily. Swallow whole.   09/08/2019 at Unknown time   calcium gluconate 500 MG tablet Take 1 tablet by mouth 2 (two) times daily.   09/09/2019   cholecalciferol (VITAMIN D3) 25 MCG (1000 UNIT) tablet Take 1,000 Units by mouth daily.   09/09/2019   Cinnamon 500 MG capsule Take 500 mg by mouth daily.   09/09/2019   fenofibrate (TRICOR) 145 MG tablet Take 145 mg by mouth daily.   09/09/2019   Garlic (GARLIC 7829) 1500 MG CAPS Take 1 capsule by mouth daily.   09/09/2019   hydrochlorothiazide (MICROZIDE) 12.5 MG capsule Take 12.5 mg by mouth daily.   09/09/2019   Levothyroxine Sodium (SYNTHROID PO) Take 1 tablet by mouth daily.    09/09/2019   metoprolol tartrate (LOPRESSOR) 25 MG tablet Take 25 mg by mouth 2 (two) times daily.   09/09/2019 at 7am  pantoprazole (PROTONIX) 40 MG tablet Take 40 mg by mouth 2 (two) times daily before a meal.    09/09/2019   traMADol (ULTRAM) 50 MG tablet Take 50 mg by mouth 2 (two) times daily as needed for moderate pain.    09/09/2019    Inpatient Medications:   [START ON 09/11/2019] aspirin EC  81 mg Oral Daily   atorvastatin  40 mg Oral Daily   clopidogrel  75 mg Oral Daily   fenofibrate  54 mg Oral Daily   levothyroxine  100 mcg Oral QAC breakfast     Allergies:  Allergies  Allergen Reactions   Erythromycin     itching   Escitalopram Other (See Comments)    insomnia   Bupropion Itching and Other (See Comments)    insomnia Unknown Unknown insomnia     Social History   Socioeconomic History   Marital status: Married    Spouse name: Not on file   Number of children: Not on file   Years of education: Not on file   Highest education level: Not on file  Occupational History   Not on file  Tobacco Use   Smoking status: Never Smoker   Smokeless tobacco: Never Used  Substance and Sexual Activity   Alcohol use: Never   Drug use: Never   Sexual activity: Not on file  Other Topics Concern   Not on file  Social History Narrative   Not on file   Social Determinants of Health   Financial Resource Strain:    Difficulty of Paying Living Expenses:   Food Insecurity:    Worried About Programme researcher, broadcasting/film/video in the Last Year:    Barista in the Last Year:   Transportation Needs:    Freight forwarder (Medical):    Lack of Transportation (Non-Medical):   Physical Activity:    Days of Exercise per Week:    Minutes of Exercise per Session:   Stress:    Feeling of Stress :   Social Connections:    Frequency of Communication with Friends and Family:    Frequency of Social Gatherings with Friends and Family:    Attends Religious Services:    Active Member of Clubs or Organizations:    Attends Engineer, structural:    Marital Status:   Intimate Partner Violence:    Fear of Current or Ex-Partner:    Emotionally Abused:    Physically Abused:    Sexually Abused:      Family History  Problem Relation Age of Onset   Stroke Maternal Grandfather       Review of Systems:  All other systems reviewed and are otherwise negative except as noted above.  Physical Exam: Vitals:   09/09/19 2314 09/10/19 0355 09/10/19 0733 09/10/19 1124  BP: (!) 143/59 (!) 153/61 (!) 148/60  138/62  Pulse: 81 81 84 87  Resp: Temp: (!) 97.5 F (36.4 C) 97.6 F (36.4 C) 98 F (36.7 C) 97.6 F (36.4 C)  TempSrc: Oral Oral Oral Oral  SpO2: 97% 96% 97% 94%    GEN- The patient is well appearing, alert and oriented x 3 today.   Head- normocephalic, atraumatic Eyes-  Sclera clear, conjunctiva pink Ears- hearing intact Oropharynx- clear Neck- supple Lungs- CTA b/l, normal work of breathing Heart- RRR, no murmurs, rubs or gallops  GI- soft, NT, ND Extremities- no clubbing, cyanosis, or edema MS- no significant deformity, age appropriate atrophy Skin- no rash  or lesion Psych- euthymic mood, full affect   Labs:   Lab Results  Component Value Date   WBC 7.5 09/10/2019   HGB 11.0 (L) 09/10/2019   HCT 33.8 (L) 09/10/2019   MCV 86.9 09/10/2019   PLT 370 09/10/2019    Recent Labs  Lab 09/09/19 0953 09/09/19 0953 09/10/19 0411  NA 138   < > 140  K 4.7   < > 3.4*  CL 101   < > 102  CO2 29   < > 26  BUN 23   < > 19  CREATININE 1.45*   < > 1.27*  CALCIUM 10.4*   < > 9.2  PROT 7.1  --   --   BILITOT 0.5  --   --   ALKPHOS 37*  --   --   ALT 11  --   --   AST 35  --   --   GLUCOSE 97   < > 91   < > = values in this interval not displayed.   No results found for: CKTOTAL, CKMB, CKMBINDEX, TROPONINI Lab Results  Component Value Date   CHOL 159 09/09/2019   Lab Results  Component Value Date   HDL 28 (L) 09/09/2019   Lab Results  Component Value Date   LDLCALC 84 09/09/2019   Lab Results  Component Value Date   TRIG 233 (H) 09/09/2019   Lab Results  Component Value Date   CHOLHDL 5.7 09/09/2019   No results found for: LDLDIRECT  No results found for: DDIMER   Radiology/Studies:   CT Head Wo Contrast Result Date: 09/09/2019 CLINICAL DATA:  Headache, blurred vision EXAM: CT HEAD WITHOUT CONTRAST TECHNIQUE: Contiguous axial images were obtained from the base of the skull through the vertex without intravenous contrast. COMPARISON:   09/04/2018 FINDINGS: Brain: Area of low-density noted in the posteromedial parietal lobe/occipital lobe on the right concerning for infarct, age indeterminate. There is atrophy and chronic small vessel disease changes. No hydrocephalus or hemorrhage. Vascular: No hyperdense vessel or unexpected calcification. Skull: No acute calvarial abnormality. Sinuses/Orbits: Visualized paranasal sinuses and mastoids clear. Orbital soft tissues unremarkable. Other: None IMPRESSION: Area low-density in the posteromedial right parietal lobe/occipital lobe concerning for age-indeterminate infarct. Atrophy, chronic small vessel disease. Electronically Signed   By: Charlett NoseKevin  Dover M.D.   On: 09/09/2019 09:57     MR ANGIO HEAD WO CONTRAST Result Date: 09/10/2019 CLINICAL DATA:  Stroke follow-up EXAM: MRA HEAD WITHOUT CONTRAST TECHNIQUE: Angiographic images of the Circle of Willis were obtained using MRA technique without intravenous contrast. COMPARISON:  Brain MRI from yesterday FINDINGS: Right dominant vertebral artery. Dominant left PICA and right AICA. Hypoplastic right A1 segment. No branch occlusion, generalized beading, or aneurysm. Atheromatous irregularity/narrowing seen to the bilateral ICA, M1 segments, right proximal A2 segment, and especially to the left distal A2 segment where there is high-grade narrowing. Mild atheromatous changes likely present at the bilateral P4 level. Patchy T1 hyperintensity in the right occipital cortex at sites of acute and subacute infarction (at the occipital pole there is infarct changes by MRI that are not diffusion hyperintense). No discrete hematoma IMPRESSION: 1. No emergent vascular finding. Symmetric, robust flow in the right PCA. 2. Intracranial atherosclerosis as described above. No flow reducing and correctable narrowing. Electronically Signed   By: Marnee SpringJonathon  Watts M.D.   On: 09/10/2019 04:51    MR ANGIO NECK WO CONTRAST Result Date: 09/10/2019 CLINICAL DATA:  Occipital infarct  on the right. EXAM: MRA NECK WITHOUTCONTRAST  TECHNIQUE: Angiographic images of the neck were obtained using MRA technique without intravenous contrast. COMPARISON:  None. FINDINGS: Normal appearance of the covered arch.  Three vessel branching. Turbulent flow at the right more than left ICA bifurcation attributed to atherosclerosis. Quantification at right ICA stenosis is limited due to degree of artifact and signal dropout. No evidence of proximal subclavian stenosis. The right vertebral artery is dominant. Both vertebral arteries show robust flow to the dura. Early branching left PICA. IMPRESSION: 1. No flow reducing stenosis or embolic source seen in the posterior circulation. 2. Atheromatous stenosis and turbulent flow at the right cervical carotid bifurcation with quantification limited by the degree of artifact. Electronically Signed   By: Marnee Spring M.D.   On: 09/10/2019 04:55     MR Brain W and Wo Contrast Result Date: 09/09/2019 CLINICAL DATA:  Neuro deficit, acute, stroke suspected; brain mass or lesion. Additional provided: Patient reports headache and blurred vision for 1 week, unable to see primary medical doctor, patient states vision grossly intact but fuzzy. EXAM: MRI HEAD WITHOUT AND WITH CONTRAST TECHNIQUE: Multiplanar, multiecho pulse sequences of the brain and surrounding structures were obtained without and with intravenous contrast. CONTRAST:  74mL GADAVIST GADOBUTROL 1 MMOL/ML IV SOLN COMPARISON:  Noncontrast head CT 09/09/2019, CT angiogram head/neck 09/04/2018. FINDINGS: Brain: Moderate generalized parenchymal atrophy 2.8 cm region of cortically based restricted diffusion within the paramedian right parietooccipital lobes and callosal splenium on the right, within the right PCA vascular territory. There is corresponding T2/FLAIR hyperintensity and enhancement at these sites. These findings are most consistent with early subacute right PCA territory ischemic infarcts. There is a small  focus of SWI signal loss within the infarction territory which may reflect mild petechial hemorrhage. Additional acute/early subacute infarcts within the right thalamus (series 2, image 24), posterior right temporal lobe (series 2, image 18) and left occipital lobe (series 2, image 16). Background mild patchy T2/FLAIR hyperintensity within the cerebral white matter is nonspecific, but consistent with chronic small vessel ischemic disease. Additional scattered supratentorial chronic microhemorrhages likely reflecting sequela of hypertensive microangiopathy. No evidence of intracranial mass. No extra-axial fluid collection. No midline shift. Vascular: No definite loss of expected proximal arterial flow voids. Skull and upper cervical spine: No focal marrow lesion. Sinuses/Orbits: Visualized orbits show no acute finding. Prior left lens replacement. Mild paranasal sinus mucosal thickening, most notably ethmoid. Trace fluid within right mastoid air cells. IMPRESSION: 2.8 cm early subacute ischemic infarct within the right PCA territory affecting the paramedian right parietooccipital lobes and callosal splenium. Suspected mild petechial hemorrhage at this site. Additional subcentimeter acute/early subacute infarcts within the right thalamus, posterior right temporal lobe and left occipital lobe. Background moderate generalized parenchymal atrophy and mild chronic small vessel ischemic disease. Mild paranasal sinus mucosal thickening. Trace fluid within right mastoid air cells. Electronically Signed   By: Jackey Loge DO   On: 09/09/2019 15:56     12-lead ECG SR All prior EKG's in EPIC reviewed with no documented atrial fibrillation  Telemetry SR  11/15/2016: she wore a 24 monitor via [facility]: reported as normal, done for syncope, I can not see images  Assessment and Plan:  1. Cryptogenic stroke The patient presents with cryptogenic stroke.  Neurology has deferred TEE.  I spoke at length with the patient  (her sone and husband) about monitoring for afib with either a 30 day event monitor or an implantable loop recorder.  Risks, benefits, and alteratives to implantable loop recorder were discussed with the patient  today.   At this time, the patient is undecided, she Damian Hofstra discuss further with the MD, Dr. Elberta Fortis or Dr. Lalla Brothers Wesleigh Markovic be by later this afternoon to see and discuss further.    Sheilah Pigeon, PA-C 09/10/2019  I have seen and examined this patient with Francis Dowse.  Agree with above, note added to reflect my findings.  On exam, RRR, no murmurs, lungs clear.  Patient presented to the hospital with cryptogenic stroke. To date, no cause has been found.  If unrevealing, [place] Natarajan plan for LINQ monitor to look for atrial fibrillation. Risks and benefits discussed. Risks include but not limited to bleeding and infection. The patient understands the risks and has agreed to the procedure.  Kendryck Lacroix M. Carsen Machi MD 09/10/2019 4:00 PM

## 2019-09-10 NOTE — Progress Notes (Signed)
VAST consulted to obtain IV access for procedure. VAST RN called vascular US and spoke with Clint Guy stated IV access is not needed for the procedure pt has ordered. Informed pt's nurse, Neshell.

## 2019-09-10 NOTE — Evaluation (Signed)
Physical Therapy Evaluation Patient Details Name: Karina [name] MRN: [id number] DOB: Feb 27, 1940 Today's Date: 09/10/2019   History of Present Illness  79 y.o. female with medical history significant for essential hypertension, hyperlipidemia, former tobacco user quit in 2014, hypothyroidism, bilateral carotid artery stenosis status post endarterectomy, ambulatory dysfunction uses a walker, ovarian cancer status post hysterectomy in 2014 who presented to [facility] ED as a transfer from [facility] ED due to concern for possible stroke. MRI brain showed 2.8 cm early subacute ischemic infarct within the R PCA territory affecting the paramedian R parietooccipital lobes and callosal splenium. Suspected mild petechial hemorrhage at this site. Additional subcentimeter acute/early subacute infarcts within the R thalamus, posterior R temporal lobe and L occipital lobe  Clinical Impression   Pt admitted with above diagnosis. Patient endorses fatigue due to limited sleep last night. PTA she was modified independent inside her home using her RW (and supervision with use of rollator in the community). She currently demonstrates mild weakness LLE and describes a sense of global weakness. She requires up to min assist for mobility with husband present during evaluation and agrees he can provide necessary assist.  Pt currently with functional limitations due to the deficits listed below (see PT Problem List). Pt will benefit from skilled PT to increase their independence and safety with mobility to allow discharge to the venue listed below.       Follow Up Recommendations Home health PT;Supervision for mobility/OOB    Equipment Recommendations  None recommended by PT    Recommendations for Other Services       Precautions / Restrictions Precautions Precautions: Fall Precaution Comments: reports vision is blurry      Mobility  Bed Mobility Overal bed mobility: Needs Assistance Bed Mobility: Supine to  Sit;Rolling;Sidelying to Sit Rolling: Supervision (with cues) Sidelying to sit: Min guard Supine to sit:  (pt struggling to attempt; cued for roll, side t sit)     General bed mobility comments: pt initially wanting to use HOB to elevate HOB however does not have an adjustable bed at home. From flat, she required cues to process how to get OOB  Transfers Overall transfer level: Needs assistance Equipment used: Rolling walker (2 wheeled) Transfers: Sit to/from Stand Sit to Stand: Min guard         General transfer comment: completed x2 with min guard for safety; vc for hand placement; able to stand and return to sit without use of UEs  Ambulation/Gait Ambulation/Gait assistance: Min guard Gait Distance (Feet): 15 Feet (25) Assistive device: Rolling walker (2 wheeled) Gait Pattern/deviations: Step-through pattern;Decreased stride length;Shuffle     General Gait Details: no imbalance noted; required vc for safest approach to sink with RW  Stairs            Wheelchair Mobility    Modified Rankin (Stroke Patients Only) Modified Rankin (Stroke Patients Only) Pre-Morbid Rankin Score: Moderate disability Modified Rankin: Moderately severe disability     Balance Overall balance assessment: Needs assistance Sitting-balance support: Feet supported;Bilateral upper extremity supported Sitting balance-Leahy Scale: Good Sitting balance - Comments: able to don socks at EOB   Standing balance support: No upper extremity supported;During functional activity Standing balance-Leahy Scale: Fair Standing balance comment: supervision with static standing; min-guard with incr sway with standing eyes closed         Rhomberg - Eyes Opened:  (required assist to obtain position and unable to hold)     High Level Balance Comments: one foot in front with ~4" step width x  20 sec no UE support;              Pertinent Vitals/Pain Pain Assessment: Faces Faces Pain Scale: No hurt     Home Living Family/patient expects to be discharged to:: Private residence Living Arrangements: Spouse/significant other Available Help at Discharge: Friend(s);Available 24 hours/day Type of Home: House Home Access: Stairs to enter Entrance Stairs-Rails: Left Entrance Stairs-Number of Steps: 1 Home Layout: One level Home Equipment: Walker - 2 wheels;Walker - 4 wheels;Grab bars - toilet Additional Comments: Pt husband present to confirm assist at home    Prior Function Level of Independence: Independent with assistive device(s)         Comments: Pt reports being independent with ADLs but using RW for mobility in the home and rollator in community. Pt does not attempt to get into tub and gives herself sponge baths instead for safety.     Hand Dominance   Dominant Hand: Right    Extremity/Trunk Assessment   Upper Extremity Assessment Upper Extremity Assessment: Defer to OT evaluation    Lower Extremity Assessment Lower Extremity Assessment: LLE deficits/detail LLE Deficits / Details: knee extension 4/5, ankle DF 5/5 LLE Sensation: WNL    Cervical / Trunk Assessment Cervical / Trunk Assessment: Normal  Communication   Communication: HOH  Cognition Arousal/Alertness: Awake/alert Behavior During Therapy: WFL for tasks assessed/performed Overall Cognitive Status: Within Functional Limits for tasks assessed                                 General Comments: Pt with good safety awareness and taking time with mobility.       General Comments General comments (skin integrity, edema, etc.): Husband present    Exercises     Assessment/Plan    PT Assessment Patient needs continued PT services  PT Problem List Decreased strength;Decreased balance;Decreased mobility;Decreased knowledge of use of DME       PT Treatment Interventions DME instruction;Gait training;Functional mobility training;Stair training;Therapeutic activities;Balance  training;Neuromuscular re-education;Patient/family education    PT Goals (Current goals can be found in the Care Plan section)  Acute Rehab PT Goals Patient Stated Goal: feel better PT Goal Formulation: With patient/family Time For Goal Achievement: 09/17/19 Potential to Achieve Goals: Good    Frequency Min 4X/week   Barriers to discharge        Co-evaluation               AM-PAC PT "6 Clicks" Mobility  Outcome Measure Help needed turning from your back to your side while in a flat bed without using bedrails?: A Little Help needed moving from lying on your back to sitting on the side of a flat bed without using bedrails?: A Little Help needed moving to and from a bed to a chair (including a wheelchair)?: A Little Help needed standing up from a chair using your arms (e.g., wheelchair or bedside chair)?: None Help needed to walk in hospital room?: A Little Help needed climbing 3-5 steps with a railing? : A Little 6 Click Score: 19    End of Session Equipment Utilized During Treatment: Gait belt Activity Tolerance: Patient tolerated treatment well Patient left: in chair;with call bell/phone within reach;with family/visitor present Nurse Communication: Mobility status PT Visit Diagnosis: Unsteadiness on feet (R26.81)    Time: 1340-1405 PT Time Calculation (min) (ACUTE ONLY): 25 min   Charges:   PT Evaluation $PT Eval Low Complexity: 1 Low PT Treatments $Therapeutic Activity:  8-22 mins         Jerolyn Center, PT Pager [phone]   Zena Amos 09/10/2019, 2:18 PM

## 2019-09-10 NOTE — Care Management Obs Status (Signed)
MEDICARE OBSERVATION STATUS NOTIFICATION   Patient Details  Name: Karina [name] MRN: [id number] Date of Birth: 1940-06-26   Medicare Observation Status Notification Given:  Yes    Kermit Balo, RN 09/10/2019, 4:19 PM

## 2019-09-13 ENCOUNTER — Encounter (HOSPITAL_COMMUNITY): Payer: Self-pay | Admitting: Cardiology

## 2019-09-23 ENCOUNTER — Other Ambulatory Visit: Payer: Self-pay

## 2019-09-23 ENCOUNTER — Ambulatory Visit (INDEPENDENT_AMBULATORY_CARE_PROVIDER_SITE_OTHER): Payer: Medicare HMO | Admitting: Emergency Medicine

## 2019-09-23 DIAGNOSIS — I639 Cerebral infarction, unspecified: Secondary | ICD-10-CM

## 2019-09-23 LAB — CUP PACEART INCLINIC DEVICE CHECK
Date Time Interrogation Session: 20210819091008
Implantable Pulse Generator Implant Date: 20210806

## 2019-09-23 NOTE — Progress Notes (Signed)
ILR wound check in clinic. Steri strips removed. Wound well healed. R-waves 0.17- 0.50mV. Home monitor transmitting nightly. 2 pause episodes that recorded during implant of device that are false.. Questions answered.

## 2019-09-30 ENCOUNTER — Telehealth: Payer: Self-pay

## 2019-09-30 NOTE — Telephone Encounter (Signed)
ILR alert received 09/29/19.  ILR implanted for CVA. New finding of AF on 09/28/19 at 20:43 pm. Lasted total of 1 hour 46 minutes. No OAC per med list. Called patient to assess, reports of feeling a flutter in her chest during that time. Patient advised someone from the AF Clinic will call her to set up apt. Patient agreeable to plan.  Consulted to Dr. Elberta Fortis, reviewed and agrees AF episode. He would like for patient to f/u with AF Clinic.

## 2019-09-30 NOTE — Telephone Encounter (Signed)
Called and spoke with patient.  She is aware of appt 10/01/19 @10 :30 with , NP.

## 2019-10-01 ENCOUNTER — Encounter (HOSPITAL_COMMUNITY): Payer: Self-pay | Admitting: Nurse Practitioner

## 2019-10-01 ENCOUNTER — Other Ambulatory Visit: Payer: Self-pay

## 2019-10-01 ENCOUNTER — Ambulatory Visit (HOSPITAL_COMMUNITY)
Admission: RE | Admit: 2019-10-01 | Discharge: 2019-10-01 | Disposition: A | Discharge status: Home / Self Care | Payer: Medicare HMO | Source: Ambulatory Visit | Attending: Nurse Practitioner | Admitting: Nurse Practitioner

## 2019-10-01 VITALS — BP 160/70 | HR 68 | Ht 62.0 in | Wt 144.2 lb

## 2019-10-01 DIAGNOSIS — K219 Gastro-esophageal reflux disease without esophagitis: Secondary | ICD-10-CM | POA: Insufficient documentation

## 2019-10-01 DIAGNOSIS — Z9049 Acquired absence of other specified parts of digestive tract: Secondary | ICD-10-CM | POA: Insufficient documentation

## 2019-10-01 DIAGNOSIS — Z95818 Presence of other cardiac implants and grafts: Secondary | ICD-10-CM | POA: Diagnosis not present

## 2019-10-01 DIAGNOSIS — I779 Disorder of arteries and arterioles, unspecified: Secondary | ICD-10-CM | POA: Diagnosis not present

## 2019-10-01 DIAGNOSIS — Z7901 Long term (current) use of anticoagulants: Secondary | ICD-10-CM | POA: Insufficient documentation

## 2019-10-01 DIAGNOSIS — D6869 Other thrombophilia: Secondary | ICD-10-CM

## 2019-10-01 DIAGNOSIS — Z8673 Personal history of transient ischemic attack (TIA), and cerebral infarction without residual deficits: Secondary | ICD-10-CM | POA: Insufficient documentation

## 2019-10-01 DIAGNOSIS — Z9079 Acquired absence of other genital organ(s): Secondary | ICD-10-CM | POA: Insufficient documentation

## 2019-10-01 DIAGNOSIS — Z823 Family history of stroke: Secondary | ICD-10-CM | POA: Diagnosis not present

## 2019-10-01 DIAGNOSIS — I4891 Unspecified atrial fibrillation: Secondary | ICD-10-CM | POA: Insufficient documentation

## 2019-10-01 DIAGNOSIS — Z79899 Other long term (current) drug therapy: Secondary | ICD-10-CM | POA: Diagnosis not present

## 2019-10-01 DIAGNOSIS — E079 Disorder of thyroid, unspecified: Secondary | ICD-10-CM | POA: Diagnosis not present

## 2019-10-01 DIAGNOSIS — Z79890 Hormone replacement therapy (postmenopausal): Secondary | ICD-10-CM | POA: Insufficient documentation

## 2019-10-01 DIAGNOSIS — I10 Essential (primary) hypertension: Secondary | ICD-10-CM | POA: Insufficient documentation

## 2019-10-01 MED ORDER — APIXABAN 5 MG PO TABS: 5.0000 mg | ORAL_TABLET | Freq: Two times a day (BID) | ORAL | 3 refills | Status: DC

## 2019-10-01 NOTE — Patient Instructions (Signed)
STOP plavix  START Eliquis 5mg twice a day 

## 2019-10-01 NOTE — Progress Notes (Addendum)
Primary Care Physician: Brooke Bonito, MD Referring Physician: Dr. [facility]   Karina [name] is a 79 y.o. 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 A  Linq  was implanted by Dr. Elberta Fortis at the time and recently showed 1 hour 46 mins of afib. She states that she did feel a flutter in her chest around that time. She is on metoprolol 25 mg bid . She is in the afib clinic to discuss change of anticoagulation.  She was discharged on plavix and asa. She took asa x 3 weeks and is now off. EKG shows SR today.   Today, she denies symptoms of palpitations, chest pain, shortness of breath, orthopnea, PND, lower extremity edema, dizziness, presyncope, syncope, or neurologic sequela. The patient is tolerating medications without difficulties and is otherwise without complaint today.   Past Medical History:  Diagnosis Date  . GERD (gastroesophageal reflux disease)   . Hypertension   . Thyroid disease    Past Surgical History:  Procedure Laterality Date  . ABDOMINAL HYSTERECTOMY    . APPENDECTOMY    . CHOLECYSTECTOMY    . EYE SURGERY    . LOOP RECORDER INSERTION N/A 09/10/2019   Procedure: LOOP RECORDER INSERTION;  Surgeon: Regan Lemming, MD;  Location: [facility];  Service: Cardiovascular;  Laterality: N/A;    Current Outpatient Medications  Medication Sig Dispense Refill  . atorvastatin (LIPITOR) 40 MG tablet Take 1 tablet (40 mg total) by mouth daily. 30 tablet 2  . calcium carbonate (OS-CAL) 1250 (500 Ca) MG chewable tablet Chew 2 tablets by mouth daily.     . calcium gluconate 500 MG tablet Take 1 tablet by mouth 2 (two) times daily.    . cholecalciferol (VITAMIN D3) 25 MCG (1000 UNIT) tablet Take 1,000 Units by mouth daily.    . Cinnamon 500 MG capsule Take 500 mg by mouth daily.    . clobetasol cream (TEMOVATE) 0.05 % APPLY TO AFFECTED AREA TWICE DAILY FOR 2  WEEKS THEN DAILY FOR 2 WEEKS AND THEN 3 TIMES A WEEK    . fenofibrate 54 MG tablet Take 1 tablet (54 mg total) by mouth daily. 30 tablet 2  . Garlic (GARLIC 3500) 1500 MG CAPS Take 1 capsule by mouth daily.    . hydrochlorothiazide (MICROZIDE) 12.5 MG capsule Take 12.5 mg by mouth daily.    Marland Kitchen levothyroxine (SYNTHROID) 100 MCG tablet     . magnesium oxide (MAG-OX) 400 MG tablet Take 400 mg by mouth daily.    . metoprolol tartrate (LOPRESSOR) 25 MG tablet Take 25 mg by mouth 2 (two) times daily.    . NON FORMULARY Take 1 tablet by mouth daily. Calcium, Vitamin D- Daily    . pantoprazole (PROTONIX) 40 MG tablet Take 40 mg by mouth 2 (two) times daily before a meal.     . traMADol (ULTRAM) 50 MG tablet Take 50 mg by mouth as needed for moderate pain.     . vitamin B-12 (CYANOCOBALAMIN) 1000 MCG tablet Take 1,000 mcg by mouth daily.    Marland Kitchen apixaban (ELIQUIS) 5 MG TABS tablet Take 1 tablet (5 mg total) by mouth 2 (two) times daily. 60 tablet 3   No current facility-administered medications for this encounter.    Allergies  Allergen Reactions  . Erythromycin     itching  . Escitalopram Other (See Comments)    insomnia  .  Bupropion Itching and Other (See Comments)    insomnia Unknown Unknown insomnia     Social History   Socioeconomic History  . Marital status: Married    Spouse name: Not on file  . Number of children: Not on file  . Years of education: Not on file  . Highest education level: Not on file  Occupational History  . Not on file  Tobacco Use  . Smoking status: Never Smoker  . Smokeless tobacco: Never Used  Substance and Sexual Activity  . Alcohol use: Never  . Drug use: Never  . Sexual activity: Not on file  Other Topics Concern  . Not on file  Social History Narrative  . Not on file   Social Determinants of Health   Financial Resource Strain:   . Difficulty of Paying Living Expenses: Not on file  Food Insecurity:   . Worried About Programme researcher, broadcasting/film/video in the  Last Year: Not on file  . Ran Out of Food in the Last Year: Not on file  Transportation Needs:   . Lack of Transportation (Medical): Not on file  . Lack of Transportation (Non-Medical): Not on file  Physical Activity:   . Days of Exercise per Week: Not on file  . Minutes of Exercise per Session: Not on file  Stress:   . Feeling of Stress : Not on file  Social Connections:   . Frequency of Communication with Friends and Family: Not on file  . Frequency of Social Gatherings with Friends and Family: Not on file  . Attends Religious Services: Not on file  . Active Member of Clubs or Organizations: Not on file  . Attends Banker Meetings: Not on file  . Marital Status: Not on file  Intimate Partner Violence:   . Fear of Current or Ex-Partner: Not on file  . Emotionally Abused: Not on file  . Physically Abused: Not on file  . Sexually Abused: Not on file    Family History  Problem Relation Age of Onset  . Stroke Maternal Grandfather     ROS- All systems are reviewed and negative except as per the HPI above  Physical Exam: Vitals:   10/01/19 1026  BP: (!) 160/70  Pulse: 68  Weight: 65.4 kg  Height: 5\' 2"  (1.575 m)   Wt Readings from Last 3 Encounters:  10/01/19 65.4 kg  11/11/17 69.9 kg    Labs: Lab Results  Component Value Date   NA 140 09/10/2019   K 3.4 (L) 09/10/2019   CL 102 09/10/2019   CO2 26 09/10/2019   GLUCOSE 91 09/10/2019   BUN 19 09/10/2019   CREATININE 1.27 (H) 09/10/2019   CALCIUM 9.2 09/10/2019   No results found for: INR Lab Results  Component Value Date   CHOL 159 09/09/2019   HDL 28 (L) 09/09/2019   LDLCALC 84 09/09/2019   TRIG 233 (H) 09/09/2019     GEN- The patient is well appearing, alert and oriented x 3 today.   Head- normocephalic, atraumatic Eyes-  Sclera clear, conjunctiva pink Ears- hearing intact Oropharynx- clear Neck- supple, no JVP Lymph- no cervical lymphadenopathy Lungs- Clear to ausculation bilaterally,  normal work of breathing Heart- Regular rate and rhythm, no murmurs, rubs or gallops, PMI not laterally displaced GI- soft, NT, ND, + BS Extremities- no clubbing, cyanosis, or edema MS- no significant deformity or atrophy Skin- no rash or lesion Psych- euthymic mood, full affect Neuro- strength and sensation are intact  EKG- NSR at  68 bpm, pr int 158 ms, qrs int 76 ms, qtc 448 ms  Device clinic report -09/29/19-ILR implanted for CVA. New finding of AF on 09/28/19 at 20:43 pm. Lasted total of 1 hour 46 minutes. No OAC per med list. Called patient to assess, reports of feeling a flutter in her chest during that time. Patient advised someone from the AF Clinic will call her to set up apt. Patient agreeable to plan.  Consulted to Dr. Elberta Fortis, reviewed and agrees AF episode. He would like for patient to f/u with AF Clinic.   Assessment and Plan: 1. New onset  afib s/p recent CVA General education re afib  Will continue metoprolol 25 mg bid  Continue linq monitoring via device clinic   2. CHA2DS2VASc of 5 Will stop plavix and start eliquis 5 mg bid General precautions regarding bleeding risk  Given 30 day free supply card to show at pharmacy with RX  Will see back in 3 weeks with bmet/cbc  F/u with Ihor Austin, NP Neurology, 8/31  [facility] Sidle. [facility] [address] [place], [place] [address] [phone]

## 2019-10-05 ENCOUNTER — Inpatient Hospital Stay: Payer: Medicare HMO | Admitting: Adult Health

## 2019-10-12 ENCOUNTER — Inpatient Hospital Stay: Payer: Medicare HMO | Admitting: Adult Health

## 2019-10-14 ENCOUNTER — Ambulatory Visit: Payer: Medicare HMO | Admitting: Adult Health

## 2019-10-14 ENCOUNTER — Encounter: Payer: Self-pay | Admitting: Adult Health

## 2019-10-14 VITALS — BP 139/68 | HR 66 | Ht 62.0 in | Wt 144.4 lb

## 2019-10-14 DIAGNOSIS — E785 Hyperlipidemia, unspecified: Secondary | ICD-10-CM | POA: Diagnosis not present

## 2019-10-14 DIAGNOSIS — I63431 Cerebral infarction due to embolism of right posterior cerebral artery: Secondary | ICD-10-CM | POA: Diagnosis not present

## 2019-10-14 DIAGNOSIS — I480 Paroxysmal atrial fibrillation: Secondary | ICD-10-CM

## 2019-10-14 DIAGNOSIS — I10 Essential (primary) hypertension: Secondary | ICD-10-CM

## 2019-10-14 NOTE — Patient Instructions (Addendum)
Continue working with physical therapy for continued strengthening and gait difficulty. If you feel like additional therapy would be beneficial, please call office and orders can be placed for additional outpatient therapy  Continue Eliquis (apixaban) daily  and atorvastatin 40 mg daily for secondary stroke prevention  Continue to follow with cardiology for atrial fibrillation and Eliquis management  Continue to follow up with PCP regarding cholesterol and blood pressure management  Maintain strict control of hypertension with blood pressure goal below 130/90 and cholesterol with LDL cholesterol (bad cholesterol) goal below 70 mg/dL.       Followup in the future with me in 4 months or call earlier if needed       Thank you for coming to see [place] at [facility]. I hope we have been able to provide you high quality care today.  You may receive a patient satisfaction survey over the next few weeks. We would appreciate your feedback and comments so that we may continue to improve ourselves and the health of our patients.     Stroke Prevention Some medical conditions and behaviors are associated with a higher chance of having a stroke. You can help prevent a stroke by making nutrition, lifestyle, and other changes, including managing any medical conditions you may have. What nutrition changes can be made?   Eat healthy foods. You can do this by: ? Choosing foods high in fiber, such as fresh fruits and vegetables and whole grains. ? Eating at least 5 or more servings of fruits and vegetables a day. Try to fill half of your plate at each meal with fruits and vegetables. ? Choosing lean protein foods, such as lean cuts of meat, poultry without skin, fish, tofu, beans, and nuts. ? Eating low-fat dairy products. ? Avoiding foods that are high in salt (sodium). This can help lower blood pressure. ? Avoiding foods that have saturated fat, trans fat, and cholesterol. This  can help prevent high cholesterol. ? Avoiding processed and premade foods.  Follow your health care provider's specific guidelines for losing weight, controlling high blood pressure (hypertension), lowering high cholesterol, and managing diabetes. These may include: ? Reducing your daily calorie intake. ? Limiting your daily sodium intake to 1,500 milligrams (mg). ? Using only healthy fats for cooking, such as olive oil, canola oil, or sunflower oil. ? Counting your daily carbohydrate intake. What lifestyle changes can be made?  Maintain a healthy weight. Talk to your health care provider about your ideal weight.  Get at least 30 minutes of moderate physical activity at least 5 days a week. Moderate activity includes brisk walking, biking, and swimming.  Do not use any products that contain nicotine or tobacco, such as cigarettes and e-cigarettes. If you need help quitting, ask your health care provider. It may also be helpful to avoid exposure to secondhand smoke.  Limit alcohol intake to no more than 1 drink a day for nonpregnant women and 2 drinks a day for men. One drink equals 12 oz of beer, 5 oz of wine, or 1 oz of hard liquor.  Stop any illegal drug use.  Avoid taking birth control pills. Talk to your health care provider about the risks of taking birth control pills if: ? You are over 15 years old. ? You smoke. ? You get migraines. ? You have ever had a blood clot. What other changes can be made?  Manage your cholesterol levels. ? Eating a healthy diet is important for preventing high cholesterol. If cholesterol  cannot be managed through diet alone, you may also need to take medicines. ? Take any prescribed medicines to control your cholesterol as told by your health care provider.  Manage your diabetes. ? Eating a healthy diet and exercising regularly are important parts of managing your blood sugar. If your blood sugar cannot be managed through diet and exercise, you may  need to take medicines. ? Take any prescribed medicines to control your diabetes as told by your health care provider.  Control your hypertension. ? To reduce your risk of stroke, try to keep your blood pressure below 130/80. ? Eating a healthy diet and exercising regularly are an important part of controlling your blood pressure. If your blood pressure cannot be managed through diet and exercise, you may need to take medicines. ? Take any prescribed medicines to control hypertension as told by your health care provider. ? Ask your health care provider if you should monitor your blood pressure at home. ? Have your blood pressure checked every year, even if your blood pressure is normal. Blood pressure increases with age and some medical conditions.  Get evaluated for sleep disorders (sleep apnea). Talk to your health care provider about getting a sleep evaluation if you snore a lot or have excessive sleepiness.  Take over-the-counter and prescription medicines only as told by your health care provider. Aspirin or blood thinners (antiplatelets or anticoagulants) may be recommended to reduce your risk of forming blood clots that can lead to stroke.  Make sure that any other medical conditions you have, such as atrial fibrillation or atherosclerosis, are managed. What are the warning signs of a stroke? The warning signs of a stroke can be easily remembered as BEFAST.  B is for balance. Signs include: ? Dizziness. ? Loss of balance or coordination. ? Sudden trouble walking.  E is for eyes. Signs include: ? A sudden change in vision. ? Trouble seeing.  F is for face. Signs include: ? Sudden weakness or numbness of the face. ? The face or eyelid drooping to one side.  A is for arms. Signs include: ? Sudden weakness or numbness of the arm, usually on one side of the body.  S is for speech. Signs include: ? Trouble speaking (aphasia). ? Trouble understanding.  T is for time. ? These  symptoms may represent a serious problem that is an emergency. Do not wait to see if the symptoms will go away. Get medical help right away. Call your local emergency services (911 in the [place] Do not drive yourself to the hospital.  Other signs of stroke may include: ? A sudden, severe headache with no known cause. ? Nausea or vomiting. ? Seizure. Where to find more information For more information, visit:  American Stroke Association: www.strokeassociation.org  National Stroke Association: www.stroke.org Summary  You can prevent a stroke by eating healthy, exercising, not smoking, limiting alcohol intake, and managing any medical conditions you may have.  Do not use any products that contain nicotine or tobacco, such as cigarettes and e-cigarettes. If you need help quitting, ask your health care provider. It may also be helpful to avoid exposure to secondhand smoke.  Remember BEFAST for warning signs of stroke. Get help right away if you or a loved one has any of these signs. This information is not intended to replace advice given to you by your health care provider. Make sure you discuss any questions you have with your health care provider. Document Revised: 01/03/2017 Document Reviewed: 02/27/2016 Elsevier Patient  Education  [employer].

## 2019-10-14 NOTE — Progress Notes (Signed)
[facility] [address] [place].  [address] [phone]       HOSPITAL FOLLOW UP NOTE  [name] Date of Birth:  1940-06-20 Medical Record Number:  [id number]   Reason for Referral:  hospital stroke follow up    SUBJECTIVE:   CHIEF COMPLAINT:  Chief Complaint  Patient presents with  . Hospitalization Follow-up    HFU after stroke  . treatment room    with husband    HPI:   [name] is a 79 y.o. female with history of HTN, thyroid disease and GERD  who presented on 09/09/2019 with 1 week history of blurred vision.  Stroke work-up revealed R PCA infarct with petechial hemorrhage and several small B infarcts, embolic secondary to unknown source with suspicion of A. fib.  Loop recorder placed to further evaluate for atrial fibrillation as potential etiology.  Recommended DAPT for 3 weeks then Plavix alone.  HTN stable.  LDL 84 and initiated atorvastatin 40 mg daily.  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 Small vessel disease. Atrophy.   MRI w/w/o R PCA infarct w/ petechial hemorrhage. Additional infarcts R thalamus, posterior R temporal lobe and L occipital lobe. Small vessel disease. Atrophy. Sinus dz. Mastoid fluid.     MRA  No LVO. Good flow R PCA. Intracranial atherosclerosis   MRA neck no significant stenosis. 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   2D Echo EF 60-65%. No source of embolus   Loop recorder placed  LDL 84  HgbA1c 5.9  VTE prophylaxis - SCDs   aspirin 81 mg daily prior to admission, now on aspirin 325 mg daily. Decrease aspirin to 81 and add plavi.x. continue DAPT x 3 weeks then plavix alone   Therapy recommendations:   HH OT/PT  Disposition: Home  Today, 10/14/2019, Karina [name] is being seen for hospital follow-up accompanied by her husband  Patient reports residual deficits of mild blurred vision which has been slowly improving She continues to work with [facility] PT for gait impairment which is chronic and believes she is back to her baseline. She continues to ambulate with RW which is her baseline Denies new or worsening stroke/TIA symptoms  Loop recorder recently showed atrial fibrillation therefore cardiology initiated Eliquis 5 mg twice daily and discontinue Plavix.  She has remained on Eliquis without bleeding or bruising Continues on atorvastatin 40 mg daily without myalgias Blood pressure today 139/68  No concerns at this time     ROS:   14 system review of systems performed and negative with exception of blurred vision and gait impairment  PMH:  Past Medical History:  Diagnosis Date  . GERD (gastroesophageal reflux disease)   . Hypertension   . Thyroid disease     PSH:  Past Surgical History:  Procedure Laterality Date  . ABDOMINAL HYSTERECTOMY    . APPENDECTOMY    . CHOLECYSTECTOMY    . EYE SURGERY    . LOOP RECORDER INSERTION N/A 09/10/2019   Procedure: LOOP RECORDER INSERTION;  Surgeon: Regan Lemming, MD;  Location: [facility];  Service: Cardiovascular;  Laterality: N/A;    Social History:  Social History   Socioeconomic History  . Marital status:  Married    Spouse name: Not on file  . Number of children: Not on file  . Years of education: Not on file  . Highest education level: Not on file  Occupational History  . Not on file  Tobacco Use  . Smoking status: Never Smoker  . Smokeless tobacco: Never Used  Substance and Sexual Activity  . Alcohol use: Never  . Drug use: Never  . Sexual activity: Not on file  Other Topics Concern  . Not on file  Social History Narrative  . Not on file   Social Determinants of Health   Financial Resource Strain:   .  Difficulty of Paying Living Expenses: Not on file  Food Insecurity:   . Worried About Programme researcher, broadcasting/film/video in the Last Year: Not on file  . Ran Out of Food in the Last Year: Not on file  Transportation Needs:   . Lack of Transportation (Medical): Not on file  . Lack of Transportation (Non-Medical): Not on file  Physical Activity:   . Days of Exercise per Week: Not on file  . Minutes of Exercise per Session: Not on file  Stress:   . Feeling of Stress : Not on file  Social Connections:   . Frequency of Communication with Friends and Family: Not on file  . Frequency of Social Gatherings with Friends and Family: Not on file  . Attends Religious Services: Not on file  . Active Member of Clubs or Organizations: Not on file  . Attends Banker Meetings: Not on file  . Marital Status: Not on file  Intimate Partner Violence:   . Fear of Current or Ex-Partner: Not on file  . Emotionally Abused: Not on file  . Physically Abused: Not on file  . Sexually Abused: Not on file    Family History:  Family History  Problem Relation Age of Onset  . Stroke Maternal Grandfather     Medications:   Current Outpatient Medications on File Prior to Visit  Medication Sig Dispense Refill  . apixaban (ELIQUIS) 5 MG TABS tablet Take 1 tablet (5 mg total) by mouth 2 (two) times daily. 60 tablet 3  . atorvastatin (LIPITOR) 40 MG tablet Take 1 tablet (40 mg total) by mouth daily. 30 tablet 2  . calcium carbonate (OS-CAL) 1250 (500 Ca) MG chewable tablet Chew 2 tablets by mouth daily.     . calcium gluconate 500 MG tablet Take 1 tablet by mouth 2 (two) times daily.    . cholecalciferol (VITAMIN D3) 25 MCG (1000 UNIT) tablet Take 1,000 Units by mouth daily.    . Cinnamon 500 MG capsule Take 500 mg by mouth daily.    . clobetasol cream (TEMOVATE) 0.05 % APPLY TO AFFECTED AREA TWICE DAILY FOR 2 WEEKS THEN DAILY FOR 2 WEEKS AND THEN 3 TIMES A WEEK    . fenofibrate 54 MG tablet Take 1 tablet (54 mg  total) by mouth daily. 30 tablet 2  . Garlic (GARLIC 2993) 1500 MG CAPS Take 1 capsule by mouth daily.    . hydrochlorothiazide (MICROZIDE) 12.5 MG capsule Take 12.5 mg by mouth daily.    Marland Kitchen levothyroxine (SYNTHROID) 100 MCG tablet     . magnesium oxide (MAG-OX) 400 MG tablet Take 400 mg by mouth daily.    . metoprolol tartrate (LOPRESSOR) 25 MG tablet Take 25 mg by mouth 2 (two) times daily.    . NON FORMULARY Take 1 tablet by mouth daily. Calcium, Vitamin D- Daily    .  pantoprazole (PROTONIX) 40 MG tablet Take 40 mg by mouth 2 (two) times daily before a meal.     . traMADol (ULTRAM) 50 MG tablet Take 50 mg by mouth as needed for moderate pain.     . vitamin B-12 (CYANOCOBALAMIN) 1000 MCG tablet Take 1,000 mcg by mouth daily.     No current facility-administered medications on file prior to visit.    Allergies:   Allergies  Allergen Reactions  . Erythromycin     itching  . Escitalopram Other (See Comments)    insomnia  . Bupropion Itching and Other (See Comments)    insomnia Unknown Unknown insomnia       OBJECTIVE:  Physical Exam  Vitals:   10/14/19 1108  BP: 139/68  Pulse: 66  Weight: 144 lb 6.4 oz (65.5 kg)  Height: 5\' 2"  (1.575 m)   Body mass index is 26.41 kg/m. No exam data present  General: well developed, well nourished,  very pleasant elderly Caucasian female, seated, in no evident distress Head: head normocephalic and atraumatic.   Neck: supple with no carotid or supraclavicular bruits Cardiovascular: regular rate and rhythm, no murmurs Musculoskeletal: no deformity Skin:  no rash/petichiae Vascular:  Normal pulses all extremities   Neurologic Exam Mental Status: Awake and fully alert.  Fluent speech and language. Oriented to place and time. Recent and remote memory intact. Attention span, concentration and fund of knowledge appropriate. Mood and affect appropriate.  Cranial Nerves: Fundoscopic exam reveals sharp disc margins. Pupils equal, briskly  reactive to light. Extraocular movements full without nystagmus. Visual fields full to confrontation (unable to appreciate documented left homonymous hemianopsia during hospitalization). Hearing intact. Facial sensation intact. Face, tongue, palate moves normally and symmetrically.  Motor: Normal bulk and tone. Normal strength in all tested extremity muscles. Sensory.: intact to touch , pinprick , position and vibratory sensation.  Coordination: Rapid alternating movements normal in all extremities. Finger-to-nose and heel-to-shin performed accurately bilaterally. Gait and Station: Arises from chair without difficulty. Stance is normal. Gait demonstrates  short shuffled steps with normal balance with use of rolling walker. Tandem walk not attempted Reflexes: 1+ and symmetric. Toes downgoing.     NIHSS  0 Modified Rankin  2 CHA2DS2-VASc 5 HAS-BLED 2     ASSESSMENT: Karina [name] is a 79 y.o. year old female presented with 1 week history of blurred vision on 09/09/2019 with stroke work-up revealing R PCA infarct with petechial hemorrhage and several small B infarcts, embolic secondary to unknown source therefore loop recorder placed. Loop recorder recently showed atrial fibrillation and placed on Eliquis. Vascular risk factors include new dx of A. Fib, HTN, HLD and B ICA stenosis s/p R CEA 2000 and L CEA 2011.      PLAN:  1. R PCA stroke:  a. Residual deficit: Subjective blurred vision but unable to appreciate left homonymous hemianopia which was found during hospitalization -she does report continued improvement of vision. Also has chronic gait abnormality currently working with [facility] PT -patient believes gait is currently at baseline.  b. Loop recorder showed evidence of atrial fibrillation with CHA2DS2-VASc 5 therefore Eliquis initiated c. Continue Eliquis (apixaban) daily  and atorvastatin 40 mg daily for secondary stroke prevention. Close PCP follow up for aggressive stroke risk factor  management  2. Atrial fibrillation: Likely stroke etiology recently found on loop recorder. Continue Eliquis with routine follow-up with cardiology for prescribing and monitoring 3. HTN: BP goal <130/90. Continue f/u with PCP 4. HLD: LDL goal <70. Recent LDL 84. Continue  atorvastatin and fenofibrate. F/u with PCP for management as well as prescribing of statin   Follow up in 4 months or call earlier if needed   I spent 45 minutes of face-to-face and non-face-to-face time with patient and husband.  This included previsit chart review, lab review, study review, order entry, electronic health record documentation, patient education regarding recent stroke, residual deficits, new dx of atrial fibrillation in regards to stroke etiology and use of Eliquis, importance of managing stroke risk factors and answered all questions to patient and husband's satisfaction     Ihor AustinJessica McCue, AGNP-BC  [facility] [address] [place], [place] [id number]  Phone [phone] Fax [phone] Note: This document was prepared with digital dictation and possible smart phrase technology. Any transcriptional errors that result from this process are unintentional.

## 2019-10-14 NOTE — Progress Notes (Signed)
I agree with the above plan 

## 2019-10-18 ENCOUNTER — Ambulatory Visit (INDEPENDENT_AMBULATORY_CARE_PROVIDER_SITE_OTHER): Payer: Medicare HMO | Admitting: *Deleted

## 2019-10-18 DIAGNOSIS — I639 Cerebral infarction, unspecified: Secondary | ICD-10-CM

## 2019-10-18 LAB — CUP PACEART REMOTE DEVICE CHECK
Date Time Interrogation Session: 20210911214529
Implantable Pulse Generator Implant Date: 20210806

## 2019-10-19 NOTE — Progress Notes (Signed)
Carelink Summary Report / Loop Recorder 

## 2019-10-21 ENCOUNTER — Telehealth: Payer: Self-pay | Admitting: *Deleted

## 2019-10-21 MED ORDER — METOPROLOL TARTRATE 50 MG PO TABS: 50.0000 mg | ORAL_TABLET | Freq: Two times a day (BID) | ORAL | 1 refills | Status: DC

## 2019-10-21 NOTE — Telephone Encounter (Signed)
-----   Message from Will Jorja Loa, MD sent at 10/18/2019 10:41 AM EDT ----- Abnormal LINQ reviewed. Notable for rapid AF. Increae metoprolol to 50 mg BID.

## 2019-10-21 NOTE — Telephone Encounter (Signed)
Informed patient of results and verbal understanding expressed.  

## 2019-10-22 ENCOUNTER — Other Ambulatory Visit: Payer: Self-pay

## 2019-10-22 ENCOUNTER — Ambulatory Visit (HOSPITAL_COMMUNITY)
Admission: RE | Admit: 2019-10-22 | Discharge: 2019-10-22 | Disposition: A | Discharge status: Home / Self Care | Payer: Medicare HMO | Source: Ambulatory Visit | Attending: Nurse Practitioner | Admitting: Nurse Practitioner

## 2019-10-22 ENCOUNTER — Encounter (HOSPITAL_COMMUNITY): Payer: Self-pay | Admitting: Nurse Practitioner

## 2019-10-22 VITALS — BP 150/68 | HR 62 | Ht 62.0 in | Wt 144.2 lb

## 2019-10-22 DIAGNOSIS — I10 Essential (primary) hypertension: Secondary | ICD-10-CM | POA: Diagnosis not present

## 2019-10-22 DIAGNOSIS — E079 Disorder of thyroid, unspecified: Secondary | ICD-10-CM | POA: Insufficient documentation

## 2019-10-22 DIAGNOSIS — Z79899 Other long term (current) drug therapy: Secondary | ICD-10-CM | POA: Insufficient documentation

## 2019-10-22 DIAGNOSIS — R9431 Abnormal electrocardiogram [ECG] [EKG]: Secondary | ICD-10-CM | POA: Insufficient documentation

## 2019-10-22 DIAGNOSIS — Z8673 Personal history of transient ischemic attack (TIA), and cerebral infarction without residual deficits: Secondary | ICD-10-CM | POA: Diagnosis not present

## 2019-10-22 DIAGNOSIS — I252 Old myocardial infarction: Secondary | ICD-10-CM | POA: Insufficient documentation

## 2019-10-22 DIAGNOSIS — D6869 Other thrombophilia: Secondary | ICD-10-CM

## 2019-10-22 DIAGNOSIS — K219 Gastro-esophageal reflux disease without esophagitis: Secondary | ICD-10-CM | POA: Diagnosis not present

## 2019-10-22 DIAGNOSIS — Z7901 Long term (current) use of anticoagulants: Secondary | ICD-10-CM | POA: Diagnosis not present

## 2019-10-22 DIAGNOSIS — I4891 Unspecified atrial fibrillation: Secondary | ICD-10-CM | POA: Diagnosis present

## 2019-10-22 LAB — CBC
HCT: 39 % (ref 36.0–46.0)
Hemoglobin: 12.5 g/dL (ref 12.0–15.0)
MCH: 28.6 pg (ref 26.0–34.0)
MCHC: 32.1 g/dL (ref 30.0–36.0)
MCV: 89.2 fL (ref 80.0–100.0)
Platelets: 452 10*3/uL — ABNORMAL HIGH (ref 150–400)
RBC: 4.37 MIL/uL (ref 3.87–5.11)
RDW: 14.3 % (ref 11.5–15.5)
WBC: 10.5 10*3/uL (ref 4.0–10.5)
nRBC: 0 % (ref 0.0–0.2)

## 2019-10-22 LAB — BASIC METABOLIC PANEL WITH GFR
Anion gap: 12 (ref 5–15)
BUN: 19 mg/dL (ref 8–23)
CO2: 29 mmol/L (ref 22–32)
Calcium: 9.9 mg/dL (ref 8.9–10.3)
Chloride: 100 mmol/L (ref 98–111)
Creatinine, Ser: 1.34 mg/dL — ABNORMAL HIGH (ref 0.44–1.00)
GFR calc Af Amer: 44 mL/min — ABNORMAL LOW
GFR calc non Af Amer: 38 mL/min — ABNORMAL LOW
Glucose, Bld: 104 mg/dL — ABNORMAL HIGH (ref 70–99)
Potassium: 4.1 mmol/L (ref 3.5–5.1)
Sodium: 141 mmol/L (ref 135–145)

## 2019-10-22 MED ORDER — APIXABAN 5 MG PO TABS
5.0000 mg | ORAL_TABLET | Freq: Two times a day (BID) | ORAL | 2 refills | Status: DC
Start: 2019-10-22 — End: 2020-12-05

## 2019-10-22 MED ORDER — METOPROLOL TARTRATE 50 MG PO TABS
50.0000 mg | ORAL_TABLET | Freq: Two times a day (BID) | ORAL | 1 refills | Status: DC
Start: 2019-10-22 — End: 2020-03-21

## 2019-10-22 NOTE — Progress Notes (Addendum)
Primary Care Physician: Brooke Bonito, MD Referring Physician: Dr. [facility]   Karina [name] is a 79 y.o. 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 A  Linq  was implanted by Dr. Elberta Fortis at the time and recently showed 1 hour 46 mins of afib. She states that she did feel a flutter in her chest around that time. She is on metoprolol 25 mg bid . She is in the afib clinic to discuss change of anticoagulation.  She was discharged on plavix and asa. She took asa x 3 weeks and is now off. EKG shows SR today.   F/u in afib clinic, 9/17. She has been on anticoagulation  for 3-4 weeks. Noted small amount of bloody vaginal d/c x one. No other signs of bleeding. She was noted per her Linq  that she had a bout of afib with RVR yesterday.  BB was increased and today she is back in SR.   Today, she denies symptoms of palpitations, chest pain, shortness of breath, orthopnea, PND, lower extremity edema, dizziness, presyncope, syncope, or neurologic sequela. The patient is tolerating medications without difficulties and is otherwise without complaint today.   Past Medical History:  Diagnosis Date  . GERD (gastroesophageal reflux disease)   . Hypertension   . Thyroid disease    Past Surgical History:  Procedure Laterality Date  . ABDOMINAL HYSTERECTOMY    . APPENDECTOMY    . CHOLECYSTECTOMY    . EYE SURGERY    . LOOP RECORDER INSERTION N/A 09/10/2019   Procedure: LOOP RECORDER INSERTION;  Surgeon: Regan Lemming, MD;  Location: [facility];  Service: Cardiovascular;  Laterality: N/A;    Current Outpatient Medications  Medication Sig Dispense Refill  . apixaban (ELIQUIS) 5 MG TABS tablet Take 1 tablet (5 mg total) by mouth 2 (two) times daily. 180 tablet 2  . atorvastatin (LIPITOR) 40 MG tablet Take 1 tablet (40 mg total) by mouth daily. 30 tablet 2  . Calcium Carbonate  (CALCIUM 600 PO) Take 1 tablet by mouth in the morning and at bedtime.    . cholecalciferol (VITAMIN D3) 25 MCG (1000 UNIT) tablet Take 1,000 Units by mouth daily.    Marland Kitchen CINNAMON PO Take 2,000 mg by mouth daily.     . clobetasol cream (TEMOVATE) 0.05 % APPLY TO AFFECTED AREA TWICE DAILY FOR 2 WEEKS THEN DAILY FOR 2 WEEKS AND THEN 3 TIMES A WEEK    . fenofibrate 54 MG tablet Take 1 tablet (54 mg total) by mouth daily. 30 tablet 2  . Garlic (GARLIC 8841) 1500 MG CAPS Take 1 capsule by mouth daily.    . hydrochlorothiazide (MICROZIDE) 12.5 MG capsule Take 12.5 mg by mouth daily.    Marland Kitchen levothyroxine (SYNTHROID) 100 MCG tablet Taking one tablet by mouth daily and an extra 1/2 tablet on Sunday    . magnesium oxide (MAG-OX) 400 MG tablet Take 400 mg by mouth daily.    . metoprolol tartrate (LOPRESSOR) 50 MG tablet Take 1 tablet (50 mg total) by mouth 2 (two) times daily. 180 tablet 1  . Multiple Vitamin (MULTIVITAMIN ADULT PO) Take 1 tablet by mouth daily.    . pantoprazole (PROTONIX) 40 MG tablet Take 40 mg by mouth 2 (two) times daily before a meal.     . traMADol (ULTRAM) 50 MG tablet Take 50 mg by mouth 2 (two) times daily as needed  for moderate pain.     . vitamin B-12 (CYANOCOBALAMIN) 1000 MCG tablet Take 1,000 mcg by mouth daily.    . calcium carbonate (OS-CAL) 1250 (500 Ca) MG chewable tablet Chew 2 tablets by mouth daily.  (Patient not taking: Reported on 10/22/2019)     No current facility-administered medications for this encounter.    Allergies  Allergen Reactions  . Erythromycin     itching  . Escitalopram Other (See Comments)    insomnia  . Bupropion Itching and Other (See Comments)    insomnia Unknown Unknown insomnia     Social History   Socioeconomic History  . Marital status: Married    Spouse name: Not on file  . Number of children: Not on file  . Years of education: Not on file  . Highest education level: Not on file  Occupational History  . Not on file  Tobacco  Use  . Smoking status: Never Smoker  . Smokeless tobacco: Never Used  Substance and Sexual Activity  . Alcohol use: Never  . Drug use: Never  . Sexual activity: Not on file  Other Topics Concern  . Not on file  Social History Narrative  . Not on file   Social Determinants of Health   Financial Resource Strain:   . Difficulty of Paying Living Expenses: Not on file  Food Insecurity:   . Worried About Programme researcher, broadcasting/film/video in the Last Year: Not on file  . Ran Out of Food in the Last Year: Not on file  Transportation Needs:   . Lack of Transportation (Medical): Not on file  . Lack of Transportation (Non-Medical): Not on file  Physical Activity:   . Days of Exercise per Week: Not on file  . Minutes of Exercise per Session: Not on file  Stress:   . Feeling of Stress : Not on file  Social Connections:   . Frequency of Communication with Friends and Family: Not on file  . Frequency of Social Gatherings with Friends and Family: Not on file  . Attends Religious Services: Not on file  . Active Member of Clubs or Organizations: Not on file  . Attends Banker Meetings: Not on file  . Marital Status: Not on file  Intimate Partner Violence:   . Fear of Current or Ex-Partner: Not on file  . Emotionally Abused: Not on file  . Physically Abused: Not on file  . Sexually Abused: Not on file    Family History  Problem Relation Age of Onset  . Stroke Maternal Grandfather     ROS- All systems are reviewed and negative except as per the HPI above  Physical Exam: Vitals:   10/22/19 1047  BP: (!) 150/68  Pulse: 62  Weight: 65.4 kg  Height: 5\' 2"  (1.575 m)   Wt Readings from Last 3 Encounters:  10/22/19 65.4 kg  10/14/19 65.5 kg  10/01/19 65.4 kg    Labs: Lab Results  Component Value Date   NA 141 10/22/2019   K 4.1 10/22/2019   CL 100 10/22/2019   CO2 29 10/22/2019   GLUCOSE 104 (H) 10/22/2019   BUN 19 10/22/2019   CREATININE 1.34 (H) 10/22/2019   CALCIUM 9.9  10/22/2019   No results found for: INR Lab Results  Component Value Date   CHOL 159 09/09/2019   HDL 28 (L) 09/09/2019   LDLCALC 84 09/09/2019   TRIG 233 (H) 09/09/2019     GEN- The patient is well appearing, alert and oriented  x 3 today.   Head- normocephalic, atraumatic Eyes-  Sclera clear, conjunctiva pink Ears- hearing intact Oropharynx- clear Neck- supple, no JVP Lymph- no cervical lymphadenopathy Lungs- Clear to ausculation bilaterally, normal work of breathing Heart- Regular rate and rhythm, no murmurs, rubs or gallops, PMI not laterally displaced GI- soft, NT, ND, + BS Extremities- no clubbing, cyanosis, or edema MS- no significant deformity or atrophy Skin- no rash or lesion Psych- euthymic mood, full affect Neuro- strength and sensation are intact  EKG- NSR at 62 bpm, pr int 164 ms, qrs int 80 ms, qtc 422 ms  Assessment and Plan: 1. New onset  afib s/p recent CVA Episode noted yesterday, resolved with extra BB Will continue metoprolol 50 mg bid  Continue linq monitoring via device clinic   2. CHA2DS2VASc of 5 Plavix stopped  and started eliquis 5 mg bid 3-4 weeks ago  Noted small amount of vaginal bleeding x 1, if happens again, please make appointment with GYN to have evaluated    Will see back in 3 months   Lupita Leash C. [facility] [address] [place], [place] [address] [phone]

## 2019-11-20 LAB — CUP PACEART REMOTE DEVICE CHECK
Date Time Interrogation Session: 20211014214330
Implantable Pulse Generator Implant Date: 20210806

## 2019-11-22 ENCOUNTER — Ambulatory Visit (INDEPENDENT_AMBULATORY_CARE_PROVIDER_SITE_OTHER): Payer: Medicare HMO

## 2019-11-22 DIAGNOSIS — I639 Cerebral infarction, unspecified: Secondary | ICD-10-CM | POA: Diagnosis not present

## 2019-11-29 NOTE — Progress Notes (Signed)
Carelink Summary Report / Loop Recorder 

## 2019-12-22 ENCOUNTER — Ambulatory Visit (INDEPENDENT_AMBULATORY_CARE_PROVIDER_SITE_OTHER): Payer: Medicare HMO

## 2019-12-22 DIAGNOSIS — I639 Cerebral infarction, unspecified: Secondary | ICD-10-CM | POA: Diagnosis not present

## 2019-12-22 LAB — CUP PACEART REMOTE DEVICE CHECK
Date Time Interrogation Session: 20211116214638
Implantable Pulse Generator Implant Date: 20210806

## 2019-12-23 NOTE — Progress Notes (Signed)
Carelink Summary Report / Loop Recorder 

## 2020-01-24 ENCOUNTER — Ambulatory Visit (INDEPENDENT_AMBULATORY_CARE_PROVIDER_SITE_OTHER): Payer: Medicare HMO

## 2020-01-24 DIAGNOSIS — I639 Cerebral infarction, unspecified: Secondary | ICD-10-CM | POA: Diagnosis not present

## 2020-01-24 LAB — CUP PACEART REMOTE DEVICE CHECK
Date Time Interrogation Session: 20211219214126
Implantable Pulse Generator Implant Date: 20210806

## 2020-01-25 ENCOUNTER — Other Ambulatory Visit: Payer: Self-pay

## 2020-01-25 ENCOUNTER — Ambulatory Visit (HOSPITAL_COMMUNITY)
Admission: RE | Admit: 2020-01-25 | Discharge: 2020-01-25 | Disposition: A | Discharge status: Home / Self Care | Payer: Medicare HMO | Source: Ambulatory Visit | Attending: Nurse Practitioner | Admitting: Nurse Practitioner

## 2020-01-25 ENCOUNTER — Encounter (HOSPITAL_COMMUNITY): Payer: Self-pay | Admitting: Nurse Practitioner

## 2020-01-25 VITALS — BP 134/62 | HR 64 | Ht 62.0 in | Wt 146.2 lb

## 2020-01-25 DIAGNOSIS — Z7901 Long term (current) use of anticoagulants: Secondary | ICD-10-CM | POA: Insufficient documentation

## 2020-01-25 DIAGNOSIS — Z79899 Other long term (current) drug therapy: Secondary | ICD-10-CM | POA: Insufficient documentation

## 2020-01-25 DIAGNOSIS — I639 Cerebral infarction, unspecified: Secondary | ICD-10-CM | POA: Diagnosis not present

## 2020-01-25 DIAGNOSIS — Z881 Allergy status to other antibiotic agents status: Secondary | ICD-10-CM | POA: Diagnosis not present

## 2020-01-25 DIAGNOSIS — I4891 Unspecified atrial fibrillation: Secondary | ICD-10-CM

## 2020-01-25 DIAGNOSIS — Z888 Allergy status to other drugs, medicaments and biological substances status: Secondary | ICD-10-CM | POA: Diagnosis not present

## 2020-01-25 DIAGNOSIS — I480 Paroxysmal atrial fibrillation: Secondary | ICD-10-CM

## 2020-01-25 DIAGNOSIS — Z8249 Family history of ischemic heart disease and other diseases of the circulatory system: Secondary | ICD-10-CM | POA: Diagnosis not present

## 2020-01-25 NOTE — Progress Notes (Addendum)
Primary Care Physician: Brooke Bonito, MD Referring Physician: Dr. [facility]   Karina [name] is a 79 y.o. 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 A  Linq  was implanted by Dr. Elberta Fortis at the time and recently showed 1 hour 46 mins of afib. She states that she did feel a flutter in her chest around that time. She is on metoprolol 25 mg bid . She is in the afib clinic to discuss change of anticoagulation.  She was discharged on plavix and asa. She took asa x 3 weeks and is now off. EKG shows SR today.   F/u in afib clinic, 9/17. She has been on anticoagulation  for 3-4 weeks. Noted small amount of bloody vaginal d/c x one. No other signs of bleeding. She was noted per her Linq  that she had a bout of afib with RVR yesterday.  BB was increased and today she is back in SR.   F/u in afib clinic, 01/25/20. Pt has not noted any afib in the last month. Paceart reviewed. No complaints, no bleeding issues. Being compliant with eliqius. She and  her husband were recently vaccinated for covid in November.   Today, she denies symptoms of palpitations, chest pain, shortness of breath, orthopnea, PND, lower extremity edema, dizziness, presyncope, syncope, or neurologic sequela. The patient is tolerating medications without difficulties and is otherwise without complaint today.   Past Medical History:  Diagnosis Date  . GERD (gastroesophageal reflux disease)   . Hypertension   . Thyroid disease    Past Surgical History:  Procedure Laterality Date  . ABDOMINAL HYSTERECTOMY    . APPENDECTOMY    . CHOLECYSTECTOMY    . EYE SURGERY    . LOOP RECORDER INSERTION N/A 09/10/2019   Procedure: LOOP RECORDER INSERTION;  Surgeon: Regan Lemming, MD;  Location: [facility];  Service: Cardiovascular;  Laterality: N/A;    Current Outpatient Medications  Medication Sig Dispense Refill  .  apixaban (ELIQUIS) 5 MG TABS tablet Take 1 tablet (5 mg total) by mouth 2 (two) times daily. 180 tablet 2  . atorvastatin (LIPITOR) 40 MG tablet Take 1 tablet (40 mg total) by mouth daily. 30 tablet 2  . Calcium Carbonate (CALCIUM 600 PO) Take 1 tablet by mouth in the morning and at bedtime.    . chlorpheniramine (EQ CHLORTABS) 4 MG tablet Take 4 mg by mouth 2 (two) times daily as needed for allergies.    . cholecalciferol (VITAMIN D3) 25 MCG (1000 UNIT) tablet Take 1,000 Units by mouth daily.    Marland Kitchen CINNAMON PO Take 2,000 mg by mouth daily.     . clobetasol cream (TEMOVATE) 0.05 % APPLY TO AFFECTED AREA TWICE DAILY FOR 2 WEEKS THEN DAILY FOR 2 WEEKS AND THEN 3 TIMES A WEEK    . fenofibrate 54 MG tablet Take 1 tablet (54 mg total) by mouth daily. 30 tablet 2  . Garlic 1500 MG CAPS Take 1 capsule by mouth daily.    . hydrochlorothiazide (MICROZIDE) 12.5 MG capsule Take 12.5 mg by mouth daily.    Marland Kitchen levothyroxine (SYNTHROID) 100 MCG tablet Taking one tablet by mouth daily and an extra 1/2 tablet on Sunday    . magnesium oxide (MAG-OX) 400 MG tablet Take 400 mg by mouth daily.    . metoprolol tartrate (LOPRESSOR) 50 MG tablet Take 1 tablet (50 mg total) by mouth 2 (  two) times daily. 180 tablet 1  . Multiple Vitamin (MULTIVITAMIN ADULT PO) Take 1 tablet by mouth daily.    . pantoprazole (PROTONIX) 40 MG tablet Take 40 mg by mouth 2 (two) times daily before a meal.     . traMADol (ULTRAM) 50 MG tablet Take 50 mg by mouth 2 (two) times daily as needed for moderate pain.     . vitamin B-12 (CYANOCOBALAMIN) 1000 MCG tablet Take 1,000 mcg by mouth daily.     No current facility-administered medications for this encounter.    Allergies  Allergen Reactions  . Erythromycin     itching  . Escitalopram Other (See Comments)    insomnia  . Bupropion Itching and Other (See Comments)    insomnia Unknown Unknown insomnia     Social History   Socioeconomic History  . Marital status: Married    Spouse  name: Not on file  . Number of children: Not on file  . Years of education: Not on file  . Highest education level: Not on file  Occupational History  . Not on file  Tobacco Use  . Smoking status: Never Smoker  . Smokeless tobacco: Never Used  Substance and Sexual Activity  . Alcohol use: Never  . Drug use: Never  . Sexual activity: Not on file  Other Topics Concern  . Not on file  Social History Narrative  . Not on file   Social Determinants of Health   Financial Resource Strain: Not on file  Food Insecurity: Not on file  Transportation Needs: Not on file  Physical Activity: Not on file  Stress: Not on file  Social Connections: Not on file  Intimate Partner Violence: Not on file    Family History  Problem Relation Age of Onset  . Stroke Maternal Grandfather     ROS- All systems are reviewed and negative except as per the HPI above  Physical Exam: Vitals:   01/25/20 0937  BP: 134/62  Pulse: 64  Weight: 66.3 kg  Height: 5\' 2"  (1.575 m)   Wt Readings from Last 3 Encounters:  01/25/20 66.3 kg  10/22/19 65.4 kg  10/14/19 65.5 kg    Labs: Lab Results  Component Value Date   NA 141 10/22/2019   K 4.1 10/22/2019   CL 100 10/22/2019   CO2 29 10/22/2019   GLUCOSE 104 (H) 10/22/2019   BUN 19 10/22/2019   CREATININE 1.34 (H) 10/22/2019   CALCIUM 9.9 10/22/2019   No results found for: INR Lab Results  Component Value Date   CHOL 159 09/09/2019   HDL 28 (L) 09/09/2019   LDLCALC 84 09/09/2019   TRIG 233 (H) 09/09/2019     GEN- The patient is well appearing, alert and oriented x 3 today.   Head- normocephalic, atraumatic Eyes-  Sclera clear, conjunctiva pink Ears- hearing intact Oropharynx- clear Neck- supple, no JVP Lymph- no cervical lymphadenopathy Lungs- Clear to ausculation bilaterally, normal work of breathing Heart- Regular rate and rhythm, no murmurs, rubs or gallops, PMI not laterally displaced GI- soft, NT, ND, + BS Extremities- no  clubbing, cyanosis, or edema MS- no significant deformity or atrophy Skin- no rash or lesion Psych- euthymic mood, full affect Neuro- strength and sensation are intact  EKG- NSR at 64 bpm, pr int 158 ms, qrs int 78 ms, qtc 437 ms   Assessment and Plan: 1. New onset  Summer of 2021 afib,  s/p recent CVA Episode resolved with extra BB Will continue metoprolol 50 mg bid  Continue linq monitoring via device clinic  Has had low burden with Paceart reports reviewed   2. CHA2DS2VASc of 5 Continue  eliquis 5 mg bid   No bleeding noted    F/u in afib clinic as needed   Lupita Leash [facility] [address] [place], [place] [address] [phone]

## 2020-02-03 NOTE — Progress Notes (Signed)
Carelink Summary Report / Loop Recorder 

## 2020-02-14 ENCOUNTER — Ambulatory Visit: Payer: Medicare HMO | Admitting: Adult Health

## 2020-02-16 NOTE — Addendum Note (Signed)
Encounter addended by: Newman Nip, NP on: 02/16/2020 12:38 PM  Actions taken: Clinical Note Signed

## 2020-02-16 NOTE — Addendum Note (Signed)
Encounter addended by: Newman Nip, NP on: 02/16/2020 12:37 PM  Actions taken: Clinical Note Signed

## 2020-02-16 NOTE — Addendum Note (Signed)
Encounter addended by: Newman Nip, NP on: 02/16/2020 12:36 PM  Actions taken: Clinical Note Signed

## 2020-02-24 ENCOUNTER — Ambulatory Visit (INDEPENDENT_AMBULATORY_CARE_PROVIDER_SITE_OTHER): Payer: Medicare HMO

## 2020-02-24 DIAGNOSIS — I639 Cerebral infarction, unspecified: Secondary | ICD-10-CM | POA: Diagnosis not present

## 2020-02-26 LAB — CUP PACEART REMOTE DEVICE CHECK
Date Time Interrogation Session: 20220121214104
Implantable Pulse Generator Implant Date: 20210806

## 2020-03-07 NOTE — Progress Notes (Signed)
Carelink Summary Report / Loop Recorder 

## 2020-03-21 ENCOUNTER — Other Ambulatory Visit (HOSPITAL_COMMUNITY): Payer: Self-pay | Admitting: Nurse Practitioner

## 2020-03-27 ENCOUNTER — Ambulatory Visit (INDEPENDENT_AMBULATORY_CARE_PROVIDER_SITE_OTHER): Payer: Medicare HMO

## 2020-03-27 DIAGNOSIS — I639 Cerebral infarction, unspecified: Secondary | ICD-10-CM

## 2020-03-30 LAB — CUP PACEART REMOTE DEVICE CHECK
Date Time Interrogation Session: 20220223214551
Implantable Pulse Generator Implant Date: 20210806

## 2020-03-30 NOTE — Progress Notes (Signed)
Carelink Summary Report / Loop Recorder 

## 2020-04-27 ENCOUNTER — Ambulatory Visit (INDEPENDENT_AMBULATORY_CARE_PROVIDER_SITE_OTHER): Payer: Medicare HMO

## 2020-04-27 DIAGNOSIS — I639 Cerebral infarction, unspecified: Secondary | ICD-10-CM | POA: Diagnosis not present

## 2020-05-02 LAB — CUP PACEART REMOTE DEVICE CHECK
Date Time Interrogation Session: 20220328214408
Implantable Pulse Generator Implant Date: 20210806

## 2020-05-08 ENCOUNTER — Emergency Department (HOSPITAL_BASED_OUTPATIENT_CLINIC_OR_DEPARTMENT_OTHER): Payer: Medicare HMO

## 2020-05-08 ENCOUNTER — Other Ambulatory Visit: Payer: Self-pay

## 2020-05-08 ENCOUNTER — Encounter (HOSPITAL_BASED_OUTPATIENT_CLINIC_OR_DEPARTMENT_OTHER): Payer: Self-pay | Admitting: *Deleted

## 2020-05-08 DIAGNOSIS — I10 Essential (primary) hypertension: Secondary | ICD-10-CM | POA: Diagnosis not present

## 2020-05-08 DIAGNOSIS — R0789 Other chest pain: Secondary | ICD-10-CM | POA: Diagnosis not present

## 2020-05-08 DIAGNOSIS — E785 Hyperlipidemia, unspecified: Secondary | ICD-10-CM | POA: Diagnosis not present

## 2020-05-08 DIAGNOSIS — Z7901 Long term (current) use of anticoagulants: Secondary | ICD-10-CM | POA: Insufficient documentation

## 2020-05-08 DIAGNOSIS — Z79899 Other long term (current) drug therapy: Secondary | ICD-10-CM | POA: Diagnosis not present

## 2020-05-08 DIAGNOSIS — R1013 Epigastric pain: Secondary | ICD-10-CM | POA: Diagnosis present

## 2020-05-08 DIAGNOSIS — Z20822 Contact with and (suspected) exposure to covid-19: Secondary | ICD-10-CM | POA: Insufficient documentation

## 2020-05-08 DIAGNOSIS — E039 Hypothyroidism, unspecified: Secondary | ICD-10-CM | POA: Insufficient documentation

## 2020-05-08 DIAGNOSIS — I480 Paroxysmal atrial fibrillation: Secondary | ICD-10-CM | POA: Diagnosis not present

## 2020-05-08 LAB — CBC
HCT: 35.5 % — ABNORMAL LOW (ref 36.0–46.0)
Hemoglobin: 12 g/dL (ref 12.0–15.0)
MCH: 29.1 pg (ref 26.0–34.0)
MCHC: 33.8 g/dL (ref 30.0–36.0)
MCV: 86 fL (ref 80.0–100.0)
Platelets: 478 10*3/uL — ABNORMAL HIGH (ref 150–400)
RBC: 4.13 MIL/uL (ref 3.87–5.11)
RDW: 13.7 % (ref 11.5–15.5)
WBC: 12 10*3/uL — ABNORMAL HIGH (ref 4.0–10.5)
nRBC: 0 % (ref 0.0–0.2)

## 2020-05-08 LAB — BASIC METABOLIC PANEL
Anion gap: 9 (ref 5–15)
BUN: 26 mg/dL — ABNORMAL HIGH (ref 8–23)
CO2: 27 mmol/L (ref 22–32)
Calcium: 9.2 mg/dL (ref 8.9–10.3)
Chloride: 101 mmol/L (ref 98–111)
Creatinine, Ser: 1.64 mg/dL — ABNORMAL HIGH (ref 0.44–1.00)
GFR, Estimated: 32 mL/min — ABNORMAL LOW (ref >60)
Glucose, Bld: 149 mg/dL — ABNORMAL HIGH (ref 70–99)
Potassium: 4.1 mmol/L (ref 3.5–5.1)
Sodium: 137 mmol/L (ref 135–145)

## 2020-05-08 LAB — TROPONIN I (HIGH SENSITIVITY): Troponin I (High Sensitivity): 5 ng/L (ref <18)

## 2020-05-08 IMAGING — DX DG CHEST 2V
2 series · 2 of 2 positions shown · non-contrast
Comparison: [DATE]

CLINICAL DATA: Chest pain

EXAM:
CHEST - 2 VIEW

[chest pa]
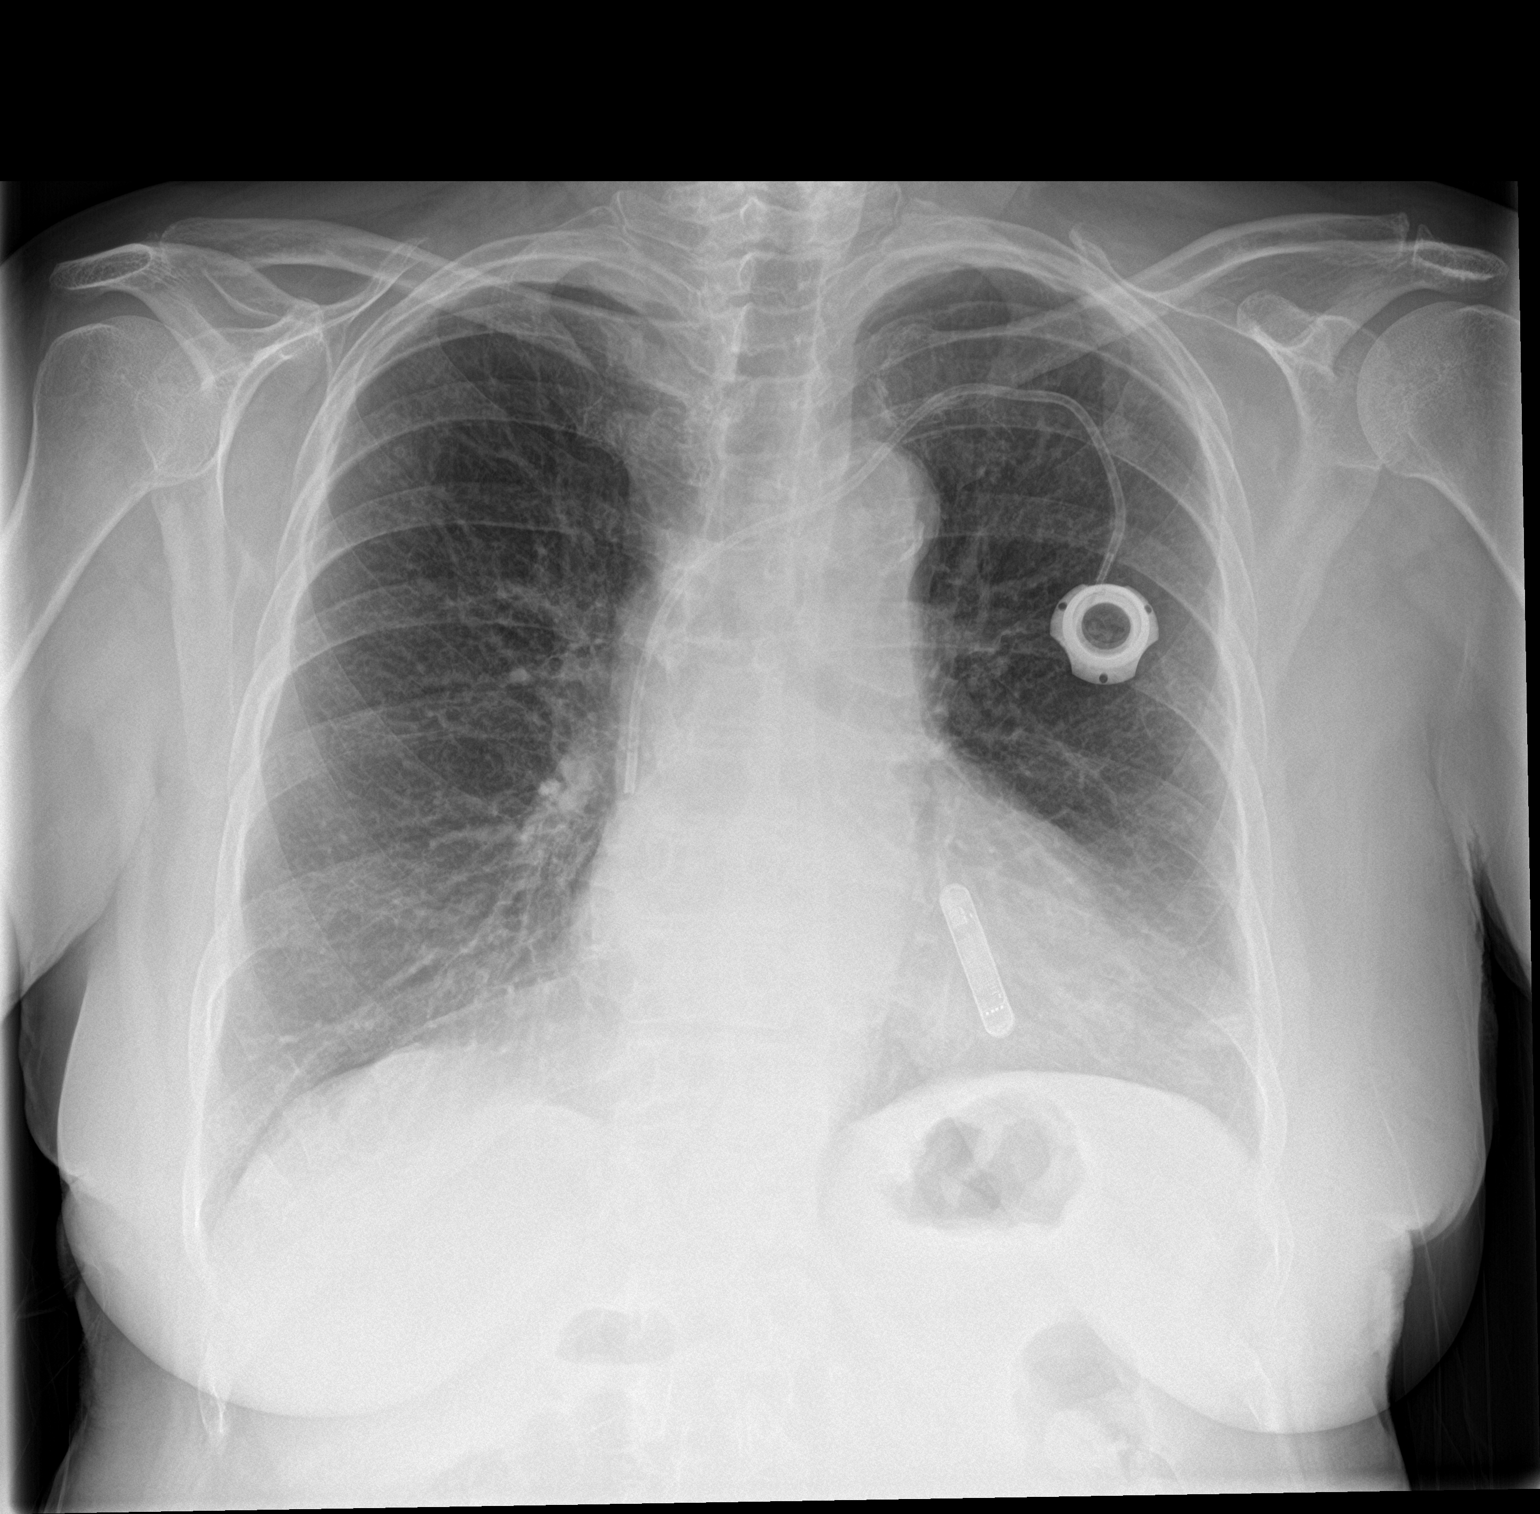

[chest lat]
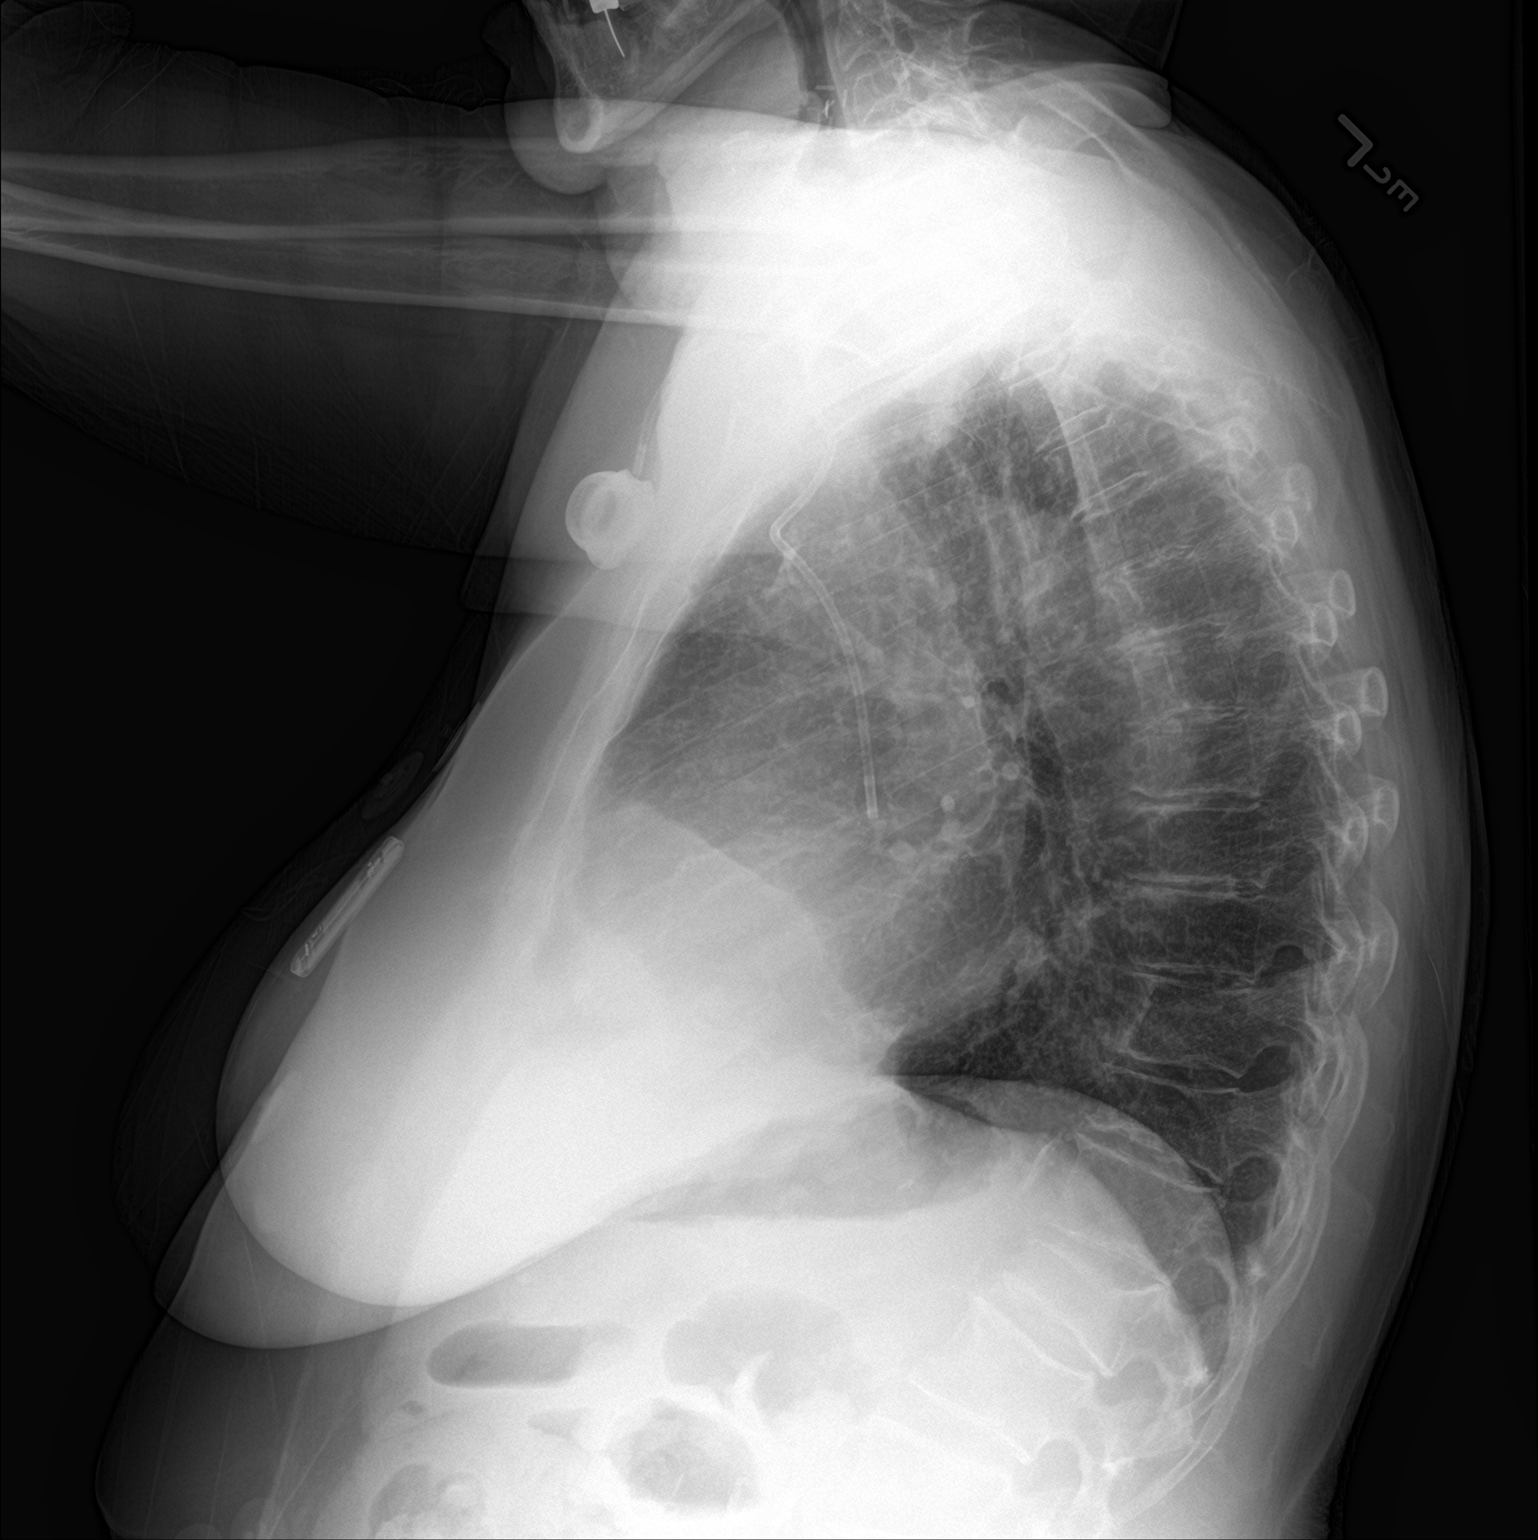

[2 of 2 positions shown; findings below may reference images not displayed]

FINDINGS: Left Port-A-Cath remains in place, unchanged. Loop recorder projects
over the left chest. Heart is normal size. No confluent airspace
opacities or effusions.
IMPRESSION: No active cardiopulmonary disease.

## 2020-05-08 NOTE — ED Notes (Signed)
Dr Madilyn Hook

## 2020-05-08 NOTE — ED Triage Notes (Signed)
Emergency Medicine Provider Triage Evaluation Note  Karina [name] , a 80 y.o. female  was evaluated in triage.  Pt complains of substernal chest pain that radiates to left x1 day. Pressure-like sensation. No previous MI. No tobacco use. Denies associated diaphoresis, nausea, vomiting, shortness of breath.  Chest pain intermittent.  Unsure if worse with exertion.   Review of Systems  Positive: Chest pain Negative: Shortness of breath  Physical Exam  BP (!) 186/73 (BP Location: Right Arm)   Pulse 73   Temp 97.7 F (36.5 C) (Oral)   Resp 20   Ht 5\' 2"  (1.575 m)   Wt 68.9 kg   SpO2 95%   BMI 27.78 kg/m  Gen:   Awake, no distress   HEENT:  Atraumatic Resp:  Normal effort  Cardiac:  Normal rate  Abd:   Nondistended, nontender  MSK:   Moves extremities without difficulty Neuro:  Speech clear  Medical Decision Making  Medically screening exam initiated at 9:24 PM.  Appropriate orders placed.  [name] was informed that the remainder of the evaluation will be completed by another provider, this initial triage assessment does not replace that evaluation, and the importance of remaining in the ED until their evaluation is complete.  Clinical Impression  Labs, EKG, CXR ordered   Karina [name] 05/08/20 2211

## 2020-05-08 NOTE — ED Triage Notes (Signed)
Chest pain since yesterday. Pain comes and goes. She has been taking Tylenol for the pain.

## 2020-05-08 NOTE — Progress Notes (Signed)
Carelink Summary Report / Loop Recorder 

## 2020-05-09 ENCOUNTER — Observation Stay (HOSPITAL_BASED_OUTPATIENT_CLINIC_OR_DEPARTMENT_OTHER)
Admission: EM | Admit: 2020-05-09 | Discharge: 2020-05-10 | Disposition: A | Discharge status: Home / Self Care | Payer: Medicare HMO | Attending: Internal Medicine | Admitting: Internal Medicine

## 2020-05-09 ENCOUNTER — Emergency Department (HOSPITAL_BASED_OUTPATIENT_CLINIC_OR_DEPARTMENT_OTHER): Payer: Medicare HMO

## 2020-05-09 ENCOUNTER — Encounter (HOSPITAL_COMMUNITY): Payer: Self-pay | Admitting: Internal Medicine

## 2020-05-09 DIAGNOSIS — Z8673 Personal history of transient ischemic attack (TIA), and cerebral infarction without residual deficits: Secondary | ICD-10-CM

## 2020-05-09 DIAGNOSIS — I480 Paroxysmal atrial fibrillation: Secondary | ICD-10-CM

## 2020-05-09 DIAGNOSIS — I10 Essential (primary) hypertension: Secondary | ICD-10-CM

## 2020-05-09 DIAGNOSIS — R079 Chest pain, unspecified: Secondary | ICD-10-CM | POA: Diagnosis not present

## 2020-05-09 DIAGNOSIS — Z7901 Long term (current) use of anticoagulants: Secondary | ICD-10-CM | POA: Diagnosis not present

## 2020-05-09 DIAGNOSIS — I482 Chronic atrial fibrillation, unspecified: Secondary | ICD-10-CM

## 2020-05-09 DIAGNOSIS — R1013 Epigastric pain: Secondary | ICD-10-CM

## 2020-05-09 DIAGNOSIS — E039 Hypothyroidism, unspecified: Secondary | ICD-10-CM

## 2020-05-09 DIAGNOSIS — E785 Hyperlipidemia, unspecified: Secondary | ICD-10-CM | POA: Diagnosis not present

## 2020-05-09 DIAGNOSIS — Z79899 Other long term (current) drug therapy: Secondary | ICD-10-CM | POA: Diagnosis not present

## 2020-05-09 DIAGNOSIS — Z20822 Contact with and (suspected) exposure to covid-19: Secondary | ICD-10-CM | POA: Diagnosis not present

## 2020-05-09 DIAGNOSIS — R0789 Other chest pain: Secondary | ICD-10-CM | POA: Diagnosis not present

## 2020-05-09 LAB — CBC WITH DIFFERENTIAL/PLATELET
Abs Immature Granulocytes: 0.05 10*3/uL (ref 0.00–0.07)
Basophils Absolute: 0.1 10*3/uL (ref 0.0–0.1)
Basophils Relative: 1 %
Eosinophils Absolute: 0.5 10*3/uL (ref 0.0–0.5)
Eosinophils Relative: 4 %
HCT: 34.1 % — ABNORMAL LOW (ref 36.0–46.0)
Hemoglobin: 11.2 g/dL — ABNORMAL LOW (ref 12.0–15.0)
Immature Granulocytes: 0 %
Lymphocytes Relative: 22 %
Lymphs Abs: 2.6 10*3/uL (ref 0.7–4.0)
MCH: 28.7 pg (ref 26.0–34.0)
MCHC: 32.8 g/dL (ref 30.0–36.0)
MCV: 87.4 fL (ref 80.0–100.0)
Monocytes Absolute: 1.1 10*3/uL — ABNORMAL HIGH (ref 0.1–1.0)
Monocytes Relative: 10 %
Neutro Abs: 7.4 10*3/uL (ref 1.7–7.7)
Neutrophils Relative %: 63 %
Platelets: 400 10*3/uL (ref 150–400)
RBC: 3.9 MIL/uL (ref 3.87–5.11)
RDW: 13.9 % (ref 11.5–15.5)
WBC: 11.8 10*3/uL — ABNORMAL HIGH (ref 4.0–10.5)
nRBC: 0 % (ref 0.0–0.2)

## 2020-05-09 LAB — COMPREHENSIVE METABOLIC PANEL
ALT: 18 U/L (ref 0–44)
AST: 32 U/L (ref 15–41)
Albumin: 3.7 g/dL (ref 3.5–5.0)
Alkaline Phosphatase: 61 U/L (ref 38–126)
Anion gap: 8 (ref 5–15)
BUN: 21 mg/dL (ref 8–23)
CO2: 29 mmol/L (ref 22–32)
Calcium: 9.1 mg/dL (ref 8.9–10.3)
Chloride: 102 mmol/L (ref 98–111)
Creatinine, Ser: 1.41 mg/dL — ABNORMAL HIGH (ref 0.44–1.00)
GFR, Estimated: 38 mL/min — ABNORMAL LOW (ref >60)
Glucose, Bld: 98 mg/dL (ref 70–99)
Potassium: 3.9 mmol/L (ref 3.5–5.1)
Sodium: 139 mmol/L (ref 135–145)
Total Bilirubin: 0.7 mg/dL (ref 0.3–1.2)
Total Protein: 6.8 g/dL (ref 6.5–8.1)

## 2020-05-09 LAB — HEPATIC FUNCTION PANEL
ALT: 19 U/L (ref 0–44)
AST: 40 U/L (ref 15–41)
Albumin: 3.9 g/dL (ref 3.5–5.0)
Alkaline Phosphatase: 61 U/L (ref 38–126)
Bilirubin, Direct: 0.1 mg/dL (ref 0.0–0.2)
Indirect Bilirubin: 0.2 mg/dL — ABNORMAL LOW (ref 0.3–0.9)
Total Bilirubin: 0.3 mg/dL (ref 0.3–1.2)
Total Protein: 7.5 g/dL (ref 6.5–8.1)

## 2020-05-09 LAB — TROPONIN I (HIGH SENSITIVITY): Troponin I (High Sensitivity): 5 ng/L (ref <18)

## 2020-05-09 LAB — TSH: TSH: 0.744 u[IU]/mL (ref 0.350–4.500)

## 2020-05-09 LAB — LIPASE, BLOOD
Lipase: 35 U/L (ref 11–51)
Lipase: 25 U/L (ref 11–51)

## 2020-05-09 LAB — RESP PANEL BY RT-PCR (FLU A&B, COVID) ARPGX2
Influenza A by PCR: NEGATIVE
Influenza B by PCR: NEGATIVE
SARS Coronavirus 2 by RT PCR: NEGATIVE

## 2020-05-09 LAB — D-DIMER, QUANTITATIVE: D-Dimer, Quant: <0.27 ug/mL-FEU (ref 0.00–0.50)

## 2020-05-09 LAB — LACTIC ACID, PLASMA: Lactic Acid, Venous: 1.3 mmol/L (ref 0.5–1.9)

## 2020-05-09 IMAGING — CT CT ABD-PELV W/O CM
2 of 4 series · 15 of 46 positions shown, 17 images · non-contrast
Comparison: Report of [facility] [REDACTED] restaging CT Chest, Abdomen, and Pelvis [DATE] (no images
available).

[REDACTED] CT Abdomen and Pelvis [DATE].

CLINICAL DATA: 79-year-old female with abdominal pain. History of
ovarian cancer.

EXAM:
CT ABDOMEN AND PELVIS WITHOUT CONTRAST
TECHNIQUE: Multidetector CT imaging of the abdomen and pelvis was performed
following the standard protocol without IV contrast.

[Series 2: axial st · axial · 0.95mm/px · z∈[+797,+1167]mm · 12 of 82 slices shown, 14 images]
[im 4/82  soft-tissue]
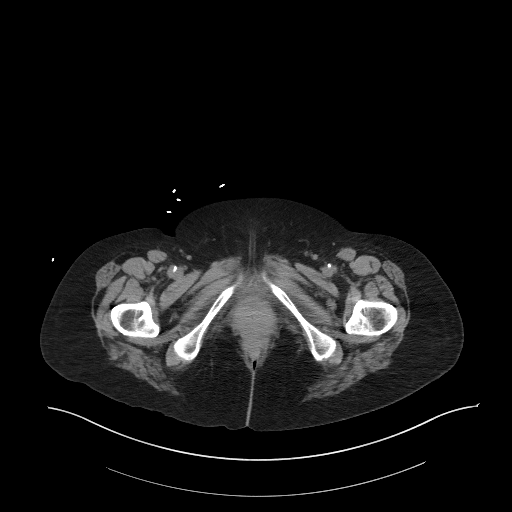
[im 4/82  bone]
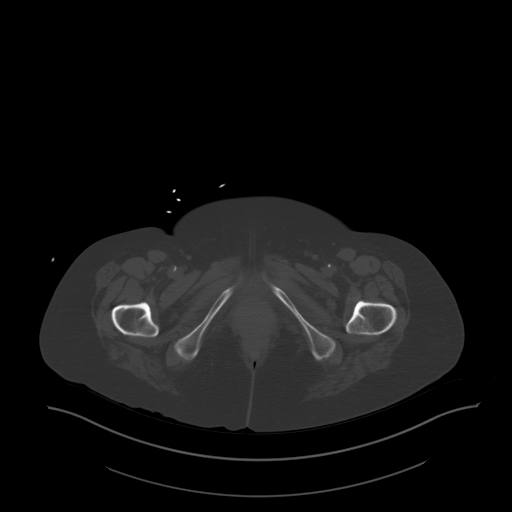
[im 11/82  soft-tissue]
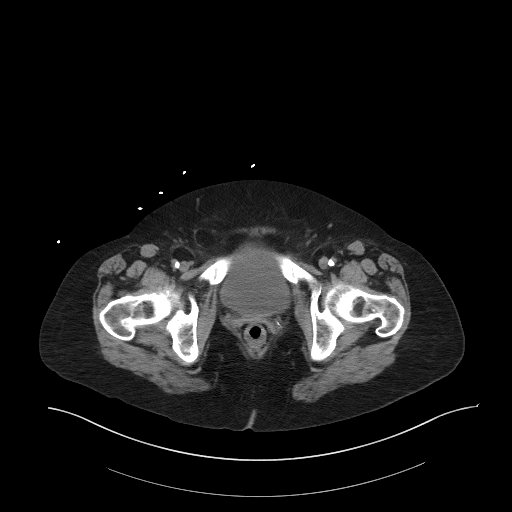
[im 17/82  soft-tissue]
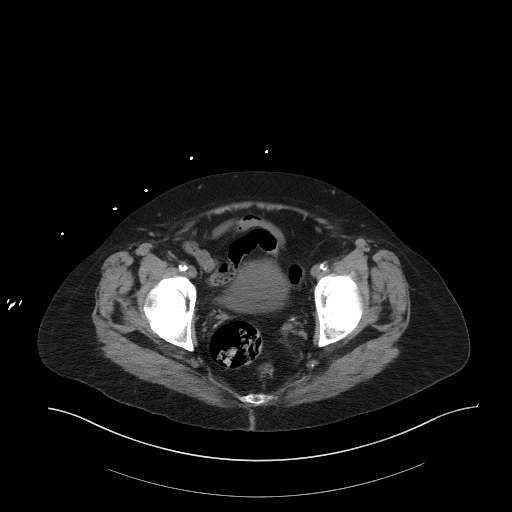
[im 24/82  soft-tissue]
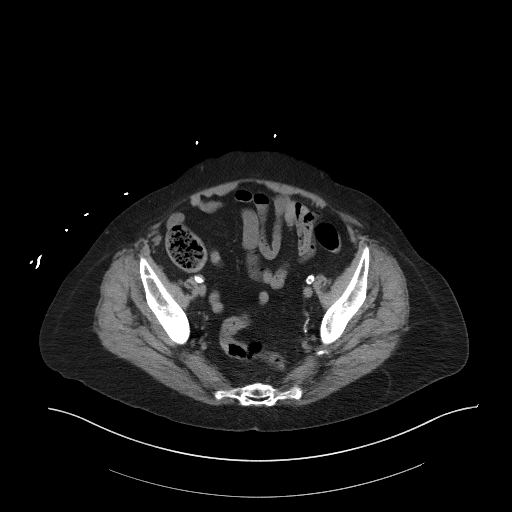
[im 31/82  soft-tissue]
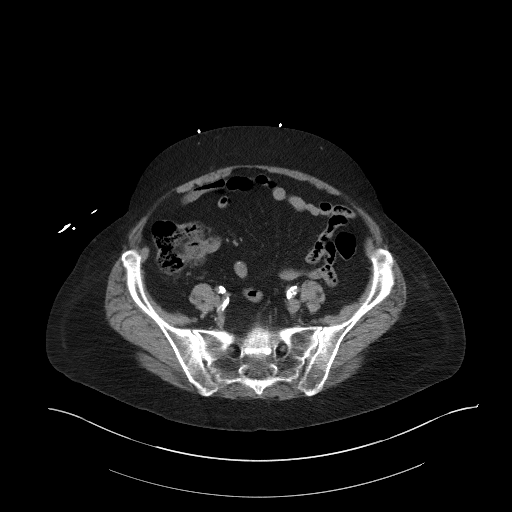
[im 38/82  soft-tissue]
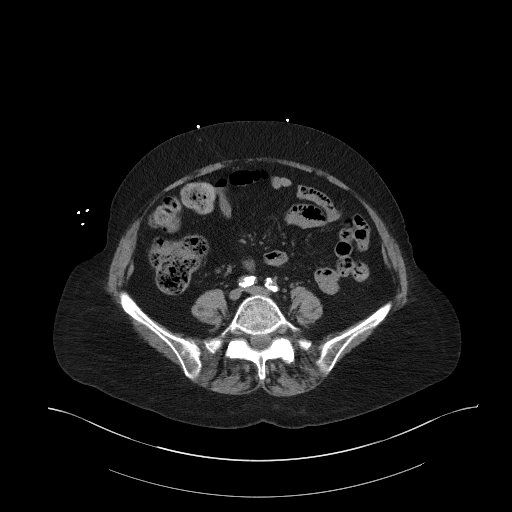
[im 44/82  soft-tissue]
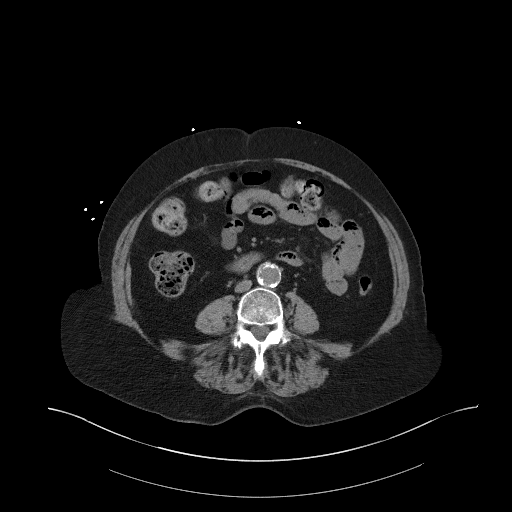
[im 51/82  soft-tissue]
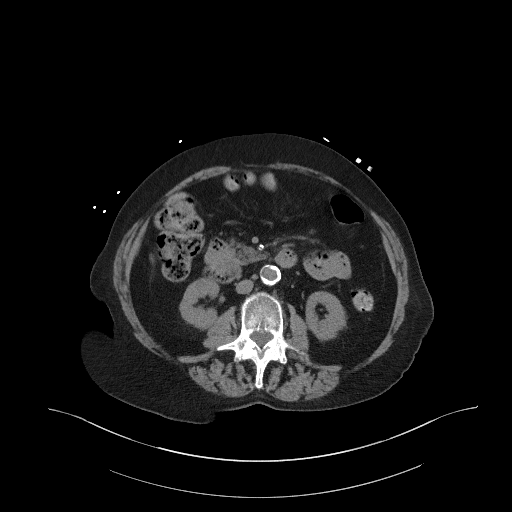
[im 58/82  soft-tissue]
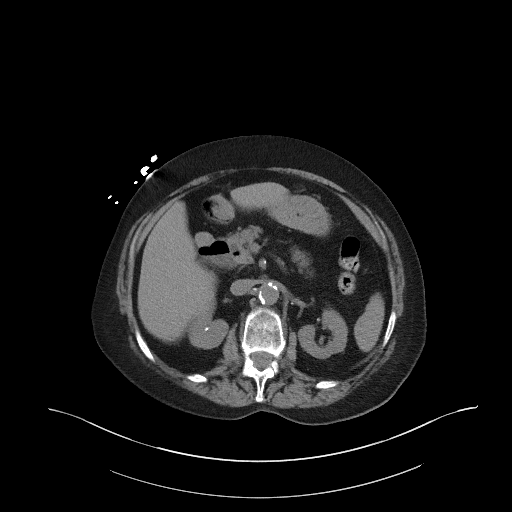
[im 58/82  bone]
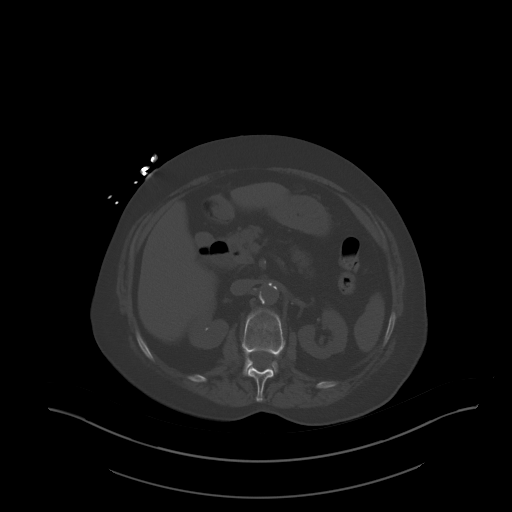
[im 65/82  soft-tissue]
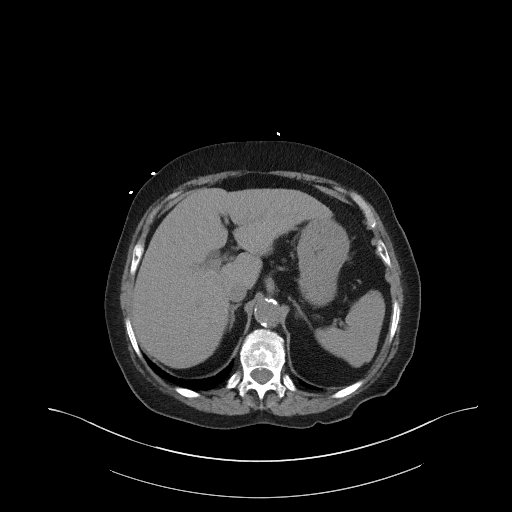
[im 71/82  soft-tissue]
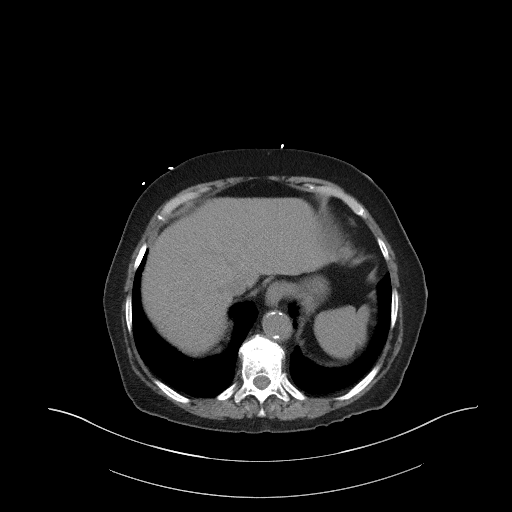
[im 78/82  soft-tissue]
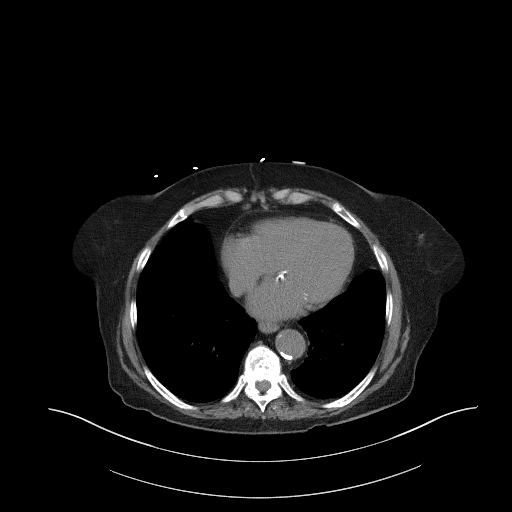

[Series 5: coronal st · coronal · 0.67mm/px · 3 of 90 slices shown]
[im 30/90  soft-tissue]
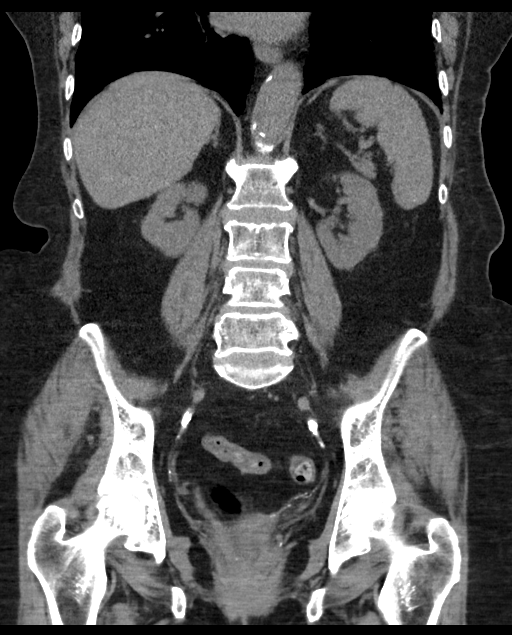
[im 40/90  soft-tissue]
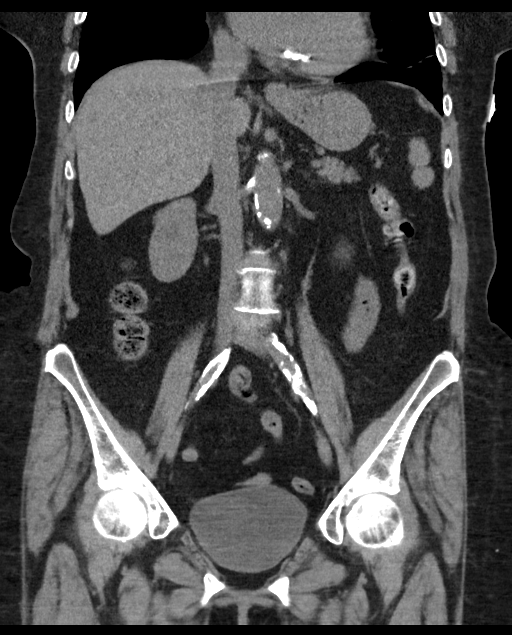
[im 50/90  soft-tissue]
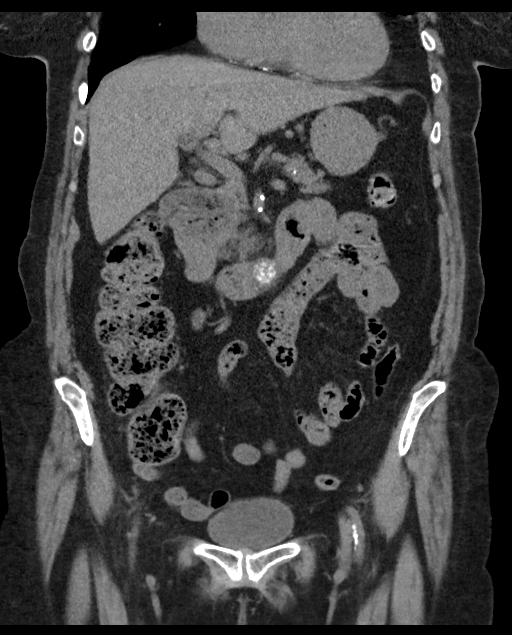

[15 of 46 positions shown; findings below may reference images not displayed]

FINDINGS: Lower chest: Stable cardiac size at the upper limits of normal. No
pericardial effusion. No superior diaphragmatic or cardiophrenic
angle lymphadenopathy now. No pericardial or pleural effusion.
Negative lung bases aside from mild atelectasis or scarring.

Hepatobiliary: Chronically absent gallbladder. Negative noncontrast
liver.

Pancreas: Negative.

Spleen: Negative.

Adrenals/Urinary Tract: Normal adrenal glands. Bilateral
nephrolithiasis ranging from punctate to 9 mm in the right kidney.
No hydronephrosis. No perinephric stranding. Chronic right renal
upper pole cortical scarring is new or increased since [SW].
Otherwise stable noncontrast appearance of both kidneys. Ureters are
decompressed and negative. Negative bladder.

Stomach/Bowel: Redundant large bowel, although mostly decompressed
from the splenic flexure distally. Mild retained stool in the
rectum. Mild retained stool in the transverse and right colon.
Diminutive or absent appendix. No large bowel inflammation. Negative
terminal ileum.

No dilated small bowel. Decompressed stomach. There is a chronic
large duodenal diverticulum with flocculated internal contents
resembling a large bowel loop. This measures about 6 mm diameter
(series 2, image 28) and was 5 cm in [SW]. No regional inflammation.
The remaining duodenum is decompressed.

No free air, free fluid, mesenteric stranding.

Vascular/Lymphatic: Severe Aortoiliac calcified atherosclerosis.
Tortuous abdominal aorta, with infrarenal segment measuring up to 28
mm diameter, increased from 26 mm in [SW]. Recommend follow-up
ultrasound every 5 years. This recommendation follows ACR consensus
guidelines: White Paper of the ACR Incidental Findings Committee II
on Vascular Findings. [HOSPITAL] [SW]; [DATE].

Vascular patency is not evaluated in the absence of IV contrast. No
lymphadenopathy.

Reproductive: Surgically absent. No pelvic sidewall lymphadenopathy.

Other: No pelvic free fluid.

Musculoskeletal: Compression fractures of T11 and L1 are new since
[SW] but age indeterminate. Minimal retropulsion. Associated chronic
severe T11-T12 disc degeneration with partially calcified disc
extrusion. No suspicious osseous lesion identified.
IMPRESSION: 1. Age indeterminate compression fractures of T11 and L1, new since
[SW]. No complicating features.
If specific therapy such as vertebroplasty is desired, Lumbar MRI or
Nuclear Medicine Whole-body Bone Scan would best determine acuity.

2. Otherwise no acute or metastatic process identified in the
abdomen or pelvis. Resolved superior diaphragmatic lymphadenopathy
since [DATE]. Chronic large duodenal diverticulum (6 cm) with flocculated
internal contents resembling a loop of large bowel. This has
enlarged from 5 cm in [SW], but there is no regional inflammation.

4. Bilateral nephrolithiasis. No obstructive uropathy.

5. Small infrarenal abdominal aortic aneurysm, increased by 2 mm
since [SW]. Recommend follow-up ultrasound every 5 years. Aortic
Atherosclerosis ([SW]-[SW]).

## 2020-05-09 MED ORDER — METOPROLOL TARTRATE 50 MG PO TABS
50.0000 mg | ORAL_TABLET | Freq: Two times a day (BID) | ORAL | Status: DC
  Administered 2020-05-09 – 2020-05-10 (×3): 50 mg via ORAL
  Filled 2020-05-09 (×2): qty 1
  Filled 2020-05-09: qty 2

## 2020-05-09 MED ORDER — APIXABAN 5 MG PO TABS
5.0000 mg | ORAL_TABLET | Freq: Two times a day (BID) | ORAL | Status: DC
  Administered 2020-05-09 – 2020-05-10 (×3): 5 mg via ORAL
  Filled 2020-05-09: qty 2
  Filled 2020-05-09 (×2): qty 1

## 2020-05-09 MED ORDER — MAGNESIUM OXIDE 400 (241.3 MG) MG PO TABS
400.0000 mg | ORAL_TABLET | Freq: Every day | ORAL | Status: DC
  Administered 2020-05-09 – 2020-05-10 (×2): 400 mg via ORAL
  Filled 2020-05-09 (×2): qty 1

## 2020-05-09 MED ORDER — FENTANYL CITRATE (PF) 100 MCG/2ML IJ SOLN
50.0000 ug | Freq: Once | INTRAMUSCULAR | Status: AC
  Administered 2020-05-09: 50 ug via INTRAVENOUS
  Filled 2020-05-09: qty 2

## 2020-05-09 MED ORDER — ACETAMINOPHEN 325 MG PO TABS
650.0000 mg | ORAL_TABLET | ORAL | Status: DC | PRN
  Administered 2020-05-10: 650 mg via ORAL
  Filled 2020-05-09: qty 2

## 2020-05-09 MED ORDER — HYDROCHLOROTHIAZIDE 25 MG PO TABS
12.5000 mg | ORAL_TABLET | Freq: Every day | ORAL | Status: DC
  Administered 2020-05-09 – 2020-05-10 (×2): 12.5 mg via ORAL
  Filled 2020-05-09 (×2): qty 1

## 2020-05-09 MED ORDER — ONDANSETRON HCL 4 MG/2ML IJ SOLN
4.0000 mg | Freq: Once | INTRAMUSCULAR | Status: AC
  Administered 2020-05-09: 4 mg via INTRAVENOUS
  Filled 2020-05-09: qty 2

## 2020-05-09 MED ORDER — SODIUM CHLORIDE 0.9 % IV BOLUS
500.0000 mL | Freq: Once | INTRAVENOUS | Status: AC
  Administered 2020-05-09: 500 mL via INTRAVENOUS

## 2020-05-09 MED ORDER — ONDANSETRON HCL 4 MG/2ML IJ SOLN: 4.0000 mg | Freq: Four times a day (QID) | INTRAMUSCULAR | Status: DC | PRN

## 2020-05-09 MED ORDER — LEVOTHYROXINE SODIUM 100 MCG PO TABS
100.0000 ug | ORAL_TABLET | Freq: Every day | ORAL | Status: DC
  Administered 2020-05-10: 100 ug via ORAL
  Filled 2020-05-09: qty 1

## 2020-05-09 MED ORDER — ATORVASTATIN CALCIUM 40 MG PO TABS
40.0000 mg | ORAL_TABLET | Freq: Every day | ORAL | Status: DC
  Administered 2020-05-09 – 2020-05-10 (×2): 40 mg via ORAL
  Filled 2020-05-09 (×2): qty 1

## 2020-05-09 MED ORDER — FENOFIBRATE 54 MG PO TABS
54.0000 mg | ORAL_TABLET | Freq: Every day | ORAL | Status: DC
  Administered 2020-05-10: 54 mg via ORAL
  Filled 2020-05-09: qty 1

## 2020-05-09 MED ORDER — LEVOTHYROXINE SODIUM 100 MCG PO TABS
100.0000 ug | ORAL_TABLET | Freq: Every day | ORAL | Status: DC
  Administered 2020-05-09: 100 ug via ORAL
  Filled 2020-05-09: qty 1

## 2020-05-09 MED ORDER — TRAMADOL HCL 50 MG PO TABS: 50.0000 mg | ORAL_TABLET | Freq: Two times a day (BID) | ORAL | Status: DC | PRN

## 2020-05-09 MED ORDER — PANTOPRAZOLE SODIUM 40 MG PO TBEC
40.0000 mg | DELAYED_RELEASE_TABLET | Freq: Two times a day (BID) | ORAL | Status: DC
  Administered 2020-05-09 – 2020-05-10 (×3): 40 mg via ORAL
  Filled 2020-05-09 (×3): qty 1

## 2020-05-09 NOTE — ED Notes (Signed)
Report given to [facility]. 

## 2020-05-09 NOTE — ED Notes (Signed)
ED Provider at bedside. 

## 2020-05-09 NOTE — Progress Notes (Signed)
Paged [facility] admitting to inform, patient has arrived on the unit from [facility].

## 2020-05-09 NOTE — Progress Notes (Signed)
[facility] to [facility] transfer:   Patient with h/o HTN; CVA; AC on Eliquis; pacemaker; and hypothyroidism presenting with chest pain.  L-sided CP and epigastric pain.  Not exertional or pleuritic.  Elevated BPs.  EKG with NSR and no ischemia.  Troponin negative x 2.  Heart score is 5-6.  Seems reasonable to r/o for cardiac origin but GI etiology is a consideration.  Will place in cardiac telemetry observation.   Georgana Curio, M.D.

## 2020-05-09 NOTE — ED Notes (Signed)
Patient transported via [facility] to [facility]

## 2020-05-09 NOTE — ED Notes (Signed)
Pt states she is not supposed to receive IV dye due to kidney function. Dr. Manus Gunning made aware.

## 2020-05-09 NOTE — ED Provider Notes (Signed)
[facility] EMERGENCY DEPARTMENT Provider Note   CSN: [id number] Arrival date & time: 05/08/20  2057     History Chief Complaint  Patient presents with  . Chest Pain    Karina [name] is a 80 y.o. female.  Patient with history of hypertension, hiatal hernia, stroke, anticoagulated Eliquis here with epigastric pain intermittent for the past 2 days.  Contrary to triage note she describes epigastric pain that last for several hours at a time and radiates to her left upper quadrant and left ribs.  Pain is not exertional or pleuritic.  The pain comes and goes lasting for several hours at a time.  She is never had any nausea or vomiting.  No diaphoresis, shortness of breath, cough or fever.  No pain with urination or blood in the urine.  States she has a history of hiatal hernia but has never had this kind of pain before.  She is able to eat and drink without difficulty.  Normal bowel movements.  No black or bloody stools. Denies any known CAD.   Previous cholecystectomy, appendectomy, hysterectomy  The history is provided by the patient.  Chest Pain Associated symptoms: abdominal pain and nausea   Associated symptoms: no dizziness, no fatigue, no fever, no headache, no shortness of breath, no vomiting and no weakness        Past Medical History:  Diagnosis Date  . GERD (gastroesophageal reflux disease)   . Hypertension   . Thyroid disease     Patient Active Problem List   Diagnosis Date Noted  . CVA (cerebral vascular accident) (HCC) 09/09/2019    Past Surgical History:  Procedure Laterality Date  . ABDOMINAL HYSTERECTOMY    . APPENDECTOMY    . CHOLECYSTECTOMY    . EYE SURGERY    . LOOP RECORDER INSERTION N/A 09/10/2019   Procedure: LOOP RECORDER INSERTION;  Surgeon: Regan Lemming, MD;  Location: [facility] INVASIVE CV LAB;  Service: Cardiovascular;  Laterality: N/A;     OB History   No obstetric history on file.     Family History  Problem Relation Age of Onset   . Stroke Maternal Grandfather     Social History   Tobacco Use  . Smoking status: Never Smoker  . Smokeless tobacco: Never Used  Substance Use Topics  . Alcohol use: Never  . Drug use: Never    Home Medications Prior to Admission medications   Medication Sig Start Date End Date Taking? Authorizing Provider  apixaban (ELIQUIS) 5 MG TABS tablet Take 1 tablet (5 mg total) by mouth 2 (two) times daily. 10/22/19   Newman Nip, NP  atorvastatin (LIPITOR) 40 MG tablet Take 1 tablet (40 mg total) by mouth daily. 09/11/19   Meredeth Ide, MD  Calcium Carbonate (CALCIUM 600 PO) Take 1 tablet by mouth in the morning and at bedtime.    [provider]  chlorpheniramine (EQ CHLORTABS) 4 MG tablet Take 4 mg by mouth 2 (two) times daily as needed for allergies.    [provider]  cholecalciferol (VITAMIN D3) 25 MCG (1000 UNIT) tablet Take 1,000 Units by mouth daily.    [provider]  CINNAMON PO Take 2,000 mg by mouth daily.     [provider]  clobetasol cream (TEMOVATE) 0.05 % APPLY TO AFFECTED AREA TWICE DAILY FOR 2 WEEKS THEN DAILY FOR 2 WEEKS AND THEN 3 TIMES A WEEK 09/02/19   [provider]  fenofibrate 54 MG tablet Take 1 tablet (54 mg  total) by mouth daily. 09/11/19   Meredeth Ide, MD  Garlic 1500 MG CAPS Take 1 capsule by mouth daily.    [provider]  hydrochlorothiazide (MICROZIDE) 12.5 MG capsule Take 12.5 mg by mouth daily.    [provider]  levothyroxine (SYNTHROID) 100 MCG tablet Taking one tablet by mouth daily and an extra 1/2 tablet on Sunday 09/22/19   [provider]  magnesium oxide (MAG-OX) 400 MG tablet Take 400 mg by mouth daily.    [provider]  metoprolol tartrate (LOPRESSOR) 50 MG tablet TAKE 1 TABLET TWICE DAILY 03/21/20   Newman Nip, NP  Multiple Vitamin (MULTIVITAMIN ADULT PO) Take 1 tablet by mouth daily.    [provider]  pantoprazole (PROTONIX) 40 MG tablet  Take 40 mg by mouth 2 (two) times daily before a meal.  06/18/16   [provider]  traMADol (ULTRAM) 50 MG tablet Take 50 mg by mouth 2 (two) times daily as needed for moderate pain.  05/25/19   [provider]  vitamin B-12 (CYANOCOBALAMIN) 1000 MCG tablet Take 1,000 mcg by mouth daily.    [provider]    Allergies    Erythromycin, Escitalopram, and Bupropion  Review of Systems   Review of Systems  Constitutional: Negative for activity change, appetite change, fatigue and fever.  HENT: Negative for congestion and rhinorrhea.   Respiratory: Positive for chest tightness. Negative for shortness of breath.   Cardiovascular: Positive for chest pain.  Gastrointestinal: Positive for abdominal pain and nausea. Negative for diarrhea and vomiting.  Genitourinary: Negative for dysuria and hematuria.  Musculoskeletal: Negative for arthralgias and myalgias.  Neurological: Negative for dizziness, weakness and headaches.   all other systems are negative except as noted in the HPI and PMH.    Physical Exam Updated Vital Signs BP (!) 205/64 (BP Location: Right Arm)   Pulse 65   Temp 97.7 F (36.5 C) (Oral)   Resp 18   Ht 5\' 2"  (1.575 m)   Wt 68.9 kg   SpO2 95%   BMI 27.78 kg/m   Physical Exam Vitals and nursing note reviewed.  Constitutional:      General: She is not in acute distress.    Appearance: Normal appearance. She is well-developed.  HENT:     Head: Normocephalic and atraumatic.     Mouth/Throat:     Pharynx: No oropharyngeal exudate.  Eyes:     Conjunctiva/sclera: Conjunctivae normal.     Pupils: Pupils are equal, round, and reactive to light.  Neck:     Comments: No meningismus. Cardiovascular:     Rate and Rhythm: Normal rate and regular rhythm.     Heart sounds: Normal heart sounds. No murmur heard.   Pulmonary:     Effort: Pulmonary effort is normal. No respiratory distress.     Breath sounds: Normal breath sounds.  Chest:     Chest  wall: No tenderness.  Abdominal:     Palpations: Abdomen is soft.     Tenderness: There is abdominal tenderness. There is guarding. There is no rebound.     Comments: Epigastric left upper quadrant tenderness with voluntary guarding, no rebound  Musculoskeletal:        General: No tenderness. Normal range of motion.     Cervical back: Normal range of motion and neck supple.  Skin:    General: Skin is warm.     Capillary Refill: Capillary refill takes less than 2 seconds.  Neurological:  General: No focal deficit present.     Mental Status: She is alert and oriented to person, place, and time. Mental status is at baseline.     Cranial Nerves: No cranial nerve deficit.     Motor: No abnormal muscle tone.     Coordination: Coordination normal.     Comments:  5/5 strength throughout. CN 2-12 intact.Equal grip strength.   Psychiatric:        Behavior: Behavior normal.     ED Results / Procedures / Treatments   Labs (all labs ordered are listed, but only abnormal results are displayed) Labs Reviewed  BASIC METABOLIC PANEL - Abnormal; Notable for the following components:      Result Value   Glucose, Bld 149 (*)    BUN 26 (*)    Creatinine, Ser 1.64 (*)    GFR, Estimated 32 (*)    All other components within normal limits  CBC - Abnormal; Notable for the following components:   WBC 12.0 (*)    HCT 35.5 (*)    Platelets 478 (*)    All other components within normal limits  HEPATIC FUNCTION PANEL - Abnormal; Notable for the following components:   Indirect Bilirubin 0.2 (*)    All other components within normal limits  RESP PANEL BY RT-PCR (FLU A&B, COVID) ARPGX2  LIPASE, BLOOD  LACTIC ACID, PLASMA  TROPONIN I (HIGH SENSITIVITY)  TROPONIN I (HIGH SENSITIVITY)    EKG EKG Interpretation  Date/Time:  Monday May 08 2020 21:11:40 EDT Ventricular Rate:  70 PR Interval:  148 QRS Duration: 72 QT Interval:  398 QTC Calculation: 429 R Axis:   69 Text  Interpretation: Normal sinus rhythm Normal ECG No significant change was found Confirmed by Glynn Octaveancour, Jamiria Langill [phone]([id number]) on 05/09/2020 1:32:58 AM   Radiology CT ABDOMEN PELVIS WO CONTRAST  Result Date: 05/09/2020 CLINICAL DATA:  80 year old female with abdominal pain. History of ovarian cancer. EXAM: CT ABDOMEN AND PELVIS WITHOUT CONTRAST TECHNIQUE: Multidetector CT imaging of the abdomen and pelvis was performed following the standard protocol without IV contrast. COM[facility]:  [facility] Chest, Abdomen, and Pelvis 04/09/2013 (no images available). [facility] CT Abdomen and Pelvis 02/07/2012. FINDINGS: Lower chest: Stable cardiac size at the upper limits of normal. No pericardial effusion. No superior diaphragmatic or cardiophrenic angle lymphadenopathy now. No pericardial or pleural effusion. Negative lung bases aside from mild atelectasis or scarring. Hepatobiliary: Chronically absent gallbladder. Negative noncontrast liver. Pancreas: Negative. Spleen: Negative. Adrenals/Urinary Tract: Normal adrenal glands. Bilateral nephrolithiasis ranging from punctate to 9 mm in the right kidney. No hydronephrosis. No perinephric stranding. Chronic right renal upper pole cortical scarring is new or increased since 2014. Otherwise stable noncontrast appearance of both kidneys. Ureters are decompressed and negative. Negative bladder. Stomach/Bowel: Redundant large bowel, although mostly decompressed from the splenic flexure distally. Mild retained stool in the rectum. Mild retained stool in the transverse and right colon. Diminutive or absent appendix. No large bowel inflammation. Negative terminal ileum. No dilated small bowel. Decompressed stomach. 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 This measures about 6 mm diameter (series 2, image 28) and was 5 cm in 2014. No regional  inflammation. The remaining duodenum is decompressed. No free air, free fluid, mesenteric stranding. Vascular/Lymphatic: Severe Aortoiliac calcified atherosclerosis. Tortuous abdominal aorta, with infrarenal segment measuring up to 28 mm diameter, increased from 26 mm in 2014. Recommend follow-up ultrasound every 5  years. This recommendation follows ACR consensus guidelines: White Paper of the ACR Incidental Findings Committee II on Vascular Findings. J Am Coll Radiol 2013; 10:789-794. Vascular patency is not evaluated in the absence of IV contrast. No lymphadenopathy. Reproductive: Surgically absent. No pelvic sidewall lymphadenopathy. Other: No pelvic free fluid. Musculoskeletal: Compression fractures of T11 and L1 are new since 2014 but age indeterminate. Minimal retropulsion. Associated chronic severe T11-T12 disc degeneration with partially calcified disc extrusion. No suspicious osseous lesion identified. IMPRESSION: 1. Age indeterminate compression fractures of T11 and L1, new since 2014. No complicating features. If specific therapy such as vertebroplasty is desired, Lumbar MRI or Nuclear Medicine Whole-body Bone Scan would best determine acuity. 2. Otherwise no acute or metastatic process identified in the abdomen or pelvis. Resolved superior diaphragmatic lymphadenopathy since 2014. 3. 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 This has enlarged from 5 cm in 2014, but there is no regional inflammation. 4. Bilateral nephrolithiasis. No obstructive uropathy. 5. Small infrarenal abdominal aortic aneurysm, increased by 2 mm since 2014. Recommend follow-up ultrasound every 5 years. Aortic Atherosclerosis (ICD10-I70.0). Electronically Signed   By: Odessa Fleming M.D.   On: 05/09/2020 04:23   DG Chest 2 View  Result Date: 05/08/2020 CLINICAL DATA:  Chest pain EXAM: CHEST - 2 VIEW COMPARISON:  08/24/2016 FINDINGS: Left Port-A-Cath remains in place,  unchanged. Loop recorder projects over the left chest. Heart is normal size. No confluent airspace opacities or effusions. IMPRESSION: No active cardiopulmonary disease. Electronically Signed   By: Charlett Nose M.D.   On: 05/08/2020 22:20    Procedures Procedures   Medications Ordered in ED Medications  fentaNYL (SUBLIMAZE) injection 50 mcg (has no administration in time range)  ondansetron (ZOFRAN) injection 4 mg (has no administration in time range)  sodium chloride 0.9 % bolus 500 mL (has no administration in time range)    ED Course  I have reviewed the triage vital signs and the nursing notes.  Pertinent labs & imaging results that were available during my care of the patient were reviewed by me and considered in my medical decision making (see chart for details).    MDM Rules/Calculators/A&P                         Epigastric pain that radiates to her left upper quadrant intermittent for the past 2 days.  EKG is sinus rhythm and chest x-ray is negative  LFTs and lipase normal. Troponin negative.  Heart score 5-6 Patient with seemingly epigastric pain that radiates into her chest but calls it chest pain.  BP elevated. Creatinine near baseline. With epigastric pain, CT imaging obtained, patient states she was told not to have contrast because of her kidney function, though her GFR seems to permit low dose.  Pain atypical for PE or aortic dissection . CT with compression fractures, duodenal diverticulum. Plan observation rule out for cardiac cause of pain, GI origin is possible.  D/w Dr. Ophelia Charter. Final Clinical Impression(s) / ED Diagnoses Final diagnoses:  Nonspecific chest pain  Epigastric pain    Rx / DC Orders ED Discharge Orders    None       Isaiah Cianci, Jeannett Senior, MD 05/09/20 [phone]

## 2020-05-09 NOTE — Plan of Care (Signed)
  Problem: Education: Goal: Knowledge of General Education information will improve Description Including pain rating scale, medication(s)/side effects and non-pharmacologic comfort measures Outcome: Progressing   Problem: Education: Goal: Knowledge of General Education information will improve Description Including pain rating scale, medication(s)/side effects and non-pharmacologic comfort measures Outcome: Progressing   

## 2020-05-09 NOTE — ED Notes (Signed)
Pt b/p 201/76-EDP Zackowski notified

## 2020-05-09 NOTE — H&P (Signed)
History and Physical:    Karina [name]   ZOX:[id number] DOB: 12-28-1940 DOA: 05/09/2020  Referring MD/provider: [facility] PCP: Brooke Bonito, MD   Patient coming from: Home  Chief Complaint: Left-sided chest/upper abdominal pain  History of Present Illness:   Karina [name] is an 80 y.o. female with PMH significant for HTN, H/O CVA, atrial fibrillation on Eliquis, hypothyroidism who was in her usual state of health until 3 days ago when she had onset of substernal chest pressure with radiation to the left side "under my left breast" and  epigastric and left upper quadrant area.  Patient states that the pain would come and go randomly, not associated with any particular movement or exertion.  She may be had some mild shortness of breath with it but is not sure.  No nausea, vomiting or diaphoresis associated with it.  She does admit to history of GERD however denies that this feels like reflux.  Patient is status post cholecystectomy.  Patient denies recent fevers or chills or malaise.  Denies ever having chest/epigastric pain like this before.  As noted above no associated shortness of breath or DOE.  Patient admits that she is not very active but does help her husband make the bed and occasionally does laundry when she feels well enough.  She denies any exertional component to this chest/epigastric pain.  ED Course:  The patient was seen at [facility] where she was noted to be afebrile and quite hypertensive.  Per their exam she had epigastric tenderness with guarding and underwent a CT abdomen and pelvis without contrast which revealed no acute or metastatic process in the abdomen or pelvis.  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  Chest x-ray was negative.  EKG was without any acute ST-T wave changes and her troponins were noted to be 5.  Patient was noted have a heart score of 5-6 and she was accepted for admission to [facility] for cardiac  work-up.  ROS:   ROS   Review of Systems: General: Denies fever, chills, malaise,  Eyes: Denies recent change in vision, no discharge, redness, pain noted Endocrine: Denies heat/cold intolerance, polyuria or weight loss. Respiratory: Denies cough, SOB at rest or hemoptysis GI: Denies nausea, vomiting, diarrhea or constipation GU: Denies dysuria, frequency or hematuria   Past Medical History:   Past Medical History:  Diagnosis Date  . GERD (gastroesophageal reflux disease)   . Hypertension   . Thyroid disease     Past Surgical History:   Past Surgical History:  Procedure Laterality Date  . ABDOMINAL HYSTERECTOMY    . APPENDECTOMY    . CHOLECYSTECTOMY    . EYE SURGERY    . LOOP RECORDER INSERTION N/A 09/10/2019   Procedure: LOOP RECORDER INSERTION;  Surgeon: Regan Lemming, MD;  Location: [facility] INVASIVE [facility];  Service: Cardiovascular;  Laterality: N/A;    Social History:   Social History   Socioeconomic History  . Marital status: Married    Spouse name: Not on file  . Number of children: Not on file  . Years of education: Not on file  . Highest education level: Not on file  Occupational History  . Not on file  Tobacco Use  . Smoking status: Former Games developer  . Smokeless tobacco: Never Used  . Tobacco comment: smoked almost 40 years & quit in 2014  Vaping Use  . Vaping Use: Never used  Substance and Sexual Activity  .  Alcohol use: Never  . Drug use: Never  . Sexual activity: Not on file  Other Topics Concern  . Not on file  Social History Narrative  . Not on file   Social Determinants of Health   Financial Resource Strain: Not on file  Food Insecurity: Not on file  Transportation Needs: Not on file  Physical Activity: Not on file  Stress: Not on file  Social Connections: Not on file  Intimate Partner Violence: Not on file    Allergies   Erythromycin, Escitalopram, and Bupropion  Family history:   Family History  Problem Relation Age of  Onset  . Stroke Maternal Grandfather     Current Medications:   Prior to Admission medications   Medication Sig Start Date End Date Taking? Authorizing Provider  apixaban (ELIQUIS) 5 MG TABS tablet Take 1 tablet (5 mg total) by mouth 2 (two) times daily. 10/22/19  Yes Newman Nip, NP  atorvastatin (LIPITOR) 40 MG tablet Take 1 tablet (40 mg total) by mouth daily. 09/11/19  Yes Meredeth Ide, MD  azelastine (ASTELIN) 0.1 % nasal spray Place 1-2 sprays into the nose 2 (two) times daily as needed for allergies. 03/20/20  Yes [provider]  Calcium Carbonate (CALCIUM 600 PO) Take 1 tablet by mouth in the morning and at bedtime.   Yes [provider]  chlorpheniramine (CHLOR-TRIMETON) 4 MG tablet Take 4 mg by mouth 2 (two) times daily as needed for allergies.   Yes [provider]  cholecalciferol (VITAMIN D3) 25 MCG (1000 UNIT) tablet Take 1,000 Units by mouth daily.   Yes [provider]  CINNAMON PO Take 1,000 mg by mouth in the morning and at bedtime.   Yes [provider]  clobetasol cream (TEMOVATE) 0.05 % Apply 1 application topically 2 (two) times daily. 09/02/19  Yes [provider]  fenofibrate 54 MG tablet Take 1 tablet (54 mg total) by mouth daily. 09/11/19  Yes Meredeth Ide, MD  Garlic 1500 MG CAPS Take 1,500 mg by mouth daily.   Yes [provider]  hydrochlorothiazide (MICROZIDE) 12.5 MG capsule Take 12.5 mg by mouth daily.   Yes [provider]  levothyroxine (SYNTHROID) 100 MCG tablet Take 100-150 mcg by mouth See admin instructions. Taking 1 & 1/2 tab ( ) on Sunday only. All other days 100 mcg daily. 09/22/19  Yes [provider]  magnesium oxide (MAG-OX) 400 MG tablet Take 400 mg by mouth daily.   Yes [provider]  metoprolol tartrate (LOPRESSOR) 50 MG tablet TAKE 1 TABLET TWICE DAILY Patient taking differently: Take 50 mg by mouth 2 (two) times daily. 03/21/20  Yes Newman Nip,  NP  Multiple Vitamin (MULTIVITAMIN ADULT PO) Take 1 tablet by mouth daily.   Yes [provider]  pantoprazole (PROTONIX) 40 MG tablet Take 40 mg by mouth 2 (two) times daily before a meal.  06/18/16  Yes [provider]  traMADol (ULTRAM) 50 MG tablet Take 50 mg by mouth 2 (two) times daily as needed for moderate pain.  05/25/19  Yes [provider]  vitamin B-12 (CYANOCOBALAMIN) 1000 MCG tablet Take 1,000 mcg by mouth daily.   Yes [provider]    Physical Exam:   Vitals:   05/09/20 1148 05/09/20 1150 05/09/20 1304 05/09/20 1305  BP: (!) 178/67 (!) 178/67  (!) 175/64  Pulse: 76 76  78  Resp: (!) 22 (!) 22  18  Temp:      TempSrc:  Oral  SpO2: 94% 94%  96%  Weight:   67.3 kg   Height:   5\' 2"  (1.575 m)      Physical Exam: Blood pressure (!) 175/64, pulse 78, temperature 97.7 F (36.5 C), temperature source Oral, resp. rate 18, height 5\' 2"  (1.575 m), weight 67.3 kg, SpO2 96 %. Gen: Tired appearing female lying in bed in no acute distress. Eyes: sclera anicteric, conjuctiva mildly injected bilaterally CVS: S1-S2, irregular, no gallops Respiratory: Reasonable air entry bilaterally with no adventitious sounds. GI: NABS, soft, patient's abdominal exam was nontender to light or deep palpation to my exam.  There was no guarding, no rebound, no pulsatile mass.  Essentially normal abdominal exam LE: No edema. No cyanosis Neuro: A/O x 3, Moving all extremities equally with normal strength, CN 3-12 intact, grossly nonfocal.    Data Review:    Labs: Basic Metabolic Panel: Recent Labs  Lab 05/08/20 2126  NA 137  K 4.1  CL 101  CO2 27  GLUCOSE 149*  BUN 26*  CREATININE 1.64*  CALCIUM 9.2   Liver Function Tests: Recent Labs  Lab 05/08/20 2126  AST 40  ALT 19  ALKPHOS 61  BILITOT 0.3  PROT 7.5  ALBUMIN 3.9   Recent Labs  Lab 05/08/20 2126  LIPASE 35   No results for input(s): AMMONIA in the last 168 hours. CBC: Recent Labs   Lab 05/08/20 2126  WBC 12.0*  HGB 12.0  HCT 35.5*  MCV 86.0  PLT 478*   Cardiac Enzymes: No results for input(s): CKTOTAL, CKMB, CKMBINDEX, TROPONINI in the last 168 hours.  BNP (last 3 results) No results for input(s): PROBNP in the last 8760 hours. CBG: No results for input(s): GLUCAP in the last 168 hours.  Urinalysis No results found for: COLORURINE, APPEARANCEUR, LABSPEC, PHURINE, GLUCOSEU, HGBUR, BILIRUBINUR, KETONESUR, PROTEINUR, UROBILINOGEN, NITRITE, LEUKOCYTESUR    Radiographic Studies: CT ABDOMEN PELVIS WO CONTRAST  Result Date: 05/09/2020 CLINICAL DATA:  80 year old female with abdominal pain. History of ovarian cancer. EXAM: CT ABDOMEN AND PELVIS WITHOUT CONTRAST TECHNIQUE: Multidetector CT imaging of the abdomen and pelvis was performed following the standard protocol without IV contrast. COMPARISON:  Report of [facility] restaging CT Chest, Abdomen, and Pelvis 04/09/2013 (no images available). [facility] CT Abdomen and Pelvis 02/07/2012. FINDINGS: Lower chest: Stable cardiac size at the upper limits of normal. No pericardial effusion. No superior diaphragmatic or cardiophrenic angle lymphadenopathy now. No pericardial or pleural effusion. Negative lung bases aside from mild atelectasis or scarring. Hepatobiliary: Chronically absent gallbladder. Negative noncontrast liver. Pancreas: Negative. Spleen: Negative. Adrenals/Urinary Tract: Normal adrenal glands. Bilateral nephrolithiasis ranging from punctate to 9 mm in the right kidney. No hydronephrosis. No perinephric stranding. Chronic right renal upper pole cortical scarring is new or increased since 2014. Otherwise stable noncontrast appearance of both kidneys. Ureters are decompressed and negative. Negative bladder. Stomach/Bowel: Redundant large bowel, although mostly decompressed from the splenic flexure distally. Mild retained stool in the rectum. Mild retained stool in the  transverse and right colon. Diminutive or absent appendix. No large bowel inflammation. Negative terminal ileum. No dilated small bowel. Decompressed stomach. 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 This measures about 6 mm diameter (series 2, image 28) and was 5 cm in 2014. No regional inflammation. The remaining duodenum is decompressed. No free air, free fluid, mesenteric stranding. Vascular/Lymphatic: Severe Aortoiliac calcified atherosclerosis. Tortuous abdominal aorta, with infrarenal segment measuring  up to 28 mm diameter, increased from 26 mm in 2014. Recommend follow-up ultrasound every 5 years. This recommendation follows ACR consensus guidelines: White Paper of the ACR Incidental Findings Committee II on Vascular Findings. J Am Coll Radiol 2013; 10:789-794. Vascular patency is not evaluated in the absence of IV contrast. No lymphadenopathy. Reproductive: Surgically absent. No pelvic sidewall lymphadenopathy. Other: No pelvic free fluid. Musculoskeletal: Compression fractures of T11 and L1 are new since 2014 but age indeterminate. Minimal retropulsion. Associated chronic severe T11-T12 disc degeneration with partially calcified disc extrusion. No suspicious osseous lesion identified. IMPRESSION: 1. Age indeterminate compression fractures of T11 and L1, new since 2014. No complicating features. If specific therapy such as vertebroplasty is desired, Lumbar MRI or Nuclear Medicine Whole-body Bone Scan would best determine acuity. 2. Otherwise no acute or metastatic process identified in the abdomen or pelvis. Resolved superior diaphragmatic lymphadenopathy since 2014. 3. 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 This has enlarged from 5 cm in 2014, but there is no regional inflammation. 4. Bilateral nephrolithiasis. No obstructive uropathy. 5. Small infrarenal  abdominal aortic aneurysm, increased by 2 mm since 2014. Recommend follow-up ultrasound every 5 years. Aortic Atherosclerosis (ICD10-I70.0). Electronically Signed   By: Odessa FlemingH  Hall M.D.   On: 05/09/2020 04:23   DG Chest 2 View  Result Date: 05/08/2020 CLINICAL DATA:  Chest pain EXAM: CHEST - 2 VIEW COMPARISON:  08/24/2016 FINDINGS: Left Port-A-Cath remains in place, unchanged. Loop recorder projects over the left chest. Heart is normal size. No confluent airspace opacities or effusions. IMPRESSION: No active cardiopulmonary disease. Electronically Signed   By: Charlett NoseKevin  Dover M.D.   On: 05/08/2020 22:20    EKG: Independently reviewed.  EKG from yesterday for 422 revealed sinus rhythm at 70.  Normal intervals.  Normal axis.  Normal EKG.   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   Atypical chest/epigastric pain in patient with known cerebrovascular disease Patient was initially thought to have primarily [facility] On my exam I did not find any epigastric/abdominal tenderness, with no guarding or any abnormality. Given elevated heart score, patient was referred here for possible cardiac etiology of discomfort. Given atypical nature of pain, reassuring EKG and normal troponins, will not start aspirin at present Cardiology consultation requested to evaluate for further need for diagnostic testing. Continue metoprolol  Atrial fibrillation Patient is followed in atrial fibrillation clinic Continue Eliquis for secondary prevention of stroke Continue metoprolol tart tartrate 50 twice daily for rate control  HTN Continue metoprolol, HCTZ per home dose  Hypothyroid Continue Synthroid TSH in the morning  H/oh CVA Patient apparently had been treated with DAPT x3 weeks with continuation of Plavix. However  patient does not appear to be on Plavix at present Continue atorvastatin and fenofibrate  GERD Continue Protonix   Other information:   DVT prophylaxis: Eliquis ordered. Code Status: Full Family Communication: None, patient states her husband knows she is here Disposition Plan: Home Consults called: Cardiology Admission status: Observation  Quasean Frye Tublu Olevia Westervelt [facility]  If 7PM-7AM, please contact night-coverage [email or web address] Password [email or web address] 05/09/2020, 3:18 PM

## 2020-05-09 NOTE — Consult Note (Addendum)
Cardiology Consultation:   Patient ID: Karina [name] MRN: [id number]; DOB: [birth year]  Admit date: 05/09/2020 Date of Consult: 05/09/2020  PCP:  Brooke Bonito, MD   [facility]  Cardiologist:  No primary care provider on file.  Advanced Practice Provider:  No care team member to display Electrophysiologist:  None    Patient Profile:   Karina [name] is a 80 y.o. 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 Luberta Robertson.  History of Present Illness:   Karina [name] is a 80 yo female with PMH noted above. She was dx with an embolic CVA in 09/2019 and seen by Dr. Elberta Fortis for loop recorder implant. Linq noted episode of afib, and sent to Afib clinic for discussion of OAC. Seen by Rudi Coco, plavix was stopped and placed on Eliquis 5mg  BID with metoprolol 25mg  BID continued. Seen back in follow up 3 weeks later. noted a recurrent episode of afib the day prior. Her metoprolol was increased to 50mg  BID.   Presented to the [facility] ED on with chest pain. Reports she developed on Sunday, she thinks in the morning. Could have been after eating breakfast, but she is unclear. She has a difficult time describing the pain. Dull, achy? Lingered into Monday morning. Eventually presented to [facility] ED. She lives at home and functions independently. Has never had chest pain with her daily activities in the past. Does report taking tylenol arthritis with improvement in her pain. Some chest pain with inspiration.   In the ED her labs showed stable electrolytes, Cr 1.64, hsTn 5>>5, WBC 12.0, Hgb 12.0. EKG showed NSR 70bpm, no acute ST/T wave changes. CXR negative. CT chest /abd/pelvis with indeterminate compression fractures at T11 and L1,  large duodenal diverticulum and small infrarenal AAA of 66mm. Given her elevated heart score she  was transferred to [facility] for further management to IM service.    Past Medical History:  Diagnosis Date  . GERD (gastroesophageal reflux disease)   . Hypertension   . Thyroid disease     Past Surgical History:  Procedure Laterality Date  . ABDOMINAL HYSTERECTOMY    . APPENDECTOMY    . CHOLECYSTECTOMY    . EYE SURGERY    . LOOP RECORDER INSERTION N/A 09/10/2019   Procedure: LOOP RECORDER INSERTION;  Surgeon: 41m, MD;  Location: [facility] INVASIVE CV LAB;  Service: Cardiovascular;  Laterality: N/A;     Home Medications:  Prior to Admission medications   Medication Sig Start Date End Date Taking? Authorizing Provider  apixaban (ELIQUIS) 5 MG TABS tablet Take 1 tablet (5 mg total) by mouth 2 (two) times daily. 10/22/19  Yes 11/10/2019, NP  atorvastatin (LIPITOR) 40 MG tablet Take 1 tablet (40 mg total) by mouth daily. 09/11/19  Yes 10/24/19, MD  azelastine (ASTELIN) 0.1 % nasal spray Place 1-2 sprays into the nose 2 (two) times daily as needed for allergies. 03/20/20  Yes [provider]  Calcium Carbonate (CALCIUM 600 PO) Take 1 tablet by mouth in the morning and at bedtime.   Yes [provider]  chlorpheniramine (CHLOR-TRIMETON) 4 MG tablet Take 4 mg by mouth 2 (two) times daily as needed for allergies.   Yes [provider]  cholecalciferol (VITAMIN D3) 25 MCG (1000 UNIT) tablet Take 1,000 Units by mouth daily.   Yes [provider]  CINNAMON PO Take 1,000 mg by mouth in the morning and at bedtime.   Yes [provider]  clobetasol cream (TEMOVATE) 0.05 % Apply 1 application topically 2 (two) times daily. 09/02/19  Yes [provider]  fenofibrate 54 MG tablet Take 1 tablet (54 mg total) by mouth daily. 09/11/19  Yes Meredeth IdeLama, Gagan S, MD  Garlic 1500 MG CAPS Take 1,500 mg by mouth daily.   Yes [provider]  hydrochlorothiazide (MICROZIDE) 12.5 MG capsule Take 12.5 mg by mouth daily.   Yes [provider]  levothyroxine (SYNTHROID) 100 MCG tablet Take 100-150 mcg by mouth See admin instructions. Taking 1 & 1/2 tab (150mcg ) on Sunday only. All other days 100 mcg daily. 09/22/19  Yes [provider]  magnesium oxide (MAG-OX) 400 MG tablet Take 400 mg by mouth daily.   Yes [provider]  metoprolol tartrate (LOPRESSOR) 50 MG tablet TAKE 1 TABLET TWICE DAILY Patient taking differently: Take 50 mg by mouth 2 (two) times daily. 03/21/20  Yes Newman Niparroll, Donna C, NP  Multiple Vitamin (MULTIVITAMIN ADULT PO) Take 1 tablet by mouth daily.   Yes [provider]  pantoprazole (PROTONIX) 40 MG tablet Take 40 mg by mouth 2 (two) times daily before a meal.  06/18/16  Yes [provider]  traMADol (ULTRAM) 50 MG tablet Take 50 mg by mouth 2 (two) times daily as needed for moderate pain.  05/25/19  Yes [provider]  vitamin B-12 (CYANOCOBALAMIN) 1000 MCG tablet Take 1,000 mcg by mouth daily.   Yes [provider]    Inpatient Medications: Scheduled Meds: . apixaban  5 mg Oral BID  . atorvastatin  40 mg Oral Daily  . fenofibrate  54 mg Oral Daily  . hydrochlorothiazide  12.5 mg Oral Daily  . [START ON 05/10/2020] levothyroxine  100 mcg Oral Daily  . magnesium oxide  400 mg Oral Daily  . metoprolol tartrate  50 mg Oral BID  . pantoprazole  40 mg Oral BID AC   Continuous Infusions:  PRN Meds: acetaminophen, ondansetron (ZOFRAN) IV  Allergies:    Allergies  Allergen Reactions  . Erythromycin     itching  . Escitalopram Other (See Comments)    insomnia  . Bupropion Itching and Other (See Comments)    insomnia Unknown Unknown insomnia     Social History:   Social History   Socioeconomic History  . Marital status: Married    Spouse name: Not on file  . Number of children: Not on file  . Years of education: Not on file  . Highest education level: Not on file  Occupational History  . Not on file  Tobacco Use  . Smoking status: Former  Games developermoker  . Smokeless tobacco: Never Used  . Tobacco comment: smoked almost 40 years & quit in 2014  Vaping Use  . Vaping Use: Never used  Substance and Sexual Activity  . Alcohol use: Never  . Drug use: Never  . Sexual activity: Not on file  Other Topics Concern  . Not on file  Social History Narrative  . Not on file   Social Determinants of Health   Financial Resource Strain: Not on file  Food Insecurity: Not on file  Transportation Needs: Not on file  Physical Activity: Not on file  Stress: Not on file  Social Connections: Not on file  Intimate Partner Violence: Not on file    Family History:    Family History  Problem  Relation Age of Onset  . Stroke Maternal Grandfather      ROS:  Please see the history of present illness.   All other ROS reviewed and negative.     Physical Exam/Data:   Vitals:   05/09/20 1150 05/09/20 1304 05/09/20 1305 05/09/20 1637  BP: (!) 178/67  (!) 175/64 (!) 175/70  Pulse: 76  78 77  Resp: (!) 22  18   Temp:    98.7 F (37.1 C)  TempSrc:   Oral Oral  SpO2: 94%  96% 94%  Weight:  67.3 kg    Height:  5\' 2"  (1.575 m)      Intake/Output Summary (Last 24 hours) at 05/09/2020 1711 Last data filed at 05/09/2020 0403 Gross per 24 hour  Intake 500.52 ml  Output --  Net 500.52 ml   Last 3 Weights 05/09/2020 05/08/2020 01/25/2020  Weight (lbs) 148 lb 5.9 oz 151 lb 14.4 oz 146 lb 3.2 oz  Weight (kg) 67.3 kg 68.901 kg 66.316 kg     Body mass index is 27.14 kg/m.  General:  Well nourished, well developed, in no acute distress HEENT: normal Lymph: no adenopathy Neck: no JVD Cardiac:  normal S1, S2; RRR; no murmur  Lungs:  clear to auscultation bilaterally, no wheezing, rhonchi or rales  Abd: soft, nontender, no hepatomegaly  Ext: no edema Musculoskeletal:  No deformities, BUE and BLE strength normal and equal Skin: warm and dry  Neuro:  CNs 2-12 intact, no focal abnormalities noted Psych:  Normal affect   EKG:  The EKG was personally  reviewed and demonstrates:  NSR with no acute ST/T wave changes   Relevant CV Studies:  Echo: 09/2019  IMPRESSIONS    1. Left ventricular ejection fraction, by estimation, is 60 to 65%. The  left ventricle has normal function. The left ventricle has no regional  wall motion abnormalities. Left ventricular diastolic parameters are  consistent with Grade I diastolic  dysfunction (impaired relaxation). Elevated left atrial pressure.  2. Right ventricular systolic function is normal. The right ventricular  size is normal.  3. Left atrial size was mildly dilated.  4. The mitral valve is normal in structure. Trivial mitral valve  regurgitation. No evidence of mitral stenosis.  5. The aortic valve is tricuspid. Aortic valve regurgitation is mild.  Mild aortic valve sclerosis is present, with no evidence of aortic valve  stenosis.  6. The inferior vena cava is normal in size with greater than 50%  respiratory variability, suggesting right atrial pressure of 3 mmHg.   Laboratory Data:  High Sensitivity Troponin:   Recent Labs  Lab 05/08/20 2126 05/09/20 0421  TROPONINIHS 5 5     Chemistry Recent Labs  Lab 05/08/20 2126  NA 137  K 4.1  CL 101  CO2 27  GLUCOSE 149*  BUN 26*  CREATININE 1.64*  CALCIUM 9.2  GFRNONAA 32*  ANIONGAP 9    Recent Labs  Lab 05/08/20 2126  PROT 7.5  ALBUMIN 3.9  AST 40  ALT 19  ALKPHOS 61  BILITOT 0.3   Hematology Recent Labs  Lab 05/08/20 2126 05/09/20 1610  WBC 12.0* 11.8*  RBC 4.13 3.90  HGB 12.0 11.2*  HCT 35.5* 34.1*  MCV 86.0 87.4  MCH 29.1 28.7  MCHC 33.8 32.8  RDW 13.7 13.9  PLT 478* 400   BNPNo results for input(s): BNP, PROBNP in the last 168 hours.  DDimer No results for input(s): DDIMER in the last 168 hours.   Radiology/Studies:  CT ABDOMEN PELVIS WO CONTRAST  Result Date: 05/09/2020 CLINICAL DATA:  80 year old female with abdominal pain. History of ovarian cancer. EXAM: CT ABDOMEN AND PELVIS WITHOUT  CONTRAST TECHNIQUE: Multidetector CT imaging of the abdomen and pelvis was performed following the standard protocol without IV contrast. COMPARISON:  Report of [facility] restaging CT Chest, Abdomen, and Pelvis 04/09/2013 (no images available). [facility] CT Abdomen and Pelvis 02/07/2012. FINDINGS: Lower chest: Stable cardiac size at the upper limits of normal. No pericardial effusion. No superior diaphragmatic or cardiophrenic angle lymphadenopathy now. No pericardial or pleural effusion. Negative lung bases aside from mild atelectasis or scarring. Hepatobiliary: Chronically absent gallbladder. Negative noncontrast liver. Pancreas: Negative. Spleen: Negative. Adrenals/Urinary Tract: Normal adrenal glands. Bilateral nephrolithiasis ranging from punctate to 9 mm in the right kidney. No hydronephrosis. No perinephric stranding. Chronic right renal upper pole cortical scarring is new or increased since 2014. Otherwise stable noncontrast appearance of both kidneys. Ureters are decompressed and negative. Negative bladder. Stomach/Bowel: Redundant large bowel, although mostly decompressed from the splenic flexure distally. Mild retained stool in the rectum. Mild retained stool in the transverse and right colon. Diminutive or absent appendix. No large bowel inflammation. Negative terminal ileum. No dilated small bowel. Decompressed stomach. 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 This measures about 6 mm diameter (series 2, image 28) and was 5 cm in 2014. No regional inflammation. The remaining duodenum is decompressed. No free air, free fluid, mesenteric stranding. Vascular/Lymphatic: Severe Aortoiliac calcified atherosclerosis. Tortuous abdominal aorta, with infrarenal segment measuring up to 28 mm diameter, increased from 26 mm in 2014. Recommend follow-up ultrasound every 5 years. This  recommendation follows ACR consensus guidelines: White Paper of the ACR Incidental Findings Committee II on Vascular Findings. J Am Coll Radiol 2013; 10:789-794. Vascular patency is not evaluated in the absence of IV contrast. No lymphadenopathy. Reproductive: Surgically absent. No pelvic sidewall lymphadenopathy. Other: No pelvic free fluid. Musculoskeletal: Compression fractures of T11 and L1 are new since 2014 but age indeterminate. Minimal retropulsion. Associated chronic severe T11-T12 disc degeneration with partially calcified disc extrusion. No suspicious osseous lesion identified. IMPRESSION: 1. Age indeterminate compression fractures of T11 and L1, new since 2014. No complicating features. If specific therapy such as vertebroplasty is desired, Lumbar MRI or Nuclear Medicine Whole-body Bone Scan would best determine acuity. 2. Otherwise no acute or metastatic process identified in the abdomen or pelvis. Resolved superior diaphragmatic lymphadenopathy since 2014. 3. 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 This has enlarged from 5 cm in 2014, but there is no regional inflammation. 4. Bilateral nephrolithiasis. No obstructive uropathy. 5. Small infrarenal abdominal aortic aneurysm, increased by 2 mm since 2014. Recommend follow-up ultrasound every 5 years. Aortic Atherosclerosis (ICD10-I70.0). Electronically Signed   By: Odessa Fleming M.D.   On: 05/09/2020 04:23   DG Chest 2 View  Result Date: 05/08/2020 CLINICAL DATA:  Chest pain EXAM: CHEST - 2 VIEW COMPARISON:  08/24/2016 FINDINGS: Left Port-A-Cath remains in place, unchanged. Loop recorder projects over the left chest. Heart is normal size. No confluent airspace opacities or effusions. IMPRESSION: No active cardiopulmonary disease. Electronically Signed   By: Charlett Nose M.D.   On: 05/08/2020 22:20     Assessment and Plan:   Karina [name] is a 80 y.o. 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 Luberta Robertson.  1. Chest pain: has very atypical features but known hx if carotid disease. hsTn neg x2 and EKG without ischemia. Given RFs for CAD, will further risk stratify with lexiscan myoview tomorrow.  -- npo at midnight -- does report somewhat pleuritic chest pain, will check Ddimer to rule out PE  2. Paroxsymal Afib: in NSR.  -- continue on BB and Eliquis 5mg  BID  3. HTN: blood pressures were quite elevated on admission, but improving.  -- continue BB and HCTZ -- may need additional titration/meds pending readings  4. HLD: on statin  5. Bilateral carotid artery disease: s/p bilateral CEA. Followed by [facility] in [place].  Risk Assessment/Risk Scores:   HEAR Score (for undifferentiated chest pain):  HEAR Score: 4   CHA2DS2-VASc Score = 7  }This indicates a 11.2% annual risk of stroke. The patient's score is based upon: CHF History: No HTN History: Yes Diabetes History: No Stroke History: Yes Vascular Disease History: Yes Age Score: 2 Gender Score: 1    For questions or updates, please contact [facility] Please consult [email or web address] for contact info under    Signed, , NP  05/09/2020 5:11 PM   Attending Note:   The patient was seen and examined.  Agree with assessment and plan as noted above.  Changes made to the above note as needed.  Patient seen and independently examined with 07/09/2020, NP .   We discussed all aspects of the encounter. I agree with the assessment and plan as stated above.  1.  Chest pain :   Very difficult to sort out her history because the patient is a very poor historian.  She does have a history of carotid artery disease and hypertension.  She presents with some vague diffuse pain for the past several days.  Sometimes she describes it as  pleuritic.  At other times it does not seem to matter whether she is taking a deep breath or not.  We will schedule her for a Lexiscan Myoview study tomorrow.  I would also like to draw D-dimer.  She may need further evaluation for a pulmonary embolus if the D-dimer is elevated. .   2.  Hyperlipidemia: She is on atorvastatin 40 mg a day.  Her last lipid profile was drawn in August, 2021 which reveals a total cholesterol of 159, HDL is 28, LDL is 84, triglyceride level is 233.   I have spent a total of 40 minutes with patient reviewing hospital  notes , telemetry, EKGs, labs and examining patient as well as establishing an assessment and plan that was discussed with the patient. > 50% of time was spent in direct patient care.    08-29-2002, Vesta Mixer., MD, [facility] 05/09/2020, 5:30 PM [address]  [address] Office [phone] Pager [phone]

## 2020-05-09 NOTE — ED Notes (Signed)
Pt provided with graham crackers and peanut butter and drink per request

## 2020-05-09 NOTE — ED Notes (Signed)
Oxygen decreased to 2L via [place]

## 2020-05-09 NOTE — ED Notes (Signed)
Attempted urine sample. PT unable to provide.

## 2020-05-10 ENCOUNTER — Observation Stay (HOSPITAL_BASED_OUTPATIENT_CLINIC_OR_DEPARTMENT_OTHER): Payer: Medicare HMO

## 2020-05-10 DIAGNOSIS — I480 Paroxysmal atrial fibrillation: Secondary | ICD-10-CM | POA: Diagnosis not present

## 2020-05-10 DIAGNOSIS — I482 Chronic atrial fibrillation, unspecified: Secondary | ICD-10-CM

## 2020-05-10 DIAGNOSIS — R079 Chest pain, unspecified: Secondary | ICD-10-CM

## 2020-05-10 LAB — CBC
HCT: 33.6 % — ABNORMAL LOW (ref 36.0–46.0)
Hemoglobin: 11.2 g/dL — ABNORMAL LOW (ref 12.0–15.0)
MCH: 28.6 pg (ref 26.0–34.0)
MCHC: 33.3 g/dL (ref 30.0–36.0)
MCV: 85.9 fL (ref 80.0–100.0)
Platelets: 393 K/uL (ref 150–400)
RBC: 3.91 MIL/uL (ref 3.87–5.11)
RDW: 13.8 % (ref 11.5–15.5)
WBC: 10.6 K/uL — ABNORMAL HIGH (ref 4.0–10.5)
nRBC: 0 % (ref 0.0–0.2)

## 2020-05-10 LAB — BASIC METABOLIC PANEL WITH GFR
Anion gap: 12 (ref 5–15)
BUN: 20 mg/dL (ref 8–23)
CO2: 26 mmol/L (ref 22–32)
Calcium: 9.5 mg/dL (ref 8.9–10.3)
Chloride: 100 mmol/L (ref 98–111)
Creatinine, Ser: 1.35 mg/dL — ABNORMAL HIGH (ref 0.44–1.00)
GFR, Estimated: 40 mL/min — ABNORMAL LOW
Glucose, Bld: 96 mg/dL (ref 70–99)
Potassium: 4 mmol/L (ref 3.5–5.1)
Sodium: 138 mmol/L (ref 135–145)

## 2020-05-10 LAB — NM MYOCAR MULTI W/SPECT W/WALL MOTION / EF
Estimated workload: 1 METS
MPHR: 143 {beats}/min
Peak HR: 97 {beats}/min
Percent HR: 67 %
Rest HR: 75 {beats}/min

## 2020-05-10 MED ORDER — TECHNETIUM TC 99M TETROFOSMIN IV KIT
29.6000 | PACK | Freq: Once | INTRAVENOUS | Status: AC | PRN
  Administered 2020-05-10: 29.6 via INTRAVENOUS

## 2020-05-10 MED ORDER — REGADENOSON 0.4 MG/5ML IV SOLN
INTRAVENOUS | Status: AC
  Filled 2020-05-10: qty 5

## 2020-05-10 MED ORDER — REGADENOSON 0.4 MG/5ML IV SOLN
0.4000 mg | Freq: Once | INTRAVENOUS | Status: AC
  Administered 2020-05-10: 0.4 mg via INTRAVENOUS

## 2020-05-10 MED ORDER — TECHNETIUM TC 99M TETROFOSMIN IV KIT
10.6000 | PACK | Freq: Once | INTRAVENOUS | Status: AC | PRN
  Administered 2020-05-10: 10.6 via INTRAVENOUS

## 2020-05-10 NOTE — Progress Notes (Signed)
D/C instructions given and reviewed. Tele and IV removed, tolerated well. Husband at bedside to transport home. 

## 2020-05-10 NOTE — Discharge Summary (Signed)
Discharge Summary  Karina [name] XAJ:[id number] DOB: May 05, 1940  PCP: Brooke Bonito, MD  Admit date: 05/09/2020 Discharge date: 05/10/2020  Time spent: 35 minutes.  Recommendations for Outpatient Follow-up:  1. Follow-up with your primary care provider in 1 to 2 weeks. 2. Follow-up with your cardiologist in 1 to 2 weeks. 3. Take your medications as prescribed.  Discharge Diagnoses:  Active Hospital Problems   Diagnosis Date Noted  . Paroxysmal atrial fibrillation (HCC)   . Chest pain 05/09/2020  . Atrial fibrillation, chronic (HCC) 05/09/2020  . H/O: CVA (cerebrovascular accident) 05/09/2020  . Hypothyroidism 05/09/2020  . HTN (hypertension) 05/09/2020    Resolved Hospital Problems  No resolved problems to display.    Discharge Condition: Stable.  Diet recommendation: Heart healthy diet.  Vitals:   05/10/20 0340 05/10/20 0718  BP: (!) 178/68 136/72  Pulse: 79 76  Resp: 20 20  Temp: 98 F (36.7 C) 97.6 F (36.4 C)  SpO2: 95% 91%    History of present illness:  Karina [name] is an 80 y.o. female with PMH significant for HTN, H/O CVA, atrial fibrillation on Eliquis, hypothyroidism who was in her usual state of health until 3 days ago when she had onset of substernal chest pressure with radiation to the left side "under my left breast" and  epigastric and left upper quadrant area.  Patient states that the pain would come and go randomly, not associated with any particular movement or exertion.  She may have had some mild shortness of breath with it but is not sure.  Patient is a poor historian.  No nausea, vomiting or diaphoresis associated with it.  She does admit to history of GERD however denies that this feels like reflux.  Patient is status post cholecystectomy.  She denies any exertional component to this chest/epigastric pain.  CT abdomen and pelvis without contrast which revealed no acute or metastatic process in the abdomen or pelvis.  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  Chest x-ray was negative.  EKG was without any acute ST-T wave changes and her troponins were noted to be 5.  Patient was noted have a heart score of 5-6.  Transferred from [facility] ED, she was accepted for admission at [facility] for cardiac work-up.  Seen by cardiology with plan for Lexiscan Myoview on 05/10/2020.  05/10/20: Patient was seen and examined at her bedside.  She denies any chest pain at the time of this visit.  Denies any dyspnea or palpitations.  Vital signs and labs reviewed and are stable.  She will be discharged to home post [facility] if okay with cardiology.   Hospital Course:  Active Problems:   Chest pain   Atrial fibrillation, chronic (HCC)   H/O: CVA (cerebrovascular accident)   Hypothyroidism   HTN (hypertension)   Paroxysmal atrial fibrillation (HCC)  Atypical chest pain rule out ACS. Known history of carotid artery disease. Heart score 4. Troponin negative x2. No evidence of acute ischemia on twelve-lead EKG. She was seen by cardiology with plan for [facility] on 05/10/2020. Symptomatology currently has resolved. Continue home Lipitor 40 mg daily, home fenofibrate 54 mg daily, and home p.o. Lopressor 50 mg twice daily. Management per cardiology.  Paroxysmal A. Fib Admission twelve-lead EKG showed normal sinus rhythm  Rate controlled on p.o. Lopressor. She is on Eliquis for secondary CVA prevention.  Essential hypertension BP stable Continue home regimen She is currently on HCTZ 12.5 mg daily, p.o. Lopressor  50 mg twice daily.  Bilateral carotid artery disease status post bilateral CEA. Keep follow-up appointment with vascular surgery Dr. Cherly Hensen.  Hyperlipidemia Continue home regimen.  Hypothyroidism Continue home levothyroxine 100 mcg daily.  GERD Continue home p.o. Protonix 40 mg twice daily.   Procedures:  Lexiscan Myoview  Consultations:  Cardiology.  Discharge Exam: BP 136/72  (BP Location: Right Arm)   Pulse 76   Temp 97.6 F (36.4 C) (Oral)   Resp 20   Ht 5\' 2"  (1.575 m)   Wt 66 kg   SpO2 91%   BMI 26.63 kg/m  . General: 80 y.o. year-old female well developed well nourished in no acute distress.  Alert and oriented x3. . Cardiovascular: Regular rate and rhythm with no rubs or gallops.  No thyromegaly or JVD noted.   62 Respiratory: Clear to auscultation with no wheezes or rales. Good inspiratory effort. . Abdomen: Soft nontender nondistended with normal bowel sounds x4 quadrants. . Musculoskeletal: No lower extremity edema. 2/4 pulses in all 4 extremities. . Skin: No ulcerative lesions noted or rashes, . Psychiatry: Mood is appropriate for condition and setting  Discharge Instructions You were cared for by a hospitalist during your hospital stay. If you have any questions about your discharge medications or the care you received while you were in the hospital after you are discharged, you can call the unit and asked to speak with the hospitalist on call if the hospitalist that took care of you is not available. Once you are discharged, your primary care physician will handle any further medical issues. Please note that NO REFILLS for any discharge medications will be authorized once you are discharged, as it is imperative that you return to your primary care physician (or establish a relationship with a primary care physician if you do not have one) for your aftercare needs so that they can reassess your need for medications and monitor your lab values.   Allergies as of 05/10/2020      Reactions   Erythromycin    itching   Escitalopram Other (See Comments)   insomnia   Bupropion Itching, Other (See Comments)   insomnia Unknown Unknown insomnia      Medication List    TAKE these medications   apixaban 5 MG Tabs tablet Commonly known as: Eliquis Take 1 tablet (5 mg total) by mouth 2 (two) times daily.   atorvastatin 40 MG tablet Commonly known as:  LIPITOR Take 1 tablet (40 mg total) by mouth daily.   azelastine 0.1 % nasal spray Commonly known as: ASTELIN Place 1-2 sprays into the nose 2 (two) times daily as needed for allergies.   CALCIUM 600 PO Take 1 tablet by mouth in the morning and at bedtime.   chlorpheniramine 4 MG tablet Commonly known as: CHLOR-TRIMETON Take 4 mg by mouth 2 (two) times daily as needed for allergies.   cholecalciferol 25 MCG (1000 UNIT) tablet Commonly known as: VITAMIN D3 Take 1,000 Units by mouth daily.   CINNAMON PO Take 1,000 mg by mouth in the morning and at bedtime.   clobetasol cream 0.05 % Commonly known as: TEMOVATE Apply 1 application topically 2 (two) times daily.   fenofibrate 54 MG tablet Take 1 tablet (54 mg total) by mouth daily.   Garlic 1500 MG Caps Take 1,500 mg by mouth daily.   hydrochlorothiazide 12.5 MG capsule Commonly known as: MICROZIDE Take 12.5 mg by mouth daily.   levothyroxine 100 MCG tablet Commonly known as: SYNTHROID Take 100-150 mcg  by mouth See admin instructions. Taking 1 & 1/2 tab (150mcg ) on Sunday only. All other days 100 mcg daily.   magnesium oxide 400 MG tablet Commonly known as: MAG-OX Take 400 mg by mouth daily.   metoprolol tartrate 50 MG tablet Commonly known as: LOPRESSOR TAKE 1 TABLET TWICE DAILY   MULTIVITAMIN ADULT PO Take 1 tablet by mouth daily.   pantoprazole 40 MG tablet Commonly known as: PROTONIX Take 40 mg by mouth 2 (two) times daily before a meal.   traMADol 50 MG tablet Commonly known as: ULTRAM Take 50 mg by mouth 2 (two) times daily as needed for moderate pain.   vitamin B-12 1000 MCG tablet Commonly known as: CYANOCOBALAMIN Take 1,000 mcg by mouth daily.      Allergies  Allergen Reactions  . Erythromycin     itching  . Escitalopram Other (See Comments)    insomnia  . Bupropion Itching and Other (See Comments)    insomnia Unknown Unknown insomnia     Follow-up Information    Brooke BonitoGallemore, Warren,  MD. Call in 1 day(s).   Specialty: Internal Medicine Why: Please call for a post hospital follow-up appointment. Contact information: [address] [place] [address] [phone]        Roxy Cedarhang, Kevin Z, MD. Call in 1 day(s).   Specialty: Vascular Surgery Why: Please call for a post hospital follow-up appointment. Contact information: [address] [place] [address] [phone]                The results of significant diagnostics from this hospitalization (including imaging, microbiology, ancillary and laboratory) are listed below for reference.    Significant Diagnostic Studies: CT ABDOMEN PELVIS WO CONTRAST  Result Date: 05/09/2020 CLINICAL DATA:  80 year old female with abdominal pain. History of ovarian cancer. EXAM: CT ABDOMEN AND PELVIS WITHOUT CONTRAST TECHNIQUE: Multidetector CT imaging of the abdomen and pelvis was performed following the standard protocol without IV contrast. COMPARISON:  [facility] CT Chest, Abdomen, and Pelvis 04/09/2013 (no images available). [facility] CT Abdomen and Pelvis 02/07/2012. FINDINGS: Lower chest: Stable cardiac size at the upper limits of normal. No pericardial effusion. No superior diaphragmatic or cardiophrenic angle lymphadenopathy now. No pericardial or pleural effusion. Negative lung bases aside from mild atelectasis or scarring. Hepatobiliary: Chronically absent gallbladder. Negative noncontrast liver. Pancreas: Negative. Spleen: Negative. Adrenals/Urinary Tract: Normal adrenal glands. Bilateral nephrolithiasis ranging from punctate to 9 mm in the right kidney. No hydronephrosis. No perinephric stranding. Chronic right renal upper pole cortical scarring is new or increased since 2014. Otherwise stable noncontrast appearance of both kidneys. Ureters are decompressed and negative. Negative bladder. Stomach/Bowel: Redundant large bowel,  although mostly decompressed from the splenic flexure distally. Mild retained stool in the rectum. Mild retained stool in the transverse and right colon. Diminutive or absent appendix. No large bowel inflammation. Negative terminal ileum. No dilated small bowel. Decompressed stomach. 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 This measures about 6 mm diameter (series 2, image 28) and was 5 cm in 2014. No regional inflammation. The remaining duodenum is decompressed. No free air, free fluid, mesenteric stranding. Vascular/Lymphatic: Severe Aortoiliac calcified atherosclerosis. Tortuous abdominal aorta, with infrarenal segment measuring up to 28 mm diameter, increased from 26 mm in 2014. Recommend follow-up ultrasound every 5 years. This recommendation follows ACR consensus guidelines: White Paper of the ACR Incidental Findings Committee II  on Vascular Findings. J Am Coll Radiol 2013; 10:789-794. Vascular patency is not evaluated in the absence of IV contrast. No lymphadenopathy. Reproductive: Surgically absent. No pelvic sidewall lymphadenopathy. Other: No pelvic free fluid. Musculoskeletal: Compression fractures of T11 and L1 are new since 2014 but age indeterminate. Minimal retropulsion. Associated chronic severe T11-T12 disc degeneration with partially calcified disc extrusion. No suspicious osseous lesion identified. IMPRESSION: 1. Age indeterminate compression fractures of T11 and L1, new since 2014. No complicating features. If specific therapy such as vertebroplasty is desired, Lumbar MRI or Nuclear Medicine Whole-body Bone Scan would best determine acuity. 2. Otherwise no acute or metastatic process identified in the abdomen or pelvis. Resolved superior diaphragmatic lymphadenopathy since 2014. 3. 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 This has enlarged from 5 cm in  2014, but there is no regional inflammation. 4. Bilateral nephrolithiasis. No obstructive uropathy. 5. Small infrarenal abdominal aortic aneurysm, increased by 2 mm since 2014. Recommend follow-up ultrasound every 5 years. Aortic Atherosclerosis (ICD10-I70.0). Electronically Signed   By: Odessa Fleming M.D.   On: 05/09/2020 04:23   DG Chest 2 View  Result Date: 05/08/2020 CLINICAL DATA:  Chest pain EXAM: CHEST - 2 VIEW COMPARISON:  08/24/2016 FINDINGS: Left Port-A-Cath remains in place, unchanged. Loop recorder projects over the left chest. Heart is normal size. No confluent airspace opacities or effusions. IMPRESSION: No active cardiopulmonary disease. Electronically Signed   By: Charlett Nose M.D.   On: 05/08/2020 22:20   CUP PACEART REMOTE DEVICE CHECK  Result Date: 05/02/2020 ILR summary report received. Battery status OK. Normal device function. No new symptom, tachy, brady, or pause episodes. No new AF episodes. Monthly summary reports and ROV/PRN. HB   Microbiology: Recent Results (from the past 240 hour(s))  Resp Panel by RT-PCR (Flu A&B, Covid) Nasopharyngeal Swab     Status: None   Collection Time: 05/09/20  6:57 AM   Specimen: Nasopharyngeal Swab; Nasopharyngeal(NP) swabs in vial transport medium  Result Value Ref Range Status   SARS Coronavirus 2 by RT PCR NEGATIVE NEGATIVE Final    Comment: (NOTE) SARS-CoV-2 target nucleic acids are NOT DETECTED.  The SARS-CoV-2 RNA is generally detectable in upper respiratory specimens during the acute phase of infection. The lowest concentration of SARS-CoV-2 viral copies this assay can detect is 138 copies/mL. A negative result does not preclude SARS-Cov-2 infection and should not be used as the sole basis for treatment or other patient management decisions. A negative result may occur with  improper specimen collection/handling, submission of specimen other than nasopharyngeal swab, presence of viral mutation(s) within the areas targeted by this  assay, and inadequate number of viral copies(<138 copies/mL). A negative result must be combined with clinical observations, patient history, and epidemiological information. The expected result is Negative.  Fact Sheet for Patients:  BloggerCourse.com  Fact Sheet for Healthcare Providers:  SeriousBroker.it  This test is no t yet approved or cleared by the [place] FDA and  has been authorized for detection and/or diagnosis of SARS-CoV-2 by FDA under an Emergency Use Authorization (EUA). This EUA will remain  in effect (meaning this test can be used) for the duration of the COVID-19 declaration under Section 564(b)(1) of the Act, 21 U.S.C.section 360bbb-3(b)(1), unless the authorization is terminated  or revoked sooner.       Influenza A by PCR NEGATIVE NEGATIVE Final   Influenza B by PCR NEGATIVE NEGATIVE Final    Comment: (NOTE) The Xpert Xpress  SARS-CoV-2/FLU/RSV plus assay is intended as an aid in the diagnosis of influenza from Nasopharyngeal swab specimens and should not be used as a sole basis for treatment. Nasal washings and aspirates are unacceptable for Xpert Xpress SARS-CoV-2/FLU/RSV testing.  Fact Sheet for Patients: BloggerCourse.com  Fact Sheet for Healthcare Providers: SeriousBroker.it  This test is not yet approved or cleared by the [place] FDA and has been authorized for detection and/or diagnosis of SARS-CoV-2 by FDA under an Emergency Use Authorization (EUA). This EUA will remain in effect (meaning this test can be used) for the duration of the COVID-19 declaration under Section 564(b)(1) of the Act, 21 U.S.C. section 360bbb-3(b)(1), unless the authorization is terminated or revoked.  Performed at [facility], [address] [place], [place] [address]      Labs: Basic Metabolic Panel: Recent Labs  Lab 05/08/20 2126  05/09/20 1610 05/10/20 0333  NA 137 139 138  K 4.1 3.9 4.0  CL 101 102 100  CO2 GLUCOSE 149* 98 96  BUN 26* 21 20  CREATININE 1.64* 1.41* 1.35*  CALCIUM 9.2 9.1 9.5   Liver Function Tests: Recent Labs  Lab 05/08/20 2126 05/09/20 1610  AST 40 32  ALT 19 18  ALKPHOS 61 61  BILITOT 0.3 0.7  PROT 7.5 6.8  ALBUMIN 3.9 3.7   Recent Labs  Lab 05/08/20 2126 05/09/20 1610  LIPASE 35 25   No results for input(s): AMMONIA in the last 168 hours. CBC: Recent Labs  Lab 05/08/20 2126 05/09/20 1610 05/10/20 0333  WBC 12.0* 11.8* 10.6*  NEUTROABS  --  7.4  --   HGB 12.0 11.2* 11.2*  HCT 35.5* 34.1* 33.6*  MCV 86.0 87.4 85.9  PLT 478* 400 393   Cardiac Enzymes: No results for input(s): CKTOTAL, CKMB, CKMBINDEX, TROPONINI in the last 168 hours. BNP: BNP (last 3 results) No results for input(s): BNP in the last 8760 hours.  ProBNP (last 3 results) No results for input(s): PROBNP in the last 8760 hours.  CBG: No results for input(s): GLUCAP in the last 168 hours.     Signed:  Darlin Drop, MD [facility] 05/10/2020, 11:48 AM

## 2020-05-10 NOTE — Progress Notes (Addendum)
Progress Note  Patient Name: Karina [name] Date of Encounter: 05/10/2020  [facility] Cardiologist: Jodi Kappes   Subjective   Mrs. Kiesler was seen yesterday for an episode of atypical chest pain.  She is a very poor historian. She described a pleuritic component to her chest pain.  D-dimer was negative yesterday.  TSH is normal.  She is scheduled for a Lexiscan Myoview today. CP is better today  troponins are negative x 2   Inpatient Medications    Scheduled Meds: . apixaban  5 mg Oral BID  . atorvastatin  40 mg Oral Daily  . fenofibrate  54 mg Oral Daily  . hydrochlorothiazide  12.5 mg Oral Daily  . levothyroxine  100 mcg Oral Daily  . magnesium oxide  400 mg Oral Daily  . metoprolol tartrate  50 mg Oral BID  . pantoprazole  40 mg Oral BID AC   Continuous Infusions:  PRN Meds: acetaminophen, ondansetron (ZOFRAN) IV   Vital Signs    Vitals:   05/09/20 2312 05/10/20 0004 05/10/20 0340 05/10/20 0718  BP: (!) 173/71  (!) 178/68 136/72  Pulse: 81  79 76  Resp: 18  20 20   Temp: 98.2 F (36.8 C)  98 F (36.7 C) 97.6 F (36.4 C)  TempSrc: Oral  Oral Oral  SpO2: 94%  95% 91%  Weight:  66 kg    Height:        Intake/Output Summary (Last 24 hours) at 05/10/2020 0855 Last data filed at 05/10/2020 07/10/2020 Gross per 24 hour  Intake 480 ml  Output 1950 ml  Net -1470 ml   Last 3 Weights 05/10/2020 05/09/2020 05/08/2020  Weight (lbs) 145 lb 9.6 oz 148 lb 5.9 oz 151 lb 14.4 oz  Weight (kg) 66.044 kg 67.3 kg 68.901 kg      Telemetry    NSR  - Personally Reviewed  ECG     nsr  - Personally Reviewed  Physical Exam    GEN: No acute distress.   Flat affect  Neck: No JVD Cardiac: RRR, no murmurs, rubs, or gallops.  Respiratory: Clear to auscultation bilaterally. GI: Soft, nontender, non-distended  MS: No edema; No deformity. Neuro:  Nonfocal  Psych: Normal affect   Labs    High Sensitivity Troponin:   Recent Labs  Lab 05/08/20 2126 05/09/20 0421  TROPONINIHS 5 5       Chemistry Recent Labs  Lab 05/08/20 2126 05/09/20 1610 05/10/20 0333  NA 137 139 138  K 4.1 3.9 4.0  CL 101 102 100  CO2 27 29 26   GLUCOSE 149* 98 96  BUN 26* 21 20  CREATININE 1.64* 1.41* 1.35*  CALCIUM 9.2 9.1 9.5  PROT 7.5 6.8  --   ALBUMIN 3.9 3.7  --   AST 40 32  --   ALT 19 18  --   ALKPHOS 61 61  --   BILITOT 0.3 0.7  --   GFRNONAA 32* 38* 40*  ANIONGAP 9 8 12      Hematology Recent Labs  Lab 05/08/20 2126 05/09/20 1610 05/10/20 0333  WBC 12.0* 11.8* 10.6*  RBC 4.13 3.90 3.91  HGB 12.0 11.2* 11.2*  HCT 35.5* 34.1* 33.6*  MCV 86.0 87.4 85.9  MCH 29.1 28.7 28.6  MCHC 33.8 32.8 33.3  RDW 13.7 13.9 13.8  PLT 478* 400 393    BNPNo results for input(s): BNP, PROBNP in the last 168 hours.   DDimer  Recent Labs  Lab 05/09/20 1733  DDIMER <0.27  Radiology    CT ABDOMEN PELVIS WO CONTRAST  Result Date: 05/09/2020 CLINICAL DATA:  80 year old female with abdominal pain. History of ovarian cancer. EXAM: CT ABDOMEN AND PELVIS WITHOUT CONTRAST TECHNIQUE: Multidetector CT imaging of the abdomen and pelvis was performed following the standard protocol without IV contrast. COMPARISON:  Report of [facility] restaging CT Chest, Abdomen, and Pelvis 04/09/2013 (no images available). [facility] CT Abdomen and Pelvis 02/07/2012. FINDINGS: Lower chest: Stable cardiac size at the upper limits of normal. No pericardial effusion. No superior diaphragmatic or cardiophrenic angle lymphadenopathy now. No pericardial or pleural effusion. Negative lung bases aside from mild atelectasis or scarring. Hepatobiliary: Chronically absent gallbladder. Negative noncontrast liver. Pancreas: Negative. Spleen: Negative. Adrenals/Urinary Tract: Normal adrenal glands. Bilateral nephrolithiasis ranging from punctate to 9 mm in the right kidney. No hydronephrosis. No perinephric stranding. Chronic right renal upper pole cortical scarring is new or  increased since 2014. Otherwise stable noncontrast appearance of both kidneys. Ureters are decompressed and negative. Negative bladder. Stomach/Bowel: Redundant large bowel, although mostly decompressed from the splenic flexure distally. Mild retained stool in the rectum. Mild retained stool in the transverse and right colon. Diminutive or absent appendix. No large bowel inflammation. Negative terminal ileum. No dilated small bowel. Decompressed stomach. 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 This measures about 6 mm diameter (series 2, image 28) and was 5 cm in 2014. No regional inflammation. The remaining duodenum is decompressed. No free air, free fluid, mesenteric stranding. Vascular/Lymphatic: Severe Aortoiliac calcified atherosclerosis. Tortuous abdominal aorta, with infrarenal segment measuring up to 28 mm diameter, increased from 26 mm in 2014. Recommend follow-up ultrasound every 5 years. This recommendation follows ACR consensus guidelines: White Paper of the ACR Incidental Findings Committee II on Vascular Findings. J Am Coll Radiol 2013; 10:789-794. Vascular patency is not evaluated in the absence of IV contrast. No lymphadenopathy. Reproductive: Surgically absent. No pelvic sidewall lymphadenopathy. Other: No pelvic free fluid. Musculoskeletal: Compression fractures of T11 and L1 are new since 2014 but age indeterminate. Minimal retropulsion. Associated chronic severe T11-T12 disc degeneration with partially calcified disc extrusion. No suspicious osseous lesion identified. IMPRESSION: 1. Age indeterminate compression fractures of T11 and L1, new since 2014. No complicating features. If specific therapy such as vertebroplasty is desired, Lumbar MRI or Nuclear Medicine Whole-body Bone Scan would best determine acuity. 2. Otherwise no acute or metastatic process identified in the abdomen or pelvis. Resolved superior diaphragmatic  lymphadenopathy since 2014. 3. 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 This has enlarged from 5 cm in 2014, but there is no regional inflammation. 4. Bilateral nephrolithiasis. No obstructive uropathy. 5. Small infrarenal abdominal aortic aneurysm, increased by 2 mm since 2014. Recommend follow-up ultrasound every 5 years. Aortic Atherosclerosis (ICD10-I70.0). Electronically Signed   By: Odessa Fleming M.D.   On: 05/09/2020 04:23   DG Chest 2 View  Result Date: 05/08/2020 CLINICAL DATA:  Chest pain EXAM: CHEST - 2 VIEW COMPARISON:  08/24/2016 FINDINGS: Left Port-A-Cath remains in place, unchanged. Loop recorder projects over the left chest. Heart is normal size. No confluent airspace opacities or effusions. IMPRESSION: No active cardiopulmonary disease. Electronically Signed   By: Charlett Nose M.D.   On: 05/08/2020 22:20    Cardiac Studies      Patient Profile     80 y.o. female , admitted with atypical cP   Assessment &  Plan    1.  Chest pain :   Very poor historian.  Is difficult to sort out the etiology of this chest pain. D-dimer was negative.  We had scheduled her for a Lexiscan Myoview study.  If the [facility] study is normal then she may be discharged from hospital today and  She will not need cardiology follow-up.  2.  Paroxysmal atrial fib:   Currently in NSR  Cont eliquis , metoprolol       For questions or updates, please contact [facility] Please consult [email or web address] for contact info under        Signed, Kristeen Miss, MD  05/10/2020, 8:55 AM

## 2020-05-10 NOTE — Care Management Obs Status (Signed)
MEDICARE OBSERVATION STATUS NOTIFICATION   Patient Details  Name: Karina [name] MRN: [id number] Date of Birth: [birth year]   Medicare Observation Status Notification Given:  Yes    Leone Haven, RN 05/10/2020, 1:38 PM

## 2020-05-10 NOTE — Progress Notes (Signed)
1 day lexiscan myoview completed without significant complication, pending final result by [facility] reader.

## 2020-05-10 NOTE — Progress Notes (Signed)
I have reviewed the stress test result with the patient and her husband. She is cleared to discharge.

## 2020-05-10 NOTE — Plan of Care (Signed)
  Problem: Education: Goal: Knowledge of General Education information will improve Description: Including pain rating scale, medication(s)/side effects and non-pharmacologic comfort measures Outcome: Adequate for Discharge   Problem: Education: Goal: Knowledge of General Education information will improve Description: Including pain rating scale, medication(s)/side effects and non-pharmacologic comfort measures Outcome: Adequate for Discharge   Problem: Health Behavior/Discharge Planning: Goal: Ability to manage health-related needs will improve Outcome: Adequate for Discharge   Problem: Clinical Measurements: Goal: Ability to maintain clinical measurements within normal limits will improve Outcome: Adequate for Discharge Goal: Will remain free from infection Outcome: Adequate for Discharge Goal: Diagnostic test results will improve Outcome: Adequate for Discharge Goal: Respiratory complications will improve Outcome: Adequate for Discharge Goal: Cardiovascular complication will be avoided Outcome: Adequate for Discharge   

## 2020-05-10 NOTE — Discharge Instructions (Signed)
Chest Wall Pain Chest wall pain is pain in or around the bones and muscles of your chest. Chest wall pain may be caused by:  An injury.  Coughing a lot.  Using your chest and arm muscles too much. Sometimes, the cause may not be known. This pain may take a few weeks or longer to get better. Follow these instructions at home: Managing pain, stiffness, and swelling If told, put ice on the painful area:  Put ice in a plastic bag.  Place a towel between your skin and the bag.  Leave the ice on for 20 minutes, 2-3 times a day.   Activity  Rest as told by your doctor.  Avoid doing things that cause pain. This includes lifting heavy items.  Ask your doctor what activities are safe for you. General instructions  Take over-the-counter and prescription medicines only as told by your doctor.  Do not use any products that contain nicotine or tobacco, such as cigarettes, e-cigarettes, and chewing tobacco. If you need help quitting, ask your doctor.  Keep all follow-up visits as told by your doctor. This is important.   Contact a doctor if:  You have a fever.  Your chest pain gets worse.  You have new symptoms. Get help right away if:  You feel sick to your stomach (nauseous) or you throw up (vomit).  You feel sweaty or light-headed.  You have a cough with mucus from your lungs (sputum) or you cough up blood.  You are short of breath. These symptoms may be an emergency. Do not wait to see if the symptoms will go away. Get medical help right away. Call your local emergency services (911 in the [place] Do not drive yourself to the hospital. Summary  Chest wall pain is pain in or around the bones and muscles of your chest.  It may be treated with ice, rest, and medicines. Your condition may also get better if you avoid doing things that cause pain.  Contact a doctor if you have a fever, chest pain that gets worse, or new symptoms.  Get help right away if you feel light-headed  or you get short of breath. These symptoms may be an emergency. This information is not intended to replace advice given to you by your health care provider. Make sure you discuss any questions you have with your health care provider. Document Revised: 07/24/2017 Document Reviewed: 07/24/2017 Elsevier Patient Education  2021 [employer]   Nonspecific Chest Pain Chest pain can be caused by many different conditions. Some causes of chest pain can be life-threatening. These will require treatment right away. Serious causes of chest pain include:  Heart attack.  A tear in the body's main blood vessel.  Redness and swelling (inflammation) around your heart.  Blood clot in your lungs. Other causes of chest pain may not be so serious. These include:  Heartburn.  Anxiety or stress.  Damage to bones or muscles in your chest.  Lung infections. Chest pain can feel like:  Pain or discomfort in your chest.  Crushing, pressure, aching, or squeezing pain.  Burning or tingling.  Dull or sharp pain that is worse when you move, cough, or take a deep breath.  Pain or discomfort that is also felt in your back, neck, jaw, shoulder, or arm, or pain that spreads to any of these areas. It is hard to know whether your pain is caused by something that is serious or something that is not so serious. So it is   important to see your doctor right away if you have chest pain. Follow these instructions at home: Medicines  Take over-the-counter and prescription medicines only as told by your doctor.  If you were prescribed an antibiotic medicine, take it as told by your doctor. Do not stop taking the antibiotic even if you start to feel better. Lifestyle  Rest as told by your doctor.  Do not use any products that contain nicotine or tobacco, such as cigarettes, e-cigarettes, and chewing tobacco. If you need help quitting, ask your doctor.  Do not drink alcohol.  Make lifestyle changes as told by  your doctor. These may include: ? Getting regular exercise. Ask your doctor what activities are safe for you. ? Eating a heart-healthy diet. A diet and nutrition specialist (dietitian) can help you to learn healthy eating options. ? Staying at a healthy weight. ? Treating diabetes or high blood pressure, if needed. ? Lowering your stress. Activities such as yoga and relaxation techniques can help.   General instructions  Pay attention to any changes in your symptoms. Tell your doctor about them or any new symptoms.  Avoid any activities that cause chest pain.  Keep all follow-up visits as told by your doctor. This is important. You may need more testing if your chest pain does not go away. Contact a doctor if:  Your chest pain does not go away.  You feel depressed.  You have a fever. Get help right away if:  Your chest pain is worse.  You have a cough that gets worse, or you cough up blood.  You have very bad (severe) pain in your belly (abdomen).  You pass out (faint).  You have either of these for no clear reason: ? Sudden chest discomfort. ? Sudden discomfort in your arms, back, neck, or jaw.  You have shortness of breath at any time.  You suddenly start to sweat, or your skin gets clammy.  You feel sick to your stomach (nauseous).  You throw up (vomit).  You suddenly feel lightheaded or dizzy.  You feel very weak or tired.  Your heart starts to beat fast, or it feels like it is skipping beats. These symptoms may be an emergency. Do not wait to see if the symptoms will go away. Get medical help right away. Call your local emergency services (911 in the [place] Do not drive yourself to the hospital. Summary  Chest pain can be caused by many different conditions. The cause may be serious and need treatment right away. If you have chest pain, see your doctor right away.  Follow your doctor's instructions for taking medicines and making lifestyle changes.  Keep all  follow-up visits as told by your doctor. This includes visits for any further testing if your chest pain does not go away.  Be sure to know the signs that show that your condition has become worse. Get help right away if you have these symptoms. This information is not intended to replace advice given to you by your health care provider. Make sure you discuss any questions you have with your health care provider. Document Revised: 07/24/2017 Document Reviewed: 07/24/2017 Elsevier Patient Education  2021 [employer]  

## 2020-05-29 ENCOUNTER — Ambulatory Visit: Payer: Medicare HMO

## 2020-06-06 ENCOUNTER — Ambulatory Visit (INDEPENDENT_AMBULATORY_CARE_PROVIDER_SITE_OTHER): Payer: Medicare HMO

## 2020-06-06 DIAGNOSIS — I639 Cerebral infarction, unspecified: Secondary | ICD-10-CM

## 2020-06-07 LAB — CUP PACEART REMOTE DEVICE CHECK
Date Time Interrogation Session: 20220430214140
Date Time Interrogation Session: 20220430214140
Implantable Pulse Generator Implant Date: 20210806
Implantable Pulse Generator Implant Date: 20210806

## 2020-06-28 NOTE — Progress Notes (Signed)
Carelink Summary Report / Loop Recorder 

## 2020-07-04 ENCOUNTER — Telehealth: Payer: Self-pay

## 2020-07-04 NOTE — Telephone Encounter (Signed)
I let the patient know she has no restrictions with her loop recorder. She can have a mammogram.

## 2020-07-07 ENCOUNTER — Ambulatory Visit (INDEPENDENT_AMBULATORY_CARE_PROVIDER_SITE_OTHER): Payer: Medicare HMO

## 2020-07-07 DIAGNOSIS — I639 Cerebral infarction, unspecified: Secondary | ICD-10-CM | POA: Diagnosis not present

## 2020-07-07 LAB — CUP PACEART REMOTE DEVICE CHECK
Date Time Interrogation Session: 20220602214511
Implantable Pulse Generator Implant Date: 20210806

## 2020-07-25 NOTE — Progress Notes (Signed)
Carelink Summary Report / Loop Recorder 

## 2020-08-09 ENCOUNTER — Ambulatory Visit (INDEPENDENT_AMBULATORY_CARE_PROVIDER_SITE_OTHER): Payer: Medicare HMO

## 2020-08-09 DIAGNOSIS — I639 Cerebral infarction, unspecified: Secondary | ICD-10-CM | POA: Diagnosis not present

## 2020-08-10 LAB — CUP PACEART REMOTE DEVICE CHECK
Date Time Interrogation Session: 20220705214014
Implantable Pulse Generator Implant Date: 20210806

## 2020-08-16 ENCOUNTER — Other Ambulatory Visit (HOSPITAL_COMMUNITY): Payer: Self-pay | Admitting: Nurse Practitioner

## 2020-08-31 NOTE — Progress Notes (Signed)
Carelink Summary Report / Loop Recorder 

## 2020-09-11 ENCOUNTER — Ambulatory Visit (INDEPENDENT_AMBULATORY_CARE_PROVIDER_SITE_OTHER): Payer: Medicare HMO

## 2020-09-11 DIAGNOSIS — I639 Cerebral infarction, unspecified: Secondary | ICD-10-CM

## 2020-09-13 LAB — CUP PACEART REMOTE DEVICE CHECK
Date Time Interrogation Session: 20220807214147
Implantable Pulse Generator Implant Date: 20210806

## 2020-10-03 NOTE — Progress Notes (Signed)
Carelink Summary Report / Loop Recorder 

## 2020-10-12 ENCOUNTER — Ambulatory Visit (INDEPENDENT_AMBULATORY_CARE_PROVIDER_SITE_OTHER): Payer: Medicare HMO

## 2020-10-12 DIAGNOSIS — I639 Cerebral infarction, unspecified: Secondary | ICD-10-CM | POA: Diagnosis not present

## 2020-10-16 LAB — CUP PACEART REMOTE DEVICE CHECK
Date Time Interrogation Session: 20220909214419
Implantable Pulse Generator Implant Date: 20210806

## 2020-10-20 NOTE — Progress Notes (Signed)
Carelink Summary Report / Loop Recorder 

## 2020-11-13 ENCOUNTER — Ambulatory Visit (INDEPENDENT_AMBULATORY_CARE_PROVIDER_SITE_OTHER): Payer: Medicare HMO

## 2020-11-13 DIAGNOSIS — I639 Cerebral infarction, unspecified: Secondary | ICD-10-CM

## 2020-11-21 NOTE — Progress Notes (Signed)
Carelink Summary Report / Loop Recorder 

## 2020-11-23 LAB — CUP PACEART REMOTE DEVICE CHECK
Date Time Interrogation Session: 20221012214548
Implantable Pulse Generator Implant Date: 20210806

## 2020-12-05 ENCOUNTER — Other Ambulatory Visit (HOSPITAL_COMMUNITY): Payer: Self-pay | Admitting: Nurse Practitioner

## 2020-12-05 NOTE — Telephone Encounter (Signed)
Pt last saw Rudi Coco, NO on 01/25/20, last labs Creat 1.52, age 80, weight 66kg, based on specified criteria pt is on appropriate dosage of Eliquis 5mg  BID for afib.  Will refill rx.

## 2020-12-18 ENCOUNTER — Ambulatory Visit (INDEPENDENT_AMBULATORY_CARE_PROVIDER_SITE_OTHER): Payer: Medicare HMO

## 2020-12-18 DIAGNOSIS — I639 Cerebral infarction, unspecified: Secondary | ICD-10-CM

## 2020-12-19 LAB — CUP PACEART REMOTE DEVICE CHECK
Date Time Interrogation Session: 20221114214141
Implantable Pulse Generator Implant Date: 20210806

## 2020-12-25 NOTE — Progress Notes (Signed)
Carelink Summary Report / Loop Recorder 

## 2021-01-22 ENCOUNTER — Ambulatory Visit (INDEPENDENT_AMBULATORY_CARE_PROVIDER_SITE_OTHER): Payer: Medicare HMO

## 2021-01-22 DIAGNOSIS — I639 Cerebral infarction, unspecified: Secondary | ICD-10-CM

## 2021-01-23 LAB — CUP PACEART REMOTE DEVICE CHECK
Date Time Interrogation Session: 20221217214029
Implantable Pulse Generator Implant Date: 20210806

## 2021-01-31 NOTE — Progress Notes (Signed)
Carelink Summary Report / Loop Recorder 

## 2021-02-26 ENCOUNTER — Ambulatory Visit (INDEPENDENT_AMBULATORY_CARE_PROVIDER_SITE_OTHER): Payer: Medicare Other

## 2021-02-26 DIAGNOSIS — I639 Cerebral infarction, unspecified: Secondary | ICD-10-CM | POA: Diagnosis not present

## 2021-02-26 LAB — CUP PACEART REMOTE DEVICE CHECK
Date Time Interrogation Session: 20230122230538
Implantable Pulse Generator Implant Date: 20210806

## 2021-03-08 NOTE — Progress Notes (Signed)
Carelink Summary Report / Loop Recorder 

## 2021-03-31 LAB — CUP PACEART REMOTE DEVICE CHECK
Date Time Interrogation Session: 20230224230537
Implantable Pulse Generator Implant Date: 20210806

## 2021-04-02 ENCOUNTER — Ambulatory Visit (INDEPENDENT_AMBULATORY_CARE_PROVIDER_SITE_OTHER): Payer: Medicare Other

## 2021-04-02 DIAGNOSIS — I639 Cerebral infarction, unspecified: Secondary | ICD-10-CM | POA: Diagnosis not present

## 2021-04-05 NOTE — Progress Notes (Signed)
Carelink Summary Report / Loop Recorder 

## 2021-05-07 ENCOUNTER — Ambulatory Visit (INDEPENDENT_AMBULATORY_CARE_PROVIDER_SITE_OTHER): Payer: Medicare Other

## 2021-05-07 DIAGNOSIS — I639 Cerebral infarction, unspecified: Secondary | ICD-10-CM | POA: Diagnosis not present

## 2021-05-08 LAB — CUP PACEART REMOTE DEVICE CHECK
Date Time Interrogation Session: 20230401020836
Implantable Pulse Generator Implant Date: 20210806

## 2021-05-22 NOTE — Progress Notes (Signed)
Carelink Summary Report / Loop Recorder 

## 2021-06-08 LAB — CUP PACEART REMOTE DEVICE CHECK
Date Time Interrogation Session: 20230503230548
Implantable Pulse Generator Implant Date: 20210806

## 2021-06-11 ENCOUNTER — Ambulatory Visit (INDEPENDENT_AMBULATORY_CARE_PROVIDER_SITE_OTHER): Payer: Medicare Other

## 2021-06-11 DIAGNOSIS — I639 Cerebral infarction, unspecified: Secondary | ICD-10-CM

## 2021-06-26 NOTE — Progress Notes (Signed)
Carelink Summary Report / Loop Recorder 

## 2021-07-16 ENCOUNTER — Ambulatory Visit (INDEPENDENT_AMBULATORY_CARE_PROVIDER_SITE_OTHER): Payer: Medicare Other

## 2021-07-16 DIAGNOSIS — I639 Cerebral infarction, unspecified: Secondary | ICD-10-CM | POA: Diagnosis not present

## 2021-07-16 LAB — CUP PACEART REMOTE DEVICE CHECK
Date Time Interrogation Session: 20230612012011
Implantable Pulse Generator Implant Date: 20210806

## 2021-08-16 LAB — CUP PACEART REMOTE DEVICE CHECK
Date Time Interrogation Session: 20230708230823
Implantable Pulse Generator Implant Date: 20210806

## 2021-08-20 ENCOUNTER — Ambulatory Visit (INDEPENDENT_AMBULATORY_CARE_PROVIDER_SITE_OTHER): Payer: Medicare HMO

## 2021-08-20 DIAGNOSIS — I639 Cerebral infarction, unspecified: Secondary | ICD-10-CM

## 2021-09-17 NOTE — Progress Notes (Signed)
Carelink Summary Report / Loop Recorder 

## 2021-09-24 ENCOUNTER — Ambulatory Visit (INDEPENDENT_AMBULATORY_CARE_PROVIDER_SITE_OTHER): Payer: Medicare Other

## 2021-09-24 DIAGNOSIS — I639 Cerebral infarction, unspecified: Secondary | ICD-10-CM

## 2021-09-26 LAB — CUP PACEART REMOTE DEVICE CHECK
Date Time Interrogation Session: 20230820231209
Implantable Pulse Generator Implant Date: 20210806

## 2021-10-22 NOTE — Progress Notes (Signed)
Carelink Summary Report / Loop Recorder 

## 2021-10-29 ENCOUNTER — Ambulatory Visit (INDEPENDENT_AMBULATORY_CARE_PROVIDER_SITE_OTHER): Payer: Medicare HMO

## 2021-10-29 DIAGNOSIS — I639 Cerebral infarction, unspecified: Secondary | ICD-10-CM

## 2021-10-30 LAB — CUP PACEART REMOTE DEVICE CHECK
Date Time Interrogation Session: 20230922230633
Implantable Pulse Generator Implant Date: 20210806

## 2021-11-16 NOTE — Progress Notes (Signed)
Carelink Summary Report / Loop Recorder 

## 2021-12-03 ENCOUNTER — Ambulatory Visit (INDEPENDENT_AMBULATORY_CARE_PROVIDER_SITE_OTHER): Payer: Medicare Other

## 2021-12-03 DIAGNOSIS — I639 Cerebral infarction, unspecified: Secondary | ICD-10-CM

## 2021-12-03 LAB — CUP PACEART REMOTE DEVICE CHECK
Date Time Interrogation Session: 20231025231316
Implantable Pulse Generator Implant Date: 20210806

## 2022-01-05 NOTE — Progress Notes (Signed)
Carelink Summary Report / Loop Recorder 

## 2022-01-07 ENCOUNTER — Ambulatory Visit (INDEPENDENT_AMBULATORY_CARE_PROVIDER_SITE_OTHER): Payer: Medicare HMO

## 2022-01-07 DIAGNOSIS — I639 Cerebral infarction, unspecified: Secondary | ICD-10-CM

## 2022-01-08 LAB — CUP PACEART REMOTE DEVICE CHECK
Date Time Interrogation Session: 20231203231323
Implantable Pulse Generator Implant Date: 20210806

## 2022-02-11 ENCOUNTER — Ambulatory Visit (INDEPENDENT_AMBULATORY_CARE_PROVIDER_SITE_OTHER): Payer: Medicare Other

## 2022-02-11 DIAGNOSIS — I639 Cerebral infarction, unspecified: Secondary | ICD-10-CM

## 2022-02-12 LAB — CUP PACEART REMOTE DEVICE CHECK
Date Time Interrogation Session: 20240107231727
Implantable Pulse Generator Implant Date: 20210806

## 2022-02-15 NOTE — Progress Notes (Signed)
[facility] Summary Report / Loop Recorder

## 2022-03-14 ENCOUNTER — Encounter (HOSPITAL_COMMUNITY): Payer: Self-pay | Admitting: *Deleted

## 2022-03-18 ENCOUNTER — Ambulatory Visit: Payer: Medicare Other

## 2022-03-18 DIAGNOSIS — I639 Cerebral infarction, unspecified: Secondary | ICD-10-CM

## 2022-03-19 LAB — CUP PACEART REMOTE DEVICE CHECK
Date Time Interrogation Session: 20240209231211
Implantable Pulse Generator Implant Date: 20210806

## 2022-03-26 NOTE — Progress Notes (Signed)
Carelink Summary Report / Loop Recorder 

## 2022-04-22 ENCOUNTER — Ambulatory Visit (INDEPENDENT_AMBULATORY_CARE_PROVIDER_SITE_OTHER): Payer: Medicare Other

## 2022-04-22 DIAGNOSIS — I639 Cerebral infarction, unspecified: Secondary | ICD-10-CM | POA: Diagnosis not present

## 2022-04-24 LAB — CUP PACEART REMOTE DEVICE CHECK
Date Time Interrogation Session: 20240317231606
Implantable Pulse Generator Implant Date: 20210806

## 2022-05-02 NOTE — Progress Notes (Signed)
Carelink Summary Report / Loop Recorder 

## 2022-05-27 ENCOUNTER — Ambulatory Visit (INDEPENDENT_AMBULATORY_CARE_PROVIDER_SITE_OTHER): Payer: Medicare Other

## 2022-05-27 DIAGNOSIS — I639 Cerebral infarction, unspecified: Secondary | ICD-10-CM

## 2022-05-28 LAB — CUP PACEART REMOTE DEVICE CHECK
Date Time Interrogation Session: 20240421231210
Implantable Pulse Generator Implant Date: 20210806

## 2022-06-06 NOTE — Progress Notes (Signed)
Carelink Summary Report / Loop Recorder 

## 2022-07-02 ENCOUNTER — Ambulatory Visit (INDEPENDENT_AMBULATORY_CARE_PROVIDER_SITE_OTHER): Payer: Medicare Other

## 2022-07-02 DIAGNOSIS — I639 Cerebral infarction, unspecified: Secondary | ICD-10-CM | POA: Diagnosis not present

## 2022-07-02 LAB — CUP PACEART REMOTE DEVICE CHECK
Date Time Interrogation Session: 20240524230613
Implantable Pulse Generator Implant Date: 20210806

## 2022-07-03 NOTE — Progress Notes (Signed)
Carelink Summary Report / Loop Recorder 

## 2022-07-26 NOTE — Progress Notes (Signed)
Carelink Summary Report / Loop Recorder 

## 2022-08-01 LAB — CUP PACEART REMOTE DEVICE CHECK
Date Time Interrogation Session: 20240626231044
Implantable Pulse Generator Implant Date: 20210806

## 2022-08-05 ENCOUNTER — Ambulatory Visit (INDEPENDENT_AMBULATORY_CARE_PROVIDER_SITE_OTHER): Payer: Medicare Other

## 2022-08-05 DIAGNOSIS — I639 Cerebral infarction, unspecified: Secondary | ICD-10-CM

## 2022-08-27 NOTE — Progress Notes (Signed)
Carelink Summary Report / Loop Recorder 

## 2022-09-09 ENCOUNTER — Ambulatory Visit (INDEPENDENT_AMBULATORY_CARE_PROVIDER_SITE_OTHER): Payer: Medicare Other

## 2022-09-09 DIAGNOSIS — I639 Cerebral infarction, unspecified: Secondary | ICD-10-CM

## 2022-09-24 NOTE — Progress Notes (Signed)
Carelink Summary Report / Loop Recorder 

## 2022-10-14 ENCOUNTER — Ambulatory Visit (INDEPENDENT_AMBULATORY_CARE_PROVIDER_SITE_OTHER): Payer: Medicare Other

## 2022-10-14 DIAGNOSIS — I639 Cerebral infarction, unspecified: Secondary | ICD-10-CM | POA: Diagnosis not present

## 2022-10-14 LAB — CUP PACEART REMOTE DEVICE CHECK
Date Time Interrogation Session: 20240906230627
Implantable Pulse Generator Implant Date: 20210806

## 2022-10-31 NOTE — Progress Notes (Signed)
Carelink Summary Report / Loop Recorder 

## 2022-11-18 ENCOUNTER — Ambulatory Visit (INDEPENDENT_AMBULATORY_CARE_PROVIDER_SITE_OTHER): Payer: Medicare Other

## 2022-11-18 DIAGNOSIS — I639 Cerebral infarction, unspecified: Secondary | ICD-10-CM

## 2022-11-18 LAB — CUP PACEART REMOTE DEVICE CHECK
Date Time Interrogation Session: 20241013231215
Implantable Pulse Generator Implant Date: 20210806

## 2022-12-05 NOTE — Progress Notes (Signed)
Carelink Summary Report / Loop Recorder 

## 2022-12-23 ENCOUNTER — Ambulatory Visit (INDEPENDENT_AMBULATORY_CARE_PROVIDER_SITE_OTHER): Payer: Medicare Other

## 2022-12-23 DIAGNOSIS — I639 Cerebral infarction, unspecified: Secondary | ICD-10-CM | POA: Diagnosis not present

## 2022-12-24 LAB — CUP PACEART REMOTE DEVICE CHECK
Date Time Interrogation Session: 20241115230045
Implantable Pulse Generator Implant Date: 20210806

## 2023-01-17 NOTE — Addendum Note (Signed)
Addended by: Geralyn Flash D on: 01/17/2023 02:46 PM   Modules accepted: Orders

## 2023-01-17 NOTE — Progress Notes (Signed)
Carelink Summary Report / Loop Recorder 

## 2023-01-27 ENCOUNTER — Ambulatory Visit (INDEPENDENT_AMBULATORY_CARE_PROVIDER_SITE_OTHER): Payer: Medicare Other

## 2023-01-27 DIAGNOSIS — I639 Cerebral infarction, unspecified: Secondary | ICD-10-CM | POA: Diagnosis not present

## 2023-01-28 LAB — CUP PACEART REMOTE DEVICE CHECK
Date Time Interrogation Session: 20241222230050
Implantable Pulse Generator Implant Date: 20210806

## 2023-03-03 ENCOUNTER — Ambulatory Visit (INDEPENDENT_AMBULATORY_CARE_PROVIDER_SITE_OTHER): Payer: Medicare Other

## 2023-03-03 DIAGNOSIS — I639 Cerebral infarction, unspecified: Secondary | ICD-10-CM

## 2023-03-03 LAB — CUP PACEART REMOTE DEVICE CHECK
Date Time Interrogation Session: 20250126230253
Implantable Pulse Generator Implant Date: 20210806

## 2023-03-10 NOTE — Progress Notes (Signed)
 [facility] Summary Report / Loop Recorder

## 2023-03-10 NOTE — Addendum Note (Signed)
Addended by: Geralyn Flash D on: 03/10/2023 12:33 PM   Modules accepted: Orders

## 2023-04-14 NOTE — Progress Notes (Signed)
 [facility] Summary Report / Loop Recorder

## 2023-04-23 NOTE — Progress Notes (Signed)
 Hematology/Oncology Follow-up Visit  [name] Name:  Karina [name] Date of Birth:  December 12, 1940 Date of Encounter:  04/23/2023  Referring Provider:  No ref. provider found,   PCP:  Karina Niels Berth, NP  Diagnoses: 1.  Rising CA125, spring 2023, with PET avid right hilar lymph node noted on staging imaging May 2023.  EBUS FNA 07/19/2021 negative for malignancy.   2.  Serous carcinoma, left ovary, poorly differentiated, FIGO stage IVB. Biopsy proven metastatic disease to left pericardiophrenic lymph node.    3. COPD.    Treatment: 1. Carboplatin AUC 6 IV day 1 plus Paclitaxel 80mg /m2 IV day 1, 8 and 15. Cycle length 21 days. Initiated 05/26/12. Status post 6 cycles. Completed 09/29/12.      Hematologic/Oncologic History: Karina [name] is a 83 y.o. Caucasian female referred by Karina [name] for further workup, evaluation and treatment of biopsy proven metastatic  poorly differentiated carcinoma to a left epigastric lymph node, original date of diagnosis 03/31/12, original date of consultation 04/14/12.    Karina [name] was in Karina [name] usual state of health when Karina [name] went in to see Karina [name] primary care provider, Karina [name], in December of 2013 for a complete physical exam. Part of Karina evaluation at that visit was a urinalysis, which demonstrated 2+ blood by  dipstick with 9-15 WBC per HPF along with 6-15 bacteria per HPF. Culture was negative. In addition, LFTs demonstrated an elevated alkaline phosphatase of 303.    A bilateral screening mammogram was performed on 02/03/12. This demonstrated no mammographic evidence of malignancy. This was read as BI-RADS category 1.    To further evaluate Karina [name] microscopic hematuria, Karina [name] ordered a CT urogram on 02/07/12. This was performed at [facility]. This demonstrated a soft tissue nodule in Karina right lower anterior mediastinum lateral to Karina right heart border measuring  2 x 2-cm. Soft tissue nodule in Karina left lower anterior  mediastinum measured 2.2 x 1.7-cm. Soft tissue nodule adjacent to Karina anterior heart border axial image 4 measured 2 x 1.3-cm. A second nodule measured 2 x 1-cm. Findings were highly suspicious for  pathologic adenopathy. PET/CT scan was recommended. 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 No hydronephrosis or  hydroureter was seen within Karina kidneys. There was a punctate nonobstructive calculus in Karina mid pole of Karina right kidney measuring 2-mm. Unenhanced uterus and adnexa were unremarkable. No small bowel obstruction was seen. No adenopathy was identified.    CT scan of Karina chest was performed on 02/10/12 to evaluate Karina abnormalities. Within Karina mediastinum, there was a borderline enlarged low right paratracheal lymph node measuring 1-cm. Enlarged lymph nodes in Karina anterior mediastinum immediately anterior  to Karina pericardium overlying Karina right ventricle measured 1.1-cm in short axis. Anterolateral to Karina left ventricular apex, there was an additional enlarged mediastinal lymph node measuring 1.5-cm. Karina largest mediastinal lymph node was immediately medial  to Karina inferior aspect of Karina right middle lobe measuring 1.9 x 2.3-cm. Karina heart size was mildly enlarged. No suspicious-appearing pulmonary nodules or masses were seen. There were a few scattered peripheral 2-3-mm pulmonary nodules noted in Karina lungs  bilaterally, which were highly nonspecific but favored to represent areas of mucoid impaction with interminable bronchials. 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 There were no aggressive-appearing lytic or blastic lesions noted in Karina visualized  portions of Karina skeleton.  Karina [name] was referred to pulmonary medicine and was initially seen by Karina [name]. Quanteriferon studies were initially indeterminate. Aspergillus and blastomycetes were negative. ACE level  was normal at 60. ANA was negative. Sed rate was slightly elevated  at 38. Sputum for AFB was negative.    Karina [name] was seen by Karina [name] in urology and underwent cystoscopy which demonstrated no abnormalities. Cytology was negative for malignancy, as well.    Karina [name] elected to pursue further workup with a PET/CT scan. This was performed at [facility] on 03/23/12. This demonstrated a mass in Karina region of Karina left ovary measuring 3.9 x 3.1-cm with a maximum SUV of 6.8. There was a  1.1 x 1.0-cm lymph node in Karina right hilum showing abnormal uptake with a maximum SUV of 2.8. No other abnormal uptake was seen in Karina chest. There were several lymph nodes in Karina anterior upper abdomen. There were several lymph nodes anterior to Karina  dome of Karina liver on Karina right. Karina most lateral of these lymph nodes measured 2.1 x 1.7-cm in size. It had a maximum SUV of 6.9. There were two immediately adjacent lymph nodes at Karina inferior pericardiophrenic angle. One of these lymph nodes measured  2.0 x 1.1-cm in size with Karina immediately adjacent lymph node measuring 1.8 x 1.1-cm in size. These lymph nodes had a maximum SUV of 4.2. There was a lymph node located just to Karina left of Karina anterior aspect of Karina lateral segment left lobe of Karina liver  measuring 2.1 x 1.9-cm. It had a maximum SUV of 8. There was a lymph node in Karina lateral left mid-abdomen, lateral to Karina descending colon measuring 1.1 x 0.7-cm. It had a maximum SUV of 2.8. There were several portal confluent region lymph nodes, Karina  largest measuring 1.5 x 1-cm. This complex had a maximum SUV of 6.7. No other abnormal uptake of radiotracer was seen on Karina study.    On 03/31/12, Karina [name] underwent a biopsy of a left pericardiophrenic lymph node. This demonstrated a metastatic poorly differentiated carcinoma. Immunohistochemical stains were positive for cytokeratin 7 and negative for cytokeratin 20, CK5/6 and TTF-1.  Pathology felt that Karina  possible sites of origin included Karina ovary, breast, gastric, and lung (less likely due to TTF-1 negative staining).   Interim Note:  Sherryann Frese returns today for follow up of history of ovarian cancer.  This is a scheduled follow-up visit.  Karina [name] had surveillance imaging of Karina chest, abdomen and pelvis prior to my visit and is here today to review Karina results.  Overall, Karina [name] feels at baseline.  No abdominal distention or pain.  No cough or shortness of breath.  No early satiety.  ECOG performance status stable and baseline at 2.  Karina [name] brought to my attention today that this morning, when Karina [name] was taking Karina [name] medications, Karina [name] accidentally took Karina [name] husband's medications.  Karina [name] only took 3 of his medications.  These included apixaban , Cardizem CD, and Aricept.  At this time Karina [name] denies any chest pain or palpitations.  No lightheadedness or dizziness.  Karina [name] has not had any easy bruising, epistaxis or gingival bleeding.  Review of Systems: All other systems are negative.  Medications:   Current Outpatient Medications:  .  amLODIPine  (NORVASC ) 2.5 mg tablet, Take 1 tablet (2.5 mg total) by mouth daily. Pt will need an office visit for future refill, Disp: 90 tablet, Rfl: 0 .  apixaban  (ELIQUIS ) 5 mg tab, Take 1 tablet (5  mg total) by mouth 2 times daily., Disp: 180 tablet, Rfl: 0 .  atorvastatin  (LIPITOR) 40 mg tablet, Take 1 tablet (40 mg total) by mouth daily., Disp: 90 tablet, Rfl: 1 .  calcium  carbonate-vitamin D3 600 mg-5 mcg (200 unit) tab, Take 1 tablet by mouth 2 (two) times a day., Disp: , Rfl:  .  chlorpheniramine (CHLOR-TRIMETRON) 4 mg tablet, Take 4 mg by mouth every 6 (six) hours as needed., Disp: , Rfl:  .  cholecalciferol (VITAMIN D3) 2,000 unit cap capsule, Take 2,000 Units by mouth Once Daily., Disp: , Rfl:  .  clobetasoL (TEMOVATE) 0.05 % cream, Apply topically 3 (three) times a week., Disp: 60 g, Rfl: 3 .  clobetasoL (TEMOVATE) 0.05 % scalp solution, Apply  topically 2 (two) times  a day., Disp: 50 mL, Rfl: 11 .  cyanocobalamin (VITAMIN B12) 1,000 mcg tablet, Take 1,000 mcg by mouth Once Daily., Disp: , Rfl:  .  fenofibrate  (LOFIBRA) 54 mg tablet, Take 1 tablet (54 mg total) by mouth daily., Disp: 90 tablet, Rfl: 3 .  hydrOXYzine (ATARAX) 10 mg tablet, Take 1 tablet (10 mg total) by mouth 3 (three) times a day as needed for anxiety., Disp: 90 tablet, Rfl: 0 .  ketoconazole (NIZORAL) 2 % shampoo, Lather into scalp, let sit 2-5 minutes, then rinse out., Disp: 120 mL, Rfl: 11 .  levothyroxine  (SYNTHROID ) 100 mcg tablet, Take 1 tablet (100 mcg total) by mouth every morning., Disp: 98 tablet, Rfl: 1 .  magnesium  oxide 400 mg (241 mg magnesium ) tab, Take 400 mg by mouth Once Daily., Disp: , Rfl:  .  metoprolol  tartrate (LOPRESSOR ) 50 mg tablet, Take 1 tablet by mouth twice daily, Disp: 180 tablet, Rfl: 0 .  mirtazapine (REMERON) 7.5 mg tablet, Take 1 tablet (7.5 mg total) by mouth nightly., Disp: 90 tablet, Rfl: 0 .  pantoprazole  (PROTONIX ) 40 mg EC tablet, Take 1 tablet (40 mg total) by mouth in Karina morning and 1 tablet (40 mg total) in Karina evening. Take before meals., Disp: 180 tablet, Rfl: 1 .  traMADoL  (ULTRAM ) 50 mg tablet, Take 1 tablet (50 mg total) by mouth every 8 (eight) hours as needed for moderate pain (4-6) or severe pain (7-10)., Disp: 30 tablet, Rfl: 3  Past medical history, allergies, current medications, social history and family history were reviewed and updated when appropriate.  Objective:  BP (!) 161/70   Pulse 72   Temp 98.1 F (36.7 C) (Oral)   Resp 16   Wt 67.3 kg (148 lb 6.4 oz)   SpO2 92%   BMI 27.14 kg/m  General: Friendly, well-spoken Caucasian female, no acute distress.  Karina [name] son is with Karina [name] today. HEENT: Normocephalic, atraumatic. PERRLA, sclera anicteric.  Nose without discharge.  No thrush or mucositis noted on oropharyngeal examination. Oral mucosa moist.  Pharynx had no tonsillar enlargement or exudates.  Neck supple without masses. No  thyromegaly present. Trachea midline. Lymphatic/Immunologic:  No cervical, supraclavicular or axillary adenopathy. Cardiovascular:  Regular rate and rhythm. Respiratory:  Chest clear to auscultation bilaterally. No respiratory distress. Karina [name] speaks in full sentences. No accessory muscle use noted. Gastrointestinal: No abdominal distention noted. Musculoskeletal:  No bony pain or tenderness. No tenosynovitis or joint effusions noted. Extremities:  No edema or suspicious rashes.  No cyanosis or clubbing. Skin:  No pathologic appearing petechiae or bruising noted.  Neurologic: Karina [name] is alert and oriented.  Karina [name] walked in with a walker today. Psychiatric:  Mood and affect are normal. Speech is fluent.  Wt Readings from Last 5 Encounters:  04/23/23 67.3 kg (148 lb 6.4 oz)  04/15/23 68.4 kg (150 lb 12.8 oz)  03/21/23 67.1 kg (148 lb)  10/03/22 68.4 kg (150 lb 14.4 oz)  09/06/22 68 kg (150 lb)    Results:   Results for orders placed or performed in visit on 04/15/23  TSH   Collection Time: 04/15/23  4:16 PM  Result Value Ref Range   TSH 1.533 0.450 - 5.330 uIU/mL   Lab Results  Component Value Date   HGB 10.6 (L) 04/01/2023   HGB 10.7 (L) 01/06/2023   HGB 10.7 (L) 09/26/2022   Lab Results  Component Value Date   FOLATE >22.3 01/06/2023   RETIC 0.0558 04/04/2022   FERRITIN 37 01/06/2023   Lab Results  Component Value Date   CA125 100 (H) 04/01/2023   CA125 60 (H) 01/06/2023   CA125 44 (H) 09/26/2022   Radiology:   CT Chest Abdomen Pelvis WO Contrast Narrative: CLINICAL DATA:  Ovarian carcinoma. Rising CA 125 level. Restaging. * Tracking Code: BO *  EXAM: CT CHEST, ABDOMEN AND PELVIS WITHOUT CONTRAST  TECHNIQUE: Multidetector CT imaging of Karina chest, abdomen and pelvis was performed following Karina standard protocol without IV contrast.  RADIATION DOSE REDUCTION: This exam was performed according to Karina departmental dose-optimization program which includes  automated exposure control, adjustment of the mA and/or kV according to [name] size and/or use of iterative reconstruction technique.  COMPARISON:  09/02/2022  FINDINGS: CT CHEST FINDINGS  Cardiovascular: No acute findings.  Mediastinum/Lymph Nodes: No masses or pathologically enlarged lymph nodes identified on this unenhanced exam.  Lungs/Pleura: Mild centrilobular emphysema noted. A few scattered tiny less than 5 mm pulmonary nodules remains stable most likely postinflammatory in etiology no new or enlarging pulmonary nodules or masses identified. No No evidence of pulmonary consolidation. Tiny left pleural effusion versus pleural thickening noted.  Musculoskeletal: No suspicious bone lesions identified. Old fracture deformity of sternum again noted.  CT ABDOMEN AND PELVIS FINDINGS  Hepatobiliary: No masses visualized on this unenhanced exam. Prior cholecystectomy. No evidence of biliary obstruction.  Pancreas: No mass or inflammatory changes identified on this unenhanced exam.  Spleen:  Within normal limits in size.  Adrenals/Urinary Tract: Multiple tiny sub-cm bilateral renal calculi again noted. Mild right renal parenchymal scarring again seen. No evidence of ureteral calculi or hydronephrosis. Unremarkable appearance of bladder.  Stomach/Bowel: No evidence of obstruction, inflammatory process, or abnormal fluid collections. Mild sigmoid diverticulosis noted, without signs of diverticulitis.  Vascular/Lymphatic: No pathologically enlarged lymph nodes identified. Mildly dilated abdominal aorta measuring up to 2.8 cm in diameter.  Reproductive: Prior hysterectomy. Normal appearance of vaginal cuff. No evidence of pelvic mass, peritoneal thickening or nodularity, or ascites.  Other:  None.  Musculoskeletal: No suspicious bone lesions identified. Old compression fractures Karina T11 and L1 vertebral bodies are again seen. Impression: No radiographic evidence of  recurrent or metastatic disease within Karina chest, abdomen, or pelvis.  Bilateral nephrolithiasis. No evidence of ureteral calculi or hydronephrosis.  Mild aortic dilatation measuring 2.8 cm, not meeting criteria for aneurysm. No follow-up imaging is recommended. Reference: Journal of Vascular Surgery 67.1 (2018): 2-77. J Am Coll Radiol [phone].  Electronically Signed   By: Norleen DELENA Kil M.D.   On: 04/21/2023 13:11    Assessment and Plan:  1. FIGO stage IVb serous carcinoma, left ovary, first-line chemotherapy complete 09/29/2012: Despite Karina [name] CA125 of 100, there is no evidence on systemic imaging to support radiographic recurrence of ovarian cancer.  We talked about red flags to watch for including unexplained abdominal distention, early satiety, unexplained nausea, vomiting or abdominal pain.  I told Karina [name] if Karina [name] developed any of Karina symptoms to let me know right away and we would reevaluate.  Otherwise, lab only visit in 3 months to include CA125.  If this is stable, I will see Karina [name] back in 6 months with labs and imaging.  If Karina [name] CA125 in 3 months has significantly risen, I will expedite a PET CT scan.  Karina [name] is agreeable with this plan.   Karina [name] blood pressure and heart rate looked good today.  I told Karina [name] to not take Karina [name] evening dose of Eliquis  today.  I told Karina [name] also to hold Karina [name] evening dose of metoprolol .  I told Karina [name] that Karina [name] could safely resume Eliquis  in Karina morning and resume taking metoprolol  twice daily in Karina interim.  We talked about strategies to prevent Karina [name] from taking Karina [name] husband's medications in Karina future.   In Karina interim, Karina [name] is encouraged to call if questions, concerns, or problems arise.  In total, 30 minutes of time was spent on this encounter, greater than 50% of which was spent face-to-face with Karina [name].  Non-face-to-face activities may include but are not necessarily limited to reviewing medical records, entering orders, reviewing and signing  treatment/chemotherapy orders, reviewing and interpreting imaging studies and labs, as well as medical documentation.  This document was created using Karina aid of voice recognition Dragon dictation software, please excuse any sound alike substitutions, typographic or transcription errors.  Efforts have been made to correct these dictation errors, however some may persist, and this does not reflect Karina standard of medical care.  If there are any questions please do not hesitate to contact me for clarification.

## 2024-01-12 ENCOUNTER — Emergency Department (HOSPITAL_BASED_OUTPATIENT_CLINIC_OR_DEPARTMENT_OTHER)

## 2024-01-12 ENCOUNTER — Other Ambulatory Visit: Payer: Self-pay

## 2024-01-12 ENCOUNTER — Encounter (HOSPITAL_BASED_OUTPATIENT_CLINIC_OR_DEPARTMENT_OTHER): Payer: Self-pay | Admitting: Emergency Medicine

## 2024-01-12 ENCOUNTER — Inpatient Hospital Stay (HOSPITAL_BASED_OUTPATIENT_CLINIC_OR_DEPARTMENT_OTHER): Admission: EM | Admit: 2024-01-12 | Discharge: 2024-01-19 | DRG: 377 | Disposition: A

## 2024-01-12 DIAGNOSIS — I5031 Acute diastolic (congestive) heart failure: Secondary | ICD-10-CM

## 2024-01-12 DIAGNOSIS — K5790 Diverticulosis of intestine, part unspecified, without perforation or abscess without bleeding: Secondary | ICD-10-CM

## 2024-01-12 DIAGNOSIS — E039 Hypothyroidism, unspecified: Secondary | ICD-10-CM | POA: Diagnosis present

## 2024-01-12 DIAGNOSIS — R195 Other fecal abnormalities: Secondary | ICD-10-CM | POA: Diagnosis not present

## 2024-01-12 DIAGNOSIS — I509 Heart failure, unspecified: Secondary | ICD-10-CM | POA: Diagnosis not present

## 2024-01-12 DIAGNOSIS — E785 Hyperlipidemia, unspecified: Secondary | ICD-10-CM | POA: Diagnosis present

## 2024-01-12 DIAGNOSIS — I110 Hypertensive heart disease with heart failure: Secondary | ICD-10-CM | POA: Diagnosis not present

## 2024-01-12 DIAGNOSIS — K5520 Angiodysplasia of colon without hemorrhage: Secondary | ICD-10-CM | POA: Diagnosis not present

## 2024-01-12 DIAGNOSIS — K317 Polyp of stomach and duodenum: Secondary | ICD-10-CM | POA: Diagnosis present

## 2024-01-12 DIAGNOSIS — N179 Acute kidney failure, unspecified: Secondary | ICD-10-CM | POA: Diagnosis present

## 2024-01-12 DIAGNOSIS — Z7901 Long term (current) use of anticoagulants: Secondary | ICD-10-CM | POA: Diagnosis not present

## 2024-01-12 DIAGNOSIS — I130 Hypertensive heart and chronic kidney disease with heart failure and stage 1 through stage 4 chronic kidney disease, or unspecified chronic kidney disease: Secondary | ICD-10-CM | POA: Diagnosis present

## 2024-01-12 DIAGNOSIS — N189 Chronic kidney disease, unspecified: Secondary | ICD-10-CM | POA: Diagnosis present

## 2024-01-12 DIAGNOSIS — D649 Anemia, unspecified: Secondary | ICD-10-CM | POA: Diagnosis present

## 2024-01-12 DIAGNOSIS — D124 Benign neoplasm of descending colon: Secondary | ICD-10-CM | POA: Diagnosis present

## 2024-01-12 DIAGNOSIS — J9601 Acute respiratory failure with hypoxia: Secondary | ICD-10-CM | POA: Diagnosis present

## 2024-01-12 DIAGNOSIS — K635 Polyp of colon: Secondary | ICD-10-CM | POA: Diagnosis not present

## 2024-01-12 DIAGNOSIS — K648 Other hemorrhoids: Secondary | ICD-10-CM | POA: Diagnosis present

## 2024-01-12 DIAGNOSIS — K31819 Angiodysplasia of stomach and duodenum without bleeding: Secondary | ICD-10-CM | POA: Diagnosis not present

## 2024-01-12 DIAGNOSIS — K922 Gastrointestinal hemorrhage, unspecified: Secondary | ICD-10-CM

## 2024-01-12 DIAGNOSIS — I5033 Acute on chronic diastolic (congestive) heart failure: Secondary | ICD-10-CM

## 2024-01-12 DIAGNOSIS — I714 Abdominal aortic aneurysm, without rupture, unspecified: Secondary | ICD-10-CM | POA: Diagnosis present

## 2024-01-12 DIAGNOSIS — D500 Iron deficiency anemia secondary to blood loss (chronic): Secondary | ICD-10-CM | POA: Diagnosis present

## 2024-01-12 DIAGNOSIS — K219 Gastro-esophageal reflux disease without esophagitis: Secondary | ICD-10-CM | POA: Diagnosis present

## 2024-01-12 DIAGNOSIS — I10 Essential (primary) hypertension: Secondary | ICD-10-CM | POA: Diagnosis present

## 2024-01-12 DIAGNOSIS — K5751 Diverticulosis of both small and large intestine without perforation or abscess with bleeding: Secondary | ICD-10-CM | POA: Diagnosis present

## 2024-01-12 DIAGNOSIS — R54 Age-related physical debility: Secondary | ICD-10-CM | POA: Diagnosis present

## 2024-01-12 DIAGNOSIS — K5521 Angiodysplasia of colon with hemorrhage: Secondary | ICD-10-CM | POA: Diagnosis present

## 2024-01-12 DIAGNOSIS — E876 Hypokalemia: Secondary | ICD-10-CM | POA: Diagnosis present

## 2024-01-12 DIAGNOSIS — K5730 Diverticulosis of large intestine without perforation or abscess without bleeding: Secondary | ICD-10-CM | POA: Diagnosis not present

## 2024-01-12 DIAGNOSIS — I5021 Acute systolic (congestive) heart failure: Secondary | ICD-10-CM | POA: Diagnosis not present

## 2024-01-12 DIAGNOSIS — I77819 Aortic ectasia, unspecified site: Secondary | ICD-10-CM

## 2024-01-12 DIAGNOSIS — J439 Emphysema, unspecified: Secondary | ICD-10-CM | POA: Diagnosis present

## 2024-01-12 DIAGNOSIS — I480 Paroxysmal atrial fibrillation: Secondary | ICD-10-CM | POA: Diagnosis present

## 2024-01-12 DIAGNOSIS — K31811 Angiodysplasia of stomach and duodenum with bleeding: Secondary | ICD-10-CM | POA: Diagnosis present

## 2024-01-12 DIAGNOSIS — R7989 Other specified abnormal findings of blood chemistry: Secondary | ICD-10-CM

## 2024-01-12 DIAGNOSIS — K449 Diaphragmatic hernia without obstruction or gangrene: Secondary | ICD-10-CM | POA: Diagnosis present

## 2024-01-12 DIAGNOSIS — R0609 Other forms of dyspnea: Principal | ICD-10-CM

## 2024-01-12 DIAGNOSIS — D125 Benign neoplasm of sigmoid colon: Secondary | ICD-10-CM | POA: Diagnosis present

## 2024-01-12 DIAGNOSIS — D6832 Hemorrhagic disorder due to extrinsic circulating anticoagulants: Secondary | ICD-10-CM | POA: Diagnosis present

## 2024-01-12 DIAGNOSIS — E8770 Fluid overload, unspecified: Secondary | ICD-10-CM | POA: Diagnosis not present

## 2024-01-12 LAB — BASIC METABOLIC PANEL WITH GFR
Anion gap: 10 (ref 5–15)
BUN: 14 mg/dL (ref 8–23)
CO2: 27 mmol/L (ref 22–32)
Calcium: 9.4 mg/dL (ref 8.9–10.3)
Chloride: 105 mmol/L (ref 98–111)
Creatinine, Ser: 1.07 mg/dL — ABNORMAL HIGH (ref 0.44–1.00)
GFR, Estimated: 52 mL/min — ABNORMAL LOW (ref >60)
Glucose, Bld: 105 mg/dL — ABNORMAL HIGH (ref 70–99)
Potassium: 3.8 mmol/L (ref 3.5–5.1)
Sodium: 141 mmol/L (ref 135–145)

## 2024-01-12 LAB — CBC
HCT: 23.1 % — ABNORMAL LOW (ref 36.0–46.0)
Hemoglobin: 7.4 g/dL — ABNORMAL LOW (ref 12.0–15.0)
MCH: 27.2 pg (ref 26.0–34.0)
MCHC: 32 g/dL (ref 30.0–36.0)
MCV: 84.9 fL (ref 80.0–100.0)
Platelets: 438 K/uL — ABNORMAL HIGH (ref 150–400)
RBC: 2.72 MIL/uL — ABNORMAL LOW (ref 3.87–5.11)
RDW: 18.9 % — ABNORMAL HIGH (ref 11.5–15.5)
WBC: 11.8 K/uL — ABNORMAL HIGH (ref 4.0–10.5)
nRBC: 0.2 % (ref 0.0–0.2)

## 2024-01-12 LAB — IRON AND TIBC
Iron: 34 ug/dL (ref 28–170)
Saturation Ratios: 8 % — ABNORMAL LOW (ref 10.4–31.8)
TIBC: 424 ug/dL (ref 250–450)
UIBC: 390 ug/dL

## 2024-01-12 LAB — PRO BRAIN NATRIURETIC PEPTIDE: Pro Brain Natriuretic Peptide: 2539 pg/mL — ABNORMAL HIGH (ref <300.0)

## 2024-01-12 LAB — VITAMIN B12: Vitamin B-12: 663 pg/mL (ref 180–914)

## 2024-01-12 LAB — RETICULOCYTES
Immature Retic Fract: 30.4 % — ABNORMAL HIGH (ref 2.3–15.9)
RBC.: 2.72 MIL/uL — ABNORMAL LOW (ref 3.87–5.11)
Retic Count, Absolute: 82.2 K/uL (ref 19.0–186.0)
Retic Ct Pct: 3 % (ref 0.4–3.1)

## 2024-01-12 LAB — FOLATE: Folate: >20 ng/mL (ref >5.9)

## 2024-01-12 LAB — OCCULT BLOOD X 1 CARD TO LAB, STOOL: Fecal Occult Bld: POSITIVE — AB

## 2024-01-12 LAB — TROPONIN T, HIGH SENSITIVITY: Troponin T High Sensitivity: <15 ng/L (ref 0–19)

## 2024-01-12 LAB — FERRITIN: Ferritin: 32 ng/mL (ref 11–307)

## 2024-01-12 MED ORDER — ONDANSETRON HCL 4 MG/2ML IJ SOLN
4.0000 mg | Freq: Four times a day (QID) | INTRAMUSCULAR | Status: DC | PRN
  Administered 2024-01-12: 4 mg via INTRAVENOUS
  Filled 2024-01-12: qty 2

## 2024-01-12 MED ORDER — ACETAMINOPHEN 650 MG RE SUPP: 650.0000 mg | Freq: Four times a day (QID) | RECTAL | Status: DC | PRN

## 2024-01-12 MED ORDER — PANTOPRAZOLE SODIUM 40 MG PO TBEC
40.0000 mg | DELAYED_RELEASE_TABLET | Freq: Two times a day (BID) | ORAL | Status: DC
  Administered 2024-01-13 – 2024-01-19 (×13): 40 mg via ORAL
  Filled 2024-01-12 (×13): qty 1

## 2024-01-12 MED ORDER — FENOFIBRATE 54 MG PO TABS
54.0000 mg | ORAL_TABLET | Freq: Every day | ORAL | Status: DC
  Administered 2024-01-13 – 2024-01-19 (×5): 54 mg via ORAL
  Filled 2024-01-12 (×7): qty 1

## 2024-01-12 MED ORDER — OXYCODONE HCL 5 MG PO TABS
5.0000 mg | ORAL_TABLET | ORAL | Status: DC | PRN
  Administered 2024-01-12 – 2024-01-18 (×4): 5 mg via ORAL
  Filled 2024-01-12 (×5): qty 1

## 2024-01-12 MED ORDER — POTASSIUM CHLORIDE 20 MEQ PO PACK
40.0000 meq | PACK | Freq: Once | ORAL | Status: AC
  Administered 2024-01-12: 40 meq via ORAL
  Filled 2024-01-12: qty 2

## 2024-01-12 MED ORDER — HYDROCHLOROTHIAZIDE 12.5 MG PO TABS
12.5000 mg | ORAL_TABLET | Freq: Every day | ORAL | Status: DC
  Administered 2024-01-13 – 2024-01-15 (×3): 12.5 mg via ORAL
  Filled 2024-01-12 (×3): qty 1

## 2024-01-12 MED ORDER — METOPROLOL TARTRATE 50 MG PO TABS
50.0000 mg | ORAL_TABLET | Freq: Two times a day (BID) | ORAL | Status: DC
  Administered 2024-01-12 – 2024-01-15 (×6): 50 mg via ORAL
  Filled 2024-01-12 (×6): qty 1

## 2024-01-12 MED ORDER — AMLODIPINE BESYLATE 2.5 MG PO TABS
2.5000 mg | ORAL_TABLET | Freq: Every day | ORAL | Status: DC
  Administered 2024-01-13 – 2024-01-15 (×3): 2.5 mg via ORAL
  Filled 2024-01-12 (×3): qty 1

## 2024-01-12 MED ORDER — HYDRALAZINE HCL 20 MG/ML IJ SOLN: 10.0000 mg | INTRAMUSCULAR | Status: DC | PRN

## 2024-01-12 MED ORDER — ACETAMINOPHEN 325 MG PO TABS
650.0000 mg | ORAL_TABLET | Freq: Four times a day (QID) | ORAL | Status: AC | PRN
  Administered 2024-01-12 – 2024-01-17 (×3): 650 mg via ORAL
  Filled 2024-01-12 (×3): qty 2

## 2024-01-12 MED ORDER — ONDANSETRON HCL 4 MG PO TABS: 4.0000 mg | ORAL_TABLET | Freq: Four times a day (QID) | ORAL | Status: DC | PRN

## 2024-01-12 MED ORDER — HYDROCHLOROTHIAZIDE 12.5 MG PO CAPS: 12.5000 mg | ORAL_CAPSULE | Freq: Every day | ORAL | Status: DC

## 2024-01-12 MED ORDER — LEVOTHYROXINE SODIUM 100 MCG PO TABS
100.0000 ug | ORAL_TABLET | ORAL | Status: DC
  Administered 2024-01-13 – 2024-01-19 (×5): 100 ug via ORAL
  Filled 2024-01-12 (×6): qty 1

## 2024-01-12 MED ORDER — ATORVASTATIN CALCIUM 40 MG PO TABS
40.0000 mg | ORAL_TABLET | Freq: Every day | ORAL | Status: DC
  Administered 2024-01-13 – 2024-01-19 (×7): 40 mg via ORAL
  Filled 2024-01-12 (×7): qty 1

## 2024-01-12 MED ORDER — LEVOTHYROXINE SODIUM 75 MCG PO TABS
150.0000 ug | ORAL_TABLET | ORAL | Status: DC
  Administered 2024-01-18: 150 ug via ORAL
  Filled 2024-01-12: qty 2

## 2024-01-12 MED ORDER — IOHEXOL 300 MG/ML  SOLN
100.0000 mL | Freq: Once | INTRAMUSCULAR | Status: AC | PRN
  Administered 2024-01-12: 100 mL via INTRAVENOUS

## 2024-01-12 MED ORDER — FUROSEMIDE 10 MG/ML IJ SOLN
60.0000 mg | Freq: Once | INTRAMUSCULAR | Status: AC
  Administered 2024-01-12: 60 mg via INTRAVENOUS
  Filled 2024-01-12: qty 6

## 2024-01-12 NOTE — ED Notes (Signed)
 Pt's son, Juliane, was called and updated on pt transfer to [facility].

## 2024-01-12 NOTE — ED Notes (Signed)
 Called [facility] for consult to Hosptitalist @17 :12.  Spoke with Nataya

## 2024-01-12 NOTE — ED Triage Notes (Signed)
 Pt c/o shob, cough, weakness x 3 days. Denies known fever, known sick contact. Decreased po intake.   O2 noted to be 85- 89% on RA while in triage. RT to triage to assess.  Hx of copd.   Also c/o R elbow and arm pain. Fall appx 3 weeks ago.

## 2024-01-12 NOTE — Telephone Encounter (Signed)
 Patients son called in to inquire if he would be able to get lab testing done today. Patient was seen in UC and adivsed to go to the ER to get labs drawn because they would come back quicker but they do not want to sit in ER and wanted to know if PCP could order labs so they can be drawn. PSC spoke with Clotilda and advised son to have provider at urgent care place lab orders so they can be drawn at the lab.   He stated he would check back with urgent care

## 2024-01-12 NOTE — H&P (Signed)
 History and Physical    Karina [name] FMW:[id number] DOB: Nov 16, 1940 DOA: 01/12/2024  PCP: Marshall Lowers, MD   Chief Complaint:  sob  HPI: Karina [name] is a 83 y.o. female with medical history significant of GERD, hypertension who presented to the emergency department due to shortness of breath and weakness.  Patient denying symptoms for the last several days.  Went to an urgent care and was told to go to the ER due to hypoxia.  On arrival to the emergency department he was afebrile and hemodynamically stable.  Labs were obtained and present which showed creatinine at baseline, WBC 10.8, hemoglobin 7.4 baseline around 11, BNP 2539, troponin less than 15.  Patient had chest x-ray which demonstrated bibasilar chest densities suggestive of pulmonary venous congestion.  CT chest abdomen pelvis was obtained which showed volume overload, dilated abdominal aorta.  Patient was transferred to [facility] for GI consultation and further management of volume overload.  On admission she was resting comfortably.  She reported to have swelling in her legs and orthopnea.    Review of Systems: Review of Systems  Constitutional: Negative.  Negative for chills and fever.  HENT: Negative.    Eyes: Negative.   Respiratory:  Positive for shortness of breath.   Cardiovascular:  Positive for orthopnea and leg swelling.  Gastrointestinal: Negative.   Genitourinary: Negative.   Musculoskeletal: Negative.   Skin: Negative.   Neurological: Negative.   Endo/Heme/Allergies: Negative.   Psychiatric/Behavioral: Negative.       As per HPI otherwise 10 point review of systems negative.   Allergies  Allergen Reactions   Erythromycin     itching   Escitalopram Other (See Comments)    insomnia   Bupropion Itching and Other (See Comments)    insomnia Unknown Unknown insomnia     Past Medical History:  Diagnosis Date   GERD (gastroesophageal reflux disease)    Hypertension    Thyroid  disease     Past  Surgical History:  Procedure Laterality Date   ABDOMINAL HYSTERECTOMY     APPENDECTOMY     CHOLECYSTECTOMY     EYE SURGERY     LOOP RECORDER INSERTION N/A 09/10/2019   Procedure: LOOP RECORDER INSERTION;  Surgeon: Inocencio Soyla Lunger, MD;  Location: [facility] IN[facility]ASIVE [facility];  Service: Cardiovascular;  Laterality: N/A;     reports that she has quit smoking. She has never used smokeless tobacco. She reports that she does not drink alcohol and does not use drugs.  Family History  Problem Relation Age of Onset   Stroke Maternal Grandfather     Prior to Admission medications   Medication Sig Start Date End Date Taking? Authorizing Provider  amLODipine  (NORVASC ) 2.5 MG tablet Take 2.5 mg by mouth daily. 02/28/22  Yes [provider]  apixaban  (ELIQUIS ) 5 MG TABS tablet Take 1 tablet (5 mg total) by mouth 2 (two) times daily. 12/05/20  Yes Camnitz, Will Lunger, MD  atorvastatin  (LIPITOR) 40 MG tablet Take 1 tablet (40 mg total) by mouth daily. 09/11/19  Yes Drusilla Sabas RAMAN, MD  Calcium  Carbonate (CALCIUM  600 PO) Take 1 tablet by mouth in the morning and at bedtime.   Yes [provider]  chlorpheniramine (CHLOR-TRIMETON) 4 MG tablet Take 4 mg by mouth 2 (two) times daily as needed for allergies.   Yes [provider]  clobetasol cream (TEMOVATE) 0.05 % Apply 1 application topically 2 (two) times daily. 09/02/19  Yes [provider]  fenofibrate  54 MG tablet Take 1 tablet (  54 mg total) by mouth daily. 09/11/19  Yes Drusilla Sabas RAMAN, MD  hydrochlorothiazide  (MICROZIDE ) 12.5 MG capsule Take 12.5 mg by mouth daily.   Yes [provider]  hydrOXYzine (ATARAX) 10 MG tablet Take 10 mg by mouth 3 (three) times daily as needed for anxiety. 09/19/21  Yes [provider]  levothyroxine  (SYNTHROID ) 100 MCG tablet Take 100-150 mcg by mouth See admin instructions. Taking 1 & 1/2 tab ( ) on Sunday only. All other days 100 mcg daily. 09/22/19  Yes [provider]   magnesium  oxide (MAG-OX) 400 MG tablet Take 400 mg by mouth daily.   Yes [provider]  metoprolol  tartrate (LOPRESSOR ) 50 MG tablet Take 1 tablet (50 mg total) by mouth 2 (two) times daily. Appointment Required For Further Refills [phone] 08/16/20  Yes Dow Arland BROCKS, NP  Multiple Vitamins-Minerals (PRESERVISION AREDS 2 PO) Take 1 tablet by mouth 2 (two) times daily.   Yes [provider]  pantoprazole  (PROTONIX ) 40 MG tablet Take 40 mg by mouth 2 (two) times daily before a meal.  06/18/16  Yes [provider]  traMADol  (ULTRAM ) 50 MG tablet Take 50 mg by mouth 2 (two) times daily as needed for moderate pain.  05/25/19  Yes [provider]    Physical Exam: Vitals:   01/12/24 1504 01/12/24 1715 01/12/24 1800 01/12/24 1925  BP: (!) 184/57 (!) 122/95 110/71 (!) 156/69  Pulse: 70 71 66 66  Resp: 18 18    Temp: 97.7 F (36.5 C) 98.3 F (36.8 C)    TempSrc: Oral Oral    SpO2: 93% 96% 95% 100%  Weight:      Height:       Physical Exam Constitutional:      Appearance: She is normal weight.  HENT:     Head: Normocephalic.     Mouth/Throat:     Mouth: Mucous membranes are moist.     Pharynx: Oropharynx is clear.  Eyes:     Pupils: Pupils are equal, round, and reactive to light.  Cardiovascular:     Rate and Rhythm: Normal rate and regular rhythm.  Pulmonary:     Effort: Pulmonary effort is normal.     Breath sounds: Normal breath sounds.  Abdominal:     General: Bowel sounds are normal.     Palpations: Abdomen is soft.  Musculoskeletal:        General: Normal range of motion.     Cervical back: Normal range of motion.  Skin:    General: Skin is warm.     Capillary Refill: Capillary refill takes less than 2 seconds.  Neurological:     General: No focal deficit present.     Mental Status: She is alert.  Psychiatric:        Mood and Affect: Mood normal.        Labs on Admission: I have personally reviewed the patients's labs and  imaging studies.  Assessment/Plan Principal Problem:   Symptomatic anemia   # Acute on chronic anemia - Patient denies overt bleeding - Hemoglobin of 7 from baseline around 11 - Patient endorsing shortness of breath consistent with symptomatic anemia  Plan: Check hemoglobin in morning Anemia workup ordered GI consultation for possible endoscopic evaluation  # Shortness of breath most likely secondary to heart failure exacerbation - No recent echocardiogram in chart - Patient had echo 2021 which showed EF 60%  Plan: Repeat echocardiogram IV diuresis  # Hypertension-continue amlodipine , hydrochlorothiazide , metoprolol   # GERD-continue  Protonix   # Hypothyroidism-continue levothyroxine   # Hyperlipidemia-continue fenofibrate , Lipitor   Admission status: Inpatient Telemetry  Certification: The appropriate patient status for this patient is INPATIENT. Inpatient status is judged to be reasonable and necessary in order to provide the required intensity of service to ensure the patient's safety. The patient's presenting symptoms, physical exam findings, and initial radiographic and laboratory data in the context of their chronic comorbidities is felt to place them at high risk for further clinical deterioration. Furthermore, it is not anticipated that the patient will be medically stable for discharge from the hospital within 2 midnights of admission.   * I certify that at the point of admission it is my clinical judgment that the patient will require inpatient hospital care spanning beyond 2 midnights from the point of admission due to high intensity of service, high risk for further deterioration and high frequency of surveillance required.DEWAINE Lamar Dess MD [facility] If 7PM-7AM, please contact night-coverage [email or web address]  01/12/2024, 9:20 PM

## 2024-01-12 NOTE — Progress Notes (Signed)
 HPI:   Chief Complaint  Patient presents with  . Nausea    Started 3-4 days ago but seems to be getting worse. Taking mucinex for sxs.   . Fatigue  . Shortness of Breath  . Cough  . Vomiting    HPI Karina [name] is a 83 y.o. female  Patient in with her son for evaluation today of progressing shortness of breath and fatigue.  She reports her symptoms started 3 to 4 days ago and since that time her activity levels have decreased and at times she has some shortness of breath that she is not able to carry on a conversation.  She reports an intermittent cough but states that this is not a consistent symptom.  She did have some episodes of vomiting over the weekend but this is resolved at this time.  Patient is noted to be pale during exam, when asked about this son reports that she does appear slightly more pale than her baseline.  Medical History[1]  Current Medications[2]   ROS:    Review of Systems  Constitutional:  Positive for fatigue.  HENT: Negative.    Eyes: Negative.   Respiratory:  Positive for shortness of breath.   Cardiovascular: Negative.   Gastrointestinal:  Positive for nausea and vomiting.  Musculoskeletal: Negative.   Skin: Negative.   Neurological:  Positive for weakness.  All other systems reviewed and are negative.    OBJECTIVE:    Vitals:   01/12/24 1016  BP: (!) 169/66  Pulse: 77  Resp: 18  Temp: 98.4 F (36.9 C)  TempSrc: Oral  SpO2: 90%     Physical Exam Vitals and nursing note reviewed.  Constitutional:      General: She is not in acute distress.    Appearance: She is ill-appearing.  HENT:     Head: Normocephalic.     Mouth/Throat:     Mouth: Mucous membranes are moist.  Eyes:     Pupils: Pupils are equal, round, and reactive to light.  Cardiovascular:     Rate and Rhythm: Normal rate and regular rhythm.     Pulses: Normal pulses.     Heart sounds: Normal heart sounds.  Pulmonary:     Effort: Pulmonary effort is normal. No  respiratory distress.     Breath sounds: Normal breath sounds.  Musculoskeletal:        General: Normal range of motion.     Cervical back: Normal range of motion.  Skin:    General: Skin is warm and dry.     Capillary Refill: Capillary refill takes less than 2 seconds.     Coloration: Skin is pale.  Neurological:     General: No focal deficit present.     Mental Status: She is alert. Mental status is at baseline.     Motor: Weakness present.  Psychiatric:        Mood and Affect: Mood normal.      ASSESSMENT/ PLAN:   1. Shortness of breath      2. Cough, unspecified type  POCT COVID BD   POC Rapid Influenza A & B Antigen    3. Elevated blood pressure reading with diagnosis of hypertension      4. Fatigue, unspecified type         Results for orders placed or performed in visit on 01/12/24  POCT COVID BD  Result Value Ref Range   COVID Antigen, POC Negative Negative   Internal Control Acceptable    Kit/Device  Lot # [id number]    Kit/Device Expiration Date 2232026   POC Rapid Influenza A & B Antigen  Result Value Ref Range   Flu A Negative Negative   Flu B Negative Negative   Internal Control Acceptable    Kit/Device Lot # [id number]    Kit/Device Expiration Date 1687972     Discussed all point of care testing, images obtained in clinic, medical decision making, and follow up with patient and/or family prior to discharge.    Assessment/Plan Patient reporting progressing shortness of breath and fatigue over the last several days, lung sounds are clear on exam which increased concern.  Patient is noted to be pale, has a history of anemia but has not needed transfusions in the past.  She reports shortness of breath is so extensive that at times she is not able to carry on normal conversation and her activity has become very limited.  She reports an occasional cough but this not consistent symptom which increases concerned that her symptoms are not viral or respiratory illness  related.  Discussed exam findings with patient and her son who is with her and recommended that they proceed to the emergency department for further evaluation including stat lab work and chest x-ray.  Risks of obtaining these test in clinic have been discussed and include delayed testing and delayed treatment as we often do not receive these test results back the same day, which is why this workup is not appropriate in this setting.  They vocalized understanding of recommendations.   Patient was seen in the clinic and deemed to have an acute or chronic illness that poses a threat to life or bodily function, including a risk of death. As such I recommend patient proceed to the ED for further evaluation and management at a higher level of care than what I can provide in an Urgent Care setting.   There are no Patient Instructions on file for this visit.  Documentation may have been partially / fully completed with the assistance of [employer] AI software. Every effort has been made to ensure accuracy of the record.   I agree the documentation is accurate and complete.  Electronically signed by: Damien Rosella, AGNP 01/12/2024 10:43 AM        [1] Past Medical History: Diagnosis Date  . Epiretinal membrane (ERM), bilateral   . Hyperlipidemia   . Hypertension   . Macular degeneration   . Skin cancer of face 12/2023  . Stroke    (CMD)   [2]  Current Outpatient Medications:  .  amLODIPine  (NORVASC ) 2.5 mg tablet, Take 1 tablet (2.5 mg total) by mouth daily. Pt will need an office visit for future refill, Disp: 90 tablet, Rfl: 3 .  apixaban  (ELIQUIS ) 5 mg tab, Take 1 tablet (5 mg total) by mouth 2 times daily., Disp: 180 tablet, Rfl: 3 .  atorvastatin  (LIPITOR) 40 mg tablet, Take 1 tablet (40 mg total) by mouth daily., Disp: 90 tablet, Rfl: 1 .  calcium  carbonate-vitamin D3 600 mg-5 mcg (200 unit) tab, Take 1 tablet by mouth 2 (two) times a day., Disp: , Rfl:  .  chlorpheniramine  (CHLOR-TRIMETRON) 4 mg tablet, Take 4 mg by mouth every 6 (six) hours as needed., Disp: , Rfl:  .  cholecalciferol (VITAMIN D3) 2,000 unit cap capsule, Take 2,000 Units by mouth Once Daily., Disp: , Rfl:  .  clobetasoL (TEMOVATE) 0.05 % cream, Apply topically 3 (three) times a week., Disp: 60 g, Rfl: 3 .  clobetasoL (TEMOVATE) 0.05 % scalp solution, Apply  topically 2 (two) times a day., Disp: 50 mL, Rfl: 11 .  cyanocobalamin (VITAMIN B12) 1,000 mcg tablet, Take 1,000 mcg by mouth Once Daily., Disp: , Rfl:  .  fenofibrate  (LOFIBRA) 54 mg tablet, Take 1 tablet (54 mg total) by mouth daily., Disp: 90 tablet, Rfl: 3 .  hydrOXYzine (ATARAX) 10 mg tablet, Take 1 tablet (10 mg total) by mouth 3 (three) times a day as needed for anxiety., Disp: 90 tablet, Rfl: 3 .  ketoconazole (NIZORAL) 2 % shampoo, Lather into scalp, let sit 2-5 minutes, then rinse out., Disp: 120 mL, Rfl: 11 .  levothyroxine  (SYNTHROID ) 100 mcg tablet, Take 1 tablet (100 mcg total) by mouth every morning. AND take an extra ONE HALF tablet on Sundays, Disp: 98 tablet, Rfl: 1 .  magnesium  oxide 400 mg (241 mg magnesium ) tab, Take 400 mg by mouth Once Daily., Disp: , Rfl:  .  metoprolol  tartrate (LOPRESSOR ) 50 mg tablet, Take 1 tablet by mouth twice daily, Disp: 180 tablet, Rfl: 3 .  mirtazapine (REMERON) 7.5 mg tablet, Take 1 tablet (7.5 mg total) by mouth nightly., Disp: 90 tablet, Rfl: 0 .  pantoprazole  (PROTONIX ) 40 mg EC tablet, Take 1 tablet (40 mg total) by mouth in the morning and 1 tablet (40 mg total) in the evening. Take before meals., Disp: 180 tablet, Rfl: 1 .  traMADoL  (ULTRAM ) 50 mg tablet, Take 1 tablet (50 mg total) by mouth every 8 (eight) hours as needed for moderate pain (4-6) or severe pain (7-10)., Disp: 30 tablet, Rfl: 3

## 2024-01-12 NOTE — Plan of Care (Signed)
 Patient acceptd from [facility]. Hx of afib on eliquis . Presenting with symptomatic anemia and volume overload. Patient baseline hgb 11, presenting with hgb of 7. Will need GI consult, Echocardiogram. Accepted to [facility].

## 2024-01-12 NOTE — ED Provider Notes (Signed)
 [facility] Provider Note   CSN: [id number] Arrival date & time: 01/12/24  1237     Patient presents with: Shortness of Breath and Weakness   Karina [name] is a 83 y.o. female.   Pt with c/o non prod cough, nasal congestion/rhinorrhea, sob, in the past few days. No specific known ill contacts or known covid or flu exposure. No fever/chills. Hx copd, but does not use inhalers aor required tx for same. Remote hx smoking. No hx cad or chf. Went to urgent care and had covid and flu test negative and was told to go to ER as room air o2 sats low, mid to upper 80s. .  Pt notes some chest discomfort/tightness in past few days, at rest (pt vague in description of chest discomfort - no constant and/or pleuritic chest pain, no exertional chest pain, ?related to cough). No leg pain or swelling. Is on eliquis . Remote hx ovarian ca.   The history is provided by the patient, medical records and a relative.  Shortness of Breath Associated symptoms: cough and vomiting   Associated symptoms: no abdominal pain, no diaphoresis, no fever, no headaches, no neck pain, no rash and no sore throat   Weakness Associated symptoms: cough, nausea, shortness of breath and vomiting   Associated symptoms: no abdominal pain, no dysuria, no fever and no headaches        Prior to Admission medications   Medication Sig Start Date End Date Taking? Authorizing Provider  amLODipine  (NORVASC ) 2.5 MG tablet Take 2.5 mg by mouth daily. 02/28/22  Yes [provider]  hydrOXYzine (ATARAX) 10 MG tablet Take 10 mg by mouth 3 (three) times daily as needed for anxiety. 09/19/21  Yes [provider]  apixaban  (ELIQUIS ) 5 MG TABS tablet Take 1 tablet (5 mg total) by mouth 2 (two) times daily. 12/05/20   Camnitz, Soyla Lunger, MD  atorvastatin  (LIPITOR) 40 MG tablet Take 1 tablet (40 mg total) by mouth daily. 09/11/19   Drusilla Sabas RAMAN, MD  azelastine (ASTELIN) 0.1 % nasal spray  Place 1-2 sprays into the nose 2 (two) times daily as needed for allergies. 03/20/20   [provider]  Calcium  Carbonate (CALCIUM  600 PO) Take 1 tablet by mouth in the morning and at bedtime.    [provider]  chlorpheniramine (CHLOR-TRIMETON) 4 MG tablet Take 4 mg by mouth 2 (two) times daily as needed for allergies.    [provider]  cholecalciferol (VITAMIN D3) 25 MCG (1000 UNIT) tablet Take 1,000 Units by mouth daily.    [provider]  CINNAMON PO Take 1,000 mg by mouth in the morning and at bedtime.    [provider]  clobetasol cream (TEMOVATE) 0.05 % Apply 1 application topically 2 (two) times daily. 09/02/19   [provider]  fenofibrate  54 MG tablet Take 1 tablet (54 mg total) by mouth daily. 09/11/19   Drusilla Sabas RAMAN, MD  Garlic 1500 MG CAPS Take 1,500 mg by mouth daily.    [provider]  hydrochlorothiazide  (MICROZIDE ) 12.5 MG capsule Take 12.5 mg by mouth daily.    [provider]  levothyroxine  (SYNTHROID ) 100 MCG tablet Take 100-150 mcg by mouth See admin instructions. Taking 1 & 1/2 tab ( ) on Sunday only. All other days 100 mcg daily. 09/22/19   [provider]  magnesium  oxide (MAG-OX) 400 MG tablet Take 400 mg by mouth daily.    [provider]  metoprolol  tartrate (  LOPRESSOR ) 50 MG tablet Take 1 tablet (50 mg total) by mouth 2 (two) times daily. Appointment Required For Further Refills [phone] 08/16/20   Carroll, Donna C, NP  Multiple Vitamin (MULTIVITAMIN ADULT PO) Take 1 tablet by mouth daily.    [provider]  pantoprazole  (PROTONIX ) 40 MG tablet Take 40 mg by mouth 2 (two) times daily before a meal.  06/18/16   [provider]  traMADol  (ULTRAM ) 50 MG tablet Take 50 mg by mouth 2 (two) times daily as needed for moderate pain.  05/25/19   [provider]  vitamin B-12 (CYANOCOBALAMIN) 1000 MCG tablet Take 1,000 mcg by mouth daily.    [provider]    Allergies: Erythromycin, Escitalopram, and Bupropion    Review of Systems  Constitutional:  Negative for chills, diaphoresis and fever.  HENT:  Positive for rhinorrhea. Negative for sore throat.   Respiratory:  Positive for cough and shortness of breath.   Cardiovascular:  Negative for palpitations and leg swelling.  Gastrointestinal:  Positive for nausea and vomiting. Negative for abdominal pain.  Genitourinary:  Negative for dysuria and flank pain.  Musculoskeletal:  Negative for back pain and neck pain.  Skin:  Negative for rash.  Neurological:  Positive for weakness. Negative for headaches.    Updated Vital Signs BP (!) 176/64 (BP Location: Left Arm)   Pulse 62   Temp 98.1 F (36.7 C) (Oral)   Resp 18   Ht 1.575 m (5' 2)   Wt 69.9 kg   SpO2 99%   BMI 28.17 kg/m   Physical Exam Vitals and nursing note reviewed.  Constitutional:      Appearance: Normal appearance. She is well-developed.  HENT:     Head: Atraumatic.     Nose: Congestion present.     Mouth/Throat:     Mouth: Mucous membranes are moist.     Pharynx: Oropharynx is clear. No oropharyngeal exudate or posterior oropharyngeal erythema.  Eyes:     General: No scleral icterus.    Conjunctiva/sclera: Conjunctivae normal.     Pupils: Pupils are equal, round, and reactive to light.  Neck:     Trachea: No tracheal deviation.     Comments: No stiffness or rigidity.  Cardiovascular:     Rate and Rhythm: Normal rate and regular rhythm.     Pulses: Normal pulses.     Heart sounds: Normal heart sounds. No murmur heard.    No friction rub. No gallop.  Pulmonary:     Effort: Pulmonary effort is normal. No respiratory distress.     Comments: ?slight wheeze. Abdominal:     General: Bowel sounds are normal. There is no distension.     Palpations: Abdomen is soft.     Tenderness: There is no abdominal tenderness.  Genitourinary:    Comments: No cva tenderness.  Rectal (daughter in  room/chaperone), v dark brown stool, sent for hemoccult. No mass felt.  Musculoskeletal:        General: No swelling or tenderness.     Cervical back: Normal range of motion and neck supple. No rigidity. No muscular tenderness.     Right lower leg: No edema.     Left lower leg: No edema.  Lymphadenopathy:     Cervical: No cervical adenopathy.  Skin:    General: Skin is warm and dry.     Findings: No rash.  Neurological:     Mental Status: She is alert.     Comments: Alert, speech normal.  Psychiatric:        Mood and Affect: Mood normal.     (all labs ordered are listed, but only abnormal results are displayed) Results for orders placed or performed during the hospital encounter of 01/12/24  Basic metabolic panel   Collection Time: 01/12/24  1:44 PM  Result Value Ref Range   Sodium 141 135 - 145 mmol/L   Potassium 3.8 3.5 - 5.1 mmol/L   Chloride 105 98 - 111 mmol/L   CO2 27 22 - 32 mmol/L   Glucose, Bld 105 (H) 70 - 99 mg/dL   BUN 14 8 - 23 mg/dL   Creatinine, Ser 8.92 (H) 0.44 - 1.00 mg/dL   Calcium  9.4 8.9 - 10.3 mg/dL   GFR, Estimated 52 (L) >60 mL/min   Anion gap 10 5 - 15  CBC   Collection Time: 01/12/24  1:44 PM  Result Value Ref Range   WBC 11.8 (H) 4.0 - 10.5 K/uL   RBC 2.72 (L) 3.87 - 5.11 MIL/uL   Hemoglobin 7.4 (L) 12.0 - 15.0 g/dL   HCT 76.8 (L) 63.9 - 53.9 %   MCV 84.9 80.0 - 100.0 fL   MCH 27.2 26.0 - 34.0 pg   MCHC 32.0 30.0 - 36.0 g/dL   RDW 81.0 (H) 88.4 - 84.4 %   Platelets 438 (H) 150 - 400 K/uL   nRBC 0.2 0.0 - 0.2 %  Pro Brain natriuretic peptide   Collection Time: 01/12/24  1:44 PM  Result Value Ref Range   Pro Brain Natriuretic Peptide 2,539.0 (H) <300.0 pg/mL  Troponin T, High Sensitivity   Collection Time: 01/12/24  1:44 PM  Result Value Ref Range   Troponin T High Sensitivity <15 0 - 19 ng/L       EKG: EKG Interpretation Date/Time:  Monday January 12 2024 12:50:53 EST Ventricular Rate:  72 PR Interval:  54 QRS  Duration:  92 QT Interval:  437 QTC Calculation: 479 R Axis:   75  Text Interpretation: Sinus rhythm Non-specific ST-t changes Baseline wander Confirmed by Bernard Drivers ([id number]) on 01/12/2024 1:30:17 PM  Radiology: [facility] Chest 2 View Result Date: 01/12/2024 CLINICAL DATA:  Shortness of breath. EXAM: CHEST - 2 VIEW COMPARISON:  Chest CT 04/04/2023 and chest radiograph 05/08/2020 FINDINGS: Left subclavian Port-A-Cath is stable with the tip at the superior cavoatrial junction. Heart size is probably enlarged. Bibasilar chest densities, left side larger than right. Findings are suggestive for pleural effusions. Lung markings are slightly enlarged and there may be some vascular congestion. Trachea is midline. Old compression fractures at T11 and L1. IMPRESSION: 1. Bibasilar chest densities, left side greater than right. Findings are suggestive for pleural effusions. 2. Cardiomegaly with possible vascular congestion. Electronically Signed   By: Juliene Balder M.D.   On: 01/12/2024 14:27     Procedures   Medications Ordered in the ED - No data to display                                  Medical Decision Making Problems Addressed: Acute diastolic CHF (congestive heart failure) (HCC): acute illness or injury with systemic symptoms that poses a threat to life or bodily functions Acute respiratory failure with hypoxia (HCC): acute illness or injury with systemic symptoms that poses a threat to life or bodily functions Dyspnea on exertion: acute illness or injury Symptomatic anemia: acute illness or injury with systemic symptoms that poses a threat  to life or bodily functions    Details: R/o gi bleeding  Amount and/or Complexity of Data Reviewed Independent Historian:     Details: Family, hx External Data Reviewed: notes. Labs: ordered. Decision-making details documented in ED Course. Radiology: ordered and independent interpretation performed. Decision-making details documented in ED  Course. ECG/medicine tests: ordered and independent interpretation performed. Decision-making details documented in ED Course. Discussion of management or test interpretation with external provider(s): medicine  Risk Prescription drug management. Decision regarding hospitalization.   Iv ns. Continuous pulse ox and cardiac monitoring. Labs ordered/sent. Imaging ordered.   Differential diagnosis includes pna, covid, flu, copd, etc. Dispo decision including potential need for admission considered - will get labs and imaging and reassess.   Reviewed nursing notes and prior charts for additional history. External reports reviewed. Additional history from: family.   Cardiac monitor: sinus rhythm, rate 66.  Labs reviewed/interpreted by me - chem largely unremarkable. Hgb 7.4, lower than prior. On review old charts it appears hgb has trended down over time, but now much lower. Pt does indicate stools have been somewhat dark for 'awhile'.  Denies hx gi bleeding. Stool sent for hemoccult. Anemia panel added.  Bnp elevated. ?elev bnp/chf/effusions related to symptomatic anemia.   Xrays reviewed/interpreted by me - vascular congestion ?effusion.    Hx ovarian ca. Last ca 125 mildly increased. Will get ct chest, abd, pelvis.   Medicine consulted for admission. Discussed hospital preference w pt/family - they indicate if bed available at [facility] hospital, prefer there, but if no bed available, ok with transfer/admit to [facility].   1551, call backs pending - signed out to Dr Ginger to facilitate admission (including that as hgb 7, chf, etc, pt not able to stay at free-standing ED for extended period, will need to transfer to accepting ED if inpatient bed assignment is delayed).   CRITICAL CARE RE: acute respiratory failure with hypoxia, symptomatic anemia/gib with chf.  Performed by: Yichen Gilardi E Kayleena Eke Total critical care time: 40 minutes Critical care time was exclusive of separately billable procedures  and treating other patients. Critical care was necessary to treat or prevent imminent or life-threatening deterioration. Critical care was time spent personally by me on the following activities: development of treatment plan with patient and/or surrogate as well as nursing, discussions with consultants, evaluation of patient's response to treatment, examination of patient, obtaining history from patient or surrogate, ordering and performing treatments and interventions, ordering and review of laboratory studies, ordering and review of radiographic studies, pulse oximetry and re-evaluation of patient's condition.       Final diagnoses:  None    ED Discharge Orders     None          Bernard Drivers, MD 01/12/24 1553

## 2024-01-12 NOTE — ED Notes (Signed)
 Called [facility] for transport to [facility] @18 :49.  Spoke with Luke

## 2024-01-12 NOTE — ED Provider Notes (Signed)
 Care assumed from Dr. Bernard.  At time of transfer of care, patient is awaiting callback from [facility] regional see if they have a bed for the patient who has suspected GI bleed on Eliquis  with symptomatic anemia and possible new heart failure with elevated BNP.  Family reportedly preferred to go to [facility] regional so they will be called after her imaging and workup was completed to discuss position but if there is not an available bed, patient will likely need to be admitted to [facility] and will also likely need ED transfer so we can start blood transfusion that we do not have.  The facility has the patient is not acutely and extremis at this time.  5:07 PM CT scan returned and does show fluid overload with frank pulmonary edema and effusions.  It also shows an infrarenal abdominal aortic dilation with some possible ulceration but may benefit from outpatient vascular consultation.  She also has diverticulosis which may be the cause of this rectal bleeding and her fecal occult was positive.  Had a shared decision-making conversation and family is amenable to admission to [facility] instead of [facility] and due to this new heart failure with an elevated BNP, she may need cardiology consultation as well so they would prefer [facility] for admission.  Will also discussed possible ED to ED transfer so that she can get blood transfusion started for symptomatic anemia and new hypoxia while she waits for her admission bed.   Clinical Impression: 1. Dyspnea on exertion   2. Acute respiratory failure with hypoxia (HCC)   3. Acute diastolic CHF (congestive heart failure) (HCC)   4. Symptomatic anemia   5. Dilation of aorta   6. Diverticulosis   7. Gastrointestinal hemorrhage, unspecified gastrointestinal hemorrhage type   8. Elevated brain natriuretic peptide (BNP) level     Disposition: Admit  This note was prepared with assistance of Dragon voice recognition software. Occasional wrong-word or  sound-a-like substitutions may have occurred due to the inherent limitations of voice recognition software.     Dudley Cooley, Lonni PARAS, MD 01/12/24 2328

## 2024-01-13 ENCOUNTER — Inpatient Hospital Stay (HOSPITAL_COMMUNITY)

## 2024-01-13 DIAGNOSIS — I509 Heart failure, unspecified: Secondary | ICD-10-CM

## 2024-01-13 DIAGNOSIS — R195 Other fecal abnormalities: Secondary | ICD-10-CM

## 2024-01-13 DIAGNOSIS — I5031 Acute diastolic (congestive) heart failure: Secondary | ICD-10-CM

## 2024-01-13 DIAGNOSIS — I5033 Acute on chronic diastolic (congestive) heart failure: Secondary | ICD-10-CM

## 2024-01-13 DIAGNOSIS — K922 Gastrointestinal hemorrhage, unspecified: Secondary | ICD-10-CM

## 2024-01-13 DIAGNOSIS — J9601 Acute respiratory failure with hypoxia: Secondary | ICD-10-CM

## 2024-01-13 DIAGNOSIS — E8770 Fluid overload, unspecified: Secondary | ICD-10-CM

## 2024-01-13 DIAGNOSIS — Z7901 Long term (current) use of anticoagulants: Secondary | ICD-10-CM

## 2024-01-13 LAB — CBC WITH DIFFERENTIAL/PLATELET
Abs Immature Granulocytes: 0.08 K/uL — ABNORMAL HIGH (ref 0.00–0.07)
Basophils Absolute: 0.1 K/uL (ref 0.0–0.1)
Basophils Relative: 1 %
Eosinophils Absolute: 0.4 K/uL (ref 0.0–0.5)
Eosinophils Relative: 4 %
HCT: 24.4 % — ABNORMAL LOW (ref 36.0–46.0)
Hemoglobin: 7.8 g/dL — ABNORMAL LOW (ref 12.0–15.0)
Immature Granulocytes: 1 %
Lymphocytes Relative: 13 %
Lymphs Abs: 1.5 K/uL (ref 0.7–4.0)
MCH: 27.7 pg (ref 26.0–34.0)
MCHC: 32 g/dL (ref 30.0–36.0)
MCV: 86.5 fL (ref 80.0–100.0)
Monocytes Absolute: 1.1 K/uL — ABNORMAL HIGH (ref 0.1–1.0)
Monocytes Relative: 10 %
Neutro Abs: 7.8 K/uL — ABNORMAL HIGH (ref 1.7–7.7)
Neutrophils Relative %: 71 %
Platelets: 452 K/uL — ABNORMAL HIGH (ref 150–400)
RBC: 2.82 MIL/uL — ABNORMAL LOW (ref 3.87–5.11)
RDW: 19 % — ABNORMAL HIGH (ref 11.5–15.5)
WBC: 10.9 K/uL — ABNORMAL HIGH (ref 4.0–10.5)
nRBC: 0 % (ref 0.0–0.2)

## 2024-01-13 LAB — COMPREHENSIVE METABOLIC PANEL WITH GFR
ALT: 13 U/L (ref 0–44)
AST: 28 U/L (ref 15–41)
Albumin: 3.3 g/dL — ABNORMAL LOW (ref 3.5–5.0)
Alkaline Phosphatase: 80 U/L (ref 38–126)
Anion gap: 9 (ref 5–15)
BUN: 14 mg/dL (ref 8–23)
CO2: 27 mmol/L (ref 22–32)
Calcium: 8.5 mg/dL — ABNORMAL LOW (ref 8.9–10.3)
Chloride: 105 mmol/L (ref 98–111)
Creatinine, Ser: 1.26 mg/dL — ABNORMAL HIGH (ref 0.44–1.00)
GFR, Estimated: 43 mL/min — ABNORMAL LOW (ref >60)
Glucose, Bld: 82 mg/dL (ref 70–99)
Potassium: 4.4 mmol/L (ref 3.5–5.1)
Sodium: 141 mmol/L (ref 135–145)
Total Bilirubin: 1.1 mg/dL (ref 0.0–1.2)
Total Protein: 6.7 g/dL (ref 6.5–8.1)

## 2024-01-13 LAB — CBC
HCT: 22.7 % — ABNORMAL LOW (ref 36.0–46.0)
Hemoglobin: 7.2 g/dL — ABNORMAL LOW (ref 12.0–15.0)
MCH: 27.2 pg (ref 26.0–34.0)
MCHC: 31.7 g/dL (ref 30.0–36.0)
MCV: 85.7 fL (ref 80.0–100.0)
Platelets: 429 K/uL — ABNORMAL HIGH (ref 150–400)
RBC: 2.65 MIL/uL — ABNORMAL LOW (ref 3.87–5.11)
RDW: 18.9 % — ABNORMAL HIGH (ref 11.5–15.5)
WBC: 11.6 K/uL — ABNORMAL HIGH (ref 4.0–10.5)
nRBC: 0 % (ref 0.0–0.2)

## 2024-01-13 LAB — ECHOCARDIOGRAM COMPLETE
Area-P 1/2: 4.15 cm2
Calc EF: 62.8 %
Height: 62 in
S' Lateral: 2.6 cm
Single Plane A2C EF: 62.3 %
Single Plane A4C EF: 60 %
Weight: 2412.71 [oz_av]

## 2024-01-13 LAB — MAGNESIUM: Magnesium: 2 mg/dL (ref 1.7–2.4)

## 2024-01-13 LAB — PROTIME-INR
INR: 1.6 — ABNORMAL HIGH (ref 0.8–1.2)
Prothrombin Time: 19.9 s — ABNORMAL HIGH (ref 11.4–15.2)

## 2024-01-13 MED ORDER — FUROSEMIDE 10 MG/ML IJ SOLN
60.0000 mg | Freq: Two times a day (BID) | INTRAMUSCULAR | Status: DC
  Administered 2024-01-13 (×2): 60 mg via INTRAVENOUS
  Filled 2024-01-13 (×2): qty 6

## 2024-01-13 MED ORDER — FUROSEMIDE 10 MG/ML IJ SOLN
60.0000 mg | Freq: Once | INTRAMUSCULAR | Status: AC
  Administered 2024-01-13: 60 mg via INTRAVENOUS
  Filled 2024-01-13: qty 6

## 2024-01-13 NOTE — Plan of Care (Signed)
   Problem: Health Behavior/Discharge Planning: Goal: Ability to manage health-related needs will improve Outcome: Progressing

## 2024-01-13 NOTE — TOC CM/SW Note (Signed)
 Transition of Care [facility]) - Inpatient Brief Assessment   Patient Details  Name: Kynnedy Carreno MRN: [id number] Date of Birth: [birth year]  Transition of Care [facility]) CM/SW Contact:    Waddell Barnie Rama, RN Phone Number: 01/13/2024, 11:35 AM   Clinical Narrative: From home with spouse, has PCP and insurance on file, states has no HH services in place at this time , has walker/cane and bsc at home.  States family member (son)  will transport them home at [employer] and family is support system, states gets medications from [facility].  Pta self ambulatory with walker/cane.   There are no ICM needs identified  at this time.  Please place consult for ICM  needs.     Transition of Care Asessment: Insurance and Status: Insurance coverage has been reviewed Patient has primary care physician: Yes Home environment has been reviewed: home with spouse Prior level of function:: ambulatory with walker/cane Prior/Current Home Services: Current home services (walker/cane, bsc) Social Drivers of Health Review: SDOH reviewed no interventions necessary Readmission risk has been reviewed: Yes Transition of care needs: no transition of care needs at this time

## 2024-01-13 NOTE — Progress Notes (Signed)
 Attending and consulting MD's, patient and son would like you to call son and update them, Allegra Cerniglia [phone]

## 2024-01-13 NOTE — Hospital Course (Signed)
 83 y.o. female with medical history significant of GERD, hypertension who presented to the emergency department due to shortness of breath and weakness.  Found to have anemia as well as CHF.   Assessment and Plan:   Acute chronic anemia with concern for GI bleed - Hemoglobin 7 on presentation, previous baseline around 11.  Occult stool positive.  GI consulted and following closely.  Possible endoscopic evaluation.  Currently on PPI twice daily.   Acute exacerbation of HFpEF - Previous echo 2021 showed EF 60%.  Noted dyspneic, edematous.  Chest x-ray personally reviewed noting congestion.  Elevated proBNP.  Echo pending.  Received 1 dose IV Lasix .  Showing great response, -2900 mL so far.  Will continue IV Lasix  60 mg twice daily.  Monitor urine output.   Hypertension - Continue metoprolol , HCTZ, amlodipine .   GERD - Protonix  as above.   Hypothyroidism - Levothyroxine  on board.   Physical debilitation muscle weakness - Will order PT/OT.

## 2024-01-13 NOTE — Progress Notes (Addendum)
 Progress Note   Patient: Karina [name] FMW:[id number] DOB: 1940/07/05 DOA: 01/12/2024  DOS: the patient was seen and examined on 01/13/2024   Brief hospital course:  83 y.o. female with medical history significant of GERD, hypertension who presented to the emergency department due to shortness of breath and weakness.  Found to have anemia as well as CHF.  Assessment and Plan:  Acute chronic anemia with concern for GI bleed - Hemoglobin 7 on presentation, previous baseline around 11.  Occult stool positive.  GI consulted and following closely.  Possible endoscopic evaluation.  Currently on PPI twice daily.  Acute exacerbation of HFpEF - Previous echo 2021 showed EF 60%.  Noted dyspneic, edematous.  Chest x-ray personally reviewed noting congestion.  Elevated proBNP.  Echo pending.  Received 1 dose IV Lasix .  Showing great response, -2900 mL so far.  Will continue IV Lasix  60 mg twice daily.  Monitor urine output.  Hypertension - Continue metoprolol , HCTZ, amlodipine .  GERD - Protonix  as above.  Hypothyroidism - Levothyroxine  on board.  Physical debilitation muscle weakness - Will order PT/OT.  Subjective: Patient resting comfortably this morning.  States she feels improved from yesterday.  Urine output excellent.  Denies any worsening fever, chills, chest pain, shortness of breath, nausea, vomiting, abdominal pain.  States she feels hungry.  No bleeding, dark stools noted.  Physical Exam:  Vitals:   01/13/24 0400 01/13/24 0500 01/13/24 0600 01/13/24 0700  BP:  (!) 143/77 (!) 148/51 (!) 107/45  Pulse: 70 75 69 72  Resp: 11 15 11 13   Temp:      TempSrc:      SpO2: 100% 100% 100% 100%  Weight:      Height:        GENERAL:  Alert, pleasant, no acute distress  HEENT:  EOMI CARDIOVASCULAR:  RRR, no murmurs appreciated RESPIRATORY: Poor air movement bilaterally GASTROINTESTINAL:  Soft, nontender, nondistended EXTREMITIES: Trace LE edema bilaterally NEURO:  No new focal deficits  appreciated SKIN:  No rashes noted PSYCH:  Appropriate mood and affect     Data Reviewed:  Imaging Studies: CT CHEST ABDOMEN PELVIS W CONTRAST Result Date: 01/12/2024 CLINICAL DATA:  Remote history of ovarian cancer, generalized weakness, anemia, shortness of breath EXAM: CT CHEST, ABDOMEN, AND PELVIS WITH CONTRAST TECHNIQUE: Multidetector CT imaging of the chest, abdomen and pelvis was performed following the standard protocol during bolus administration of intravenous contrast. RADIATION DOSE REDUCTION: This exam was performed according to the departmental dose-optimization program which includes automated exposure control, adjustment of the mA and/or kV according to patient size and/or use of iterative reconstruction technique. CONTRAST:  OMNIPAQUE  IOHEXOL  300 MG/ML  SOLN COMPARISON:  04/04/2023, 09/02/2022, 05/09/2020 FINDINGS: CT CHEST FINDINGS Cardiovascular: The heart is unremarkable without pericardial effusion. Stable calcifications of the mitral annulus. No evidence of thoracic aortic aneurysm or dissection. Stable atherosclerosis of the aorta and coronary vasculature. Mediastinum/Nodes: Enlarged precarinal lymph node measuring 15 mm in short axis reference image 20/2, previously 13 mm. No other pathologic adenopathy elsewhere within the mediastinal, hilar, or axillary regions. Trachea and esophagus are unremarkable. Lungs/Pleura: There are small free-flowing bilateral pleural effusions. Scattered areas of atelectasis are seen within the bilateral lower lobes, obscuring the subpleural left lower lobe nodule seen on 09/02/2022 staging exam. Stable emphysema. Upper lobe predominant interlobular septal thickening and mild ground-glass airspace disease. No pneumothorax. The central airways are patent. Musculoskeletal: There are no acute or destructive bony abnormalities. Reconstructed images demonstrate no additional findings. CT ABDOMEN PELVIS FINDINGS Hepatobiliary: No  focal liver abnormality  is seen. Status post cholecystectomy. No biliary dilatation. Pancreas: Unremarkable. No pancreatic ductal dilatation or surrounding inflammatory changes. Spleen: Normal in size without focal abnormality. Adrenals/Urinary Tract: Stable small calcifications of the bilateral renal hila, likely a combination of vascular calcifications and punctate nonobstructing calculi. There is no obstructive uropathy within either kidney. Chronic areas of renal cortical scarring are seen, right greater than left. The adrenals and bladder are unremarkable. Stomach/Bowel: No bowel obstruction or ileus. Prior appendectomy. Scattered sigmoid diverticulosis without diverticulitis. 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 No bowel wall thickening. Vascular/Lymphatic: Stable dilation of the infrarenal abdominal aortic aneurysm measuring up to 2.8 cm. There is focal ulcerative plaque within this region as well, with no evidence of aortic leak or rupture. Diffuse atherosclerosis throughout the iliac and femoral arteries with multifocal high-grade stenoses noted. No pathologic adenopathy. Reproductive: Previous hysterectomy.  No adnexal masses. Other: No free fluid or free intraperitoneal gas. No evidence of peritoneal nodularity or omental thickening. No abdominal wall hernia. Musculoskeletal: No acute or destructive bony abnormalities. Reconstructed images demonstrate chronic compression deformities at T11 and L1, stable. IMPRESSION: 1. Upper lobe predominant interlobular septal thickening, ground-glass opacities, and bilateral pleural effusions, favoring volume overload and developing pulmonary edema. 2. No evidence of intrathoracic, intra-abdominal, or intrapelvic metastases. 3. Mild dilation of the infrarenal abdominal aorta measuring 2.8 cm, with focal ulcerative atheromatous plaque within the dilated aorta as above. No frank aneurysm. Consultation with vascular surgery could be  considered for continued monitoring of the ulcerative plaque. 4. Sigmoid diverticulosis without diverticulitis. 5. Aortic Atherosclerosis (ICD10-I70.0) and Emphysema (ICD10-J43.9). Electronically Signed   By: Ozell Daring M.D.   On: 01/12/2024 16:05   DG Chest 2 View Result Date: 01/12/2024 CLINICAL DATA:  Shortness of breath. EXAM: CHEST - 2 VIEW COMPARISON:  Chest CT 04/04/2023 and chest radiograph 05/08/2020 FINDINGS: Left subclavian Port-A-Cath is stable with the tip at the superior cavoatrial junction. Heart size is probably enlarged. Bibasilar chest densities, left side larger than right. Findings are suggestive for pleural effusions. Lung markings are slightly enlarged and there may be some vascular congestion. Trachea is midline. Old compression fractures at T11 and L1. IMPRESSION: 1. Bibasilar chest densities, left side greater than right. Findings are suggestive for pleural effusions. 2. Cardiomegaly with possible vascular congestion. Electronically Signed   By: Juliene Balder M.D.   On: 01/12/2024 14:27    Results are pending, will review when available.  Previous records (including but not limited to H&P, progress notes, nursing notes, TOC management) were reviewed in assessment of this patient.  Labs: CBC: Recent Labs  Lab 01/12/24 1344 01/13/24 0245 01/13/24 0832  WBC 11.8* 11.6* 10.9*  NEUTROABS  --   --  7.8*  HGB 7.4* 7.2* 7.8*  HCT 23.1* 22.7* 24.4*  MCV 84.9 85.7 86.5  PLT 438* 429* 452*   Basic Metabolic Panel: Recent Labs  Lab 01/12/24 1344 01/13/24 0245  NA 141 141  K 3.8 4.4  CL 105 105  CO2 27 27  GLUCOSE 105* 82  BUN 14 14  CREATININE 1.07* 1.26*  CALCIUM  9.4 8.5*  MG  --  2.0   Liver Function Tests: Recent Labs  Lab 01/13/24 0245  AST 28  ALT 13  ALKPHOS 80  BILITOT 1.1  PROT 6.7  ALBUMIN 3.3*   CBG: No results for input(s): GLUCAP in the last 168 hours.  Scheduled Meds:  amLODipine   2.5 mg Oral Daily   atorvastatin   40 mg Oral Daily    fenofibrate   54 mg Oral Daily   hydrochlorothiazide   12.5 mg Oral Daily   levothyroxine   100 mcg Oral Once per day on Monday Tuesday Wednesday Thursday Friday Saturday   [START ON 01/18/2024] levothyroxine   150 mcg Oral Q Sun   metoprolol  tartrate  50 mg Oral BID   pantoprazole   40 mg Oral BID   Continuous Infusions: PRN Meds:.acetaminophen  **OR** acetaminophen , hydrALAZINE , ondansetron  **OR** ondansetron  (ZOFRAN ) IV, oxyCODONE   Family Communication: Not at bedside  Disposition: Status is: Inpatient Remains inpatient appropriate because: CHF, anemia     Time spent: 40 minutes  Length of inpatient stay: 1 days  Author: Carliss LELON Canales, DO 01/13/2024 11:50 AM  For on call review [email or web address].

## 2024-01-13 NOTE — Progress Notes (Signed)
 Heart Failure Navigator Progress Note  Assessed for Heart & Vascular TOC clinic readiness.  Patient does not meet criteria due to she is seen by [facility]. No HF TOC. .   Navigator will sign off at this time.   Stephane Haddock, BSN, Scientist, Clinical (histocompatibility And Immunogenetics) Only

## 2024-01-13 NOTE — Progress Notes (Signed)
  Echocardiogram 2D Echocardiogram has been performed.  Koleen KANDICE Popper, RDCS 01/13/2024, 9:52 AM

## 2024-01-13 NOTE — Consult Note (Signed)
 Consultation Note   Referring Provider:   [facility] PCP: Minta Jon BROCKS, NP Primary Gastroenterologist:   [place]      Reason for Consultation: Symptomatic anemia DOA: 01/12/2024         Hospital Day: 2   ASSESSMENT    83 year old female with a history of CHF with preserved EF admitted with shortness of breath in setting of volume overload as worsening of chronic anemia  Acute on chronic anemia ( approaching iron deficiency level) and FOBT+ on Eliquis  Presenting hemoglobin 7.4 , down from baseline of mid 9 to mid 10 range  ( labs in Care Everywhere). She thinks stools have been darker than usual but unsure for how long. She stopped taking taking oral iron supplements several months ago.  Chronic anemia may be due in part to CKD.  She could small bowel AVMs causing chronic GI blood loss.  Progressive anemia may be secondary to self discontinuation of oral iron supplements.  Gastrointestinal neoplasm also a possibility  Presumed heart failure exacerbation proBNP 2539 . CT chest with findings of bilateral pleural effusions favoring favoring volume overload and developing pulmonary edema. Echocardiogram results pending.  Diuresis in progress  Paroxysmal atrial fibrillation  Chronically anticoagulated with Eliquis  Last dose of Eliquis  was afternoon of 01/12/2024  Chronic GERD On twice daily pantoprazole  at home  History of left ovarian cancer Status post chemotherapy completed August 2014.  No evidence for recurrence.  Still followed by oncology  Hypothyroidism Takes Synthroid   CKD  Hypertension  AAA CT AP >> Stable dilation of the infrarenal abdominal aortic aneurysm measuring up to 2.8 cm.  Carotid artery disease  Emphysema  See [facility] for any additional medical history  / medical problems  Principal Problem:   Symptomatic anemia    PLAN:   --Once stable from cardiopulmonary standpoint she needs both EGD and  colonoscopy for evaluation of worsening anemia and Hemoccult positive stool. The risks and benefits of EGD with possible biopsies and colonoscopy with possible biopsies and removal of polyps were discussed with the patient who agrees to proceed. -- Discussed with Dr. Arlon from primary team who agrees that patient could benefit from another day of diuresis.  Her blood pressure is quite labile (if readings are accurate), she is still on 5 L of O2.  -- Continue twice daily PPI -- Clear liquid diet given   HPI   Patient admitted yesterday after being sent to ED by urgent care for shortness of breath and hypoxia.  ED workup showed that she was volume overloaded and also had acute on chronic anemic.    Patient has a history of chronic anemia.  Labs in Care Everywhere show that her baseline hemoglobin is mid to 9 to mid 10 range.  She tells me that her oncologist had her on oral iron but she stopped it a long time ago because it made her stools gooey.  She does endorse stools being darker than usual but cannot say for how long this has been going on.  She really had not noticed this until someone asked her about stool color this admission.  No red blood in stools.  No bismuth use.  She takes Eliquis .  She has not been taking any NSAIDs.  She was having GERD symptoms but those  resolved after starting twice daily pantoprazole .  She has not been having any abdominal pain.  She gets occasional constipation but no recent bowel changes.  No recent weight loss , she has gained .  No family history of colon cancer.  Her last colonoscopy was in 2014 in [place].  She was recently supposed to have another colonoscopy but had to cancel due to her husband's illness.     Pertinent GI Studies    Labs and Imaging:  Recent Labs    01/13/24 0245  PROT 6.7  ALBUMIN 3.3*  AST 28  ALT 13  ALKPHOS 80  BILITOT 1.1   Recent Labs    01/12/24 1344 01/13/24 0245 01/13/24 0832  WBC 11.8* 11.6* 10.9*  HGB 7.4*  7.2* 7.8*  HCT 23.1* 22.7* 24.4*  MCV 84.9 85.7 86.5  PLT 438* 429* 452*   Recent Labs    01/12/24 1344 01/13/24 0245  NA 141 141  K 3.8 4.4  CL 105 105  CO2 27 27  GLUCOSE 105* 82  BUN 14 14  CREATININE 1.07* 1.26*  CALCIUM  9.4 8.5*     CT CHEST ABDOMEN PELVIS W CONTRAST CLINICAL DATA:  Remote history of ovarian cancer, generalized weakness, anemia, shortness of breath  EXAM: CT CHEST, ABDOMEN, AND PELVIS WITH CONTRAST  TECHNIQUE: Multidetector CT imaging of the chest, abdomen and pelvis was performed following the standard protocol during bolus administration of intravenous contrast.  RADIATION DOSE REDUCTION: This exam was performed according to the departmental dose-optimization program which includes automated exposure control, adjustment of the mA and/or kV according to patient size and/or use of iterative reconstruction technique.  CONTRAST:  OMNIPAQUE  IOHEXOL  300 MG/ML  SOLN  COMPARISON:  04/04/2023, 09/02/2022, 05/09/2020  FINDINGS: CT CHEST FINDINGS  Cardiovascular: The heart is unremarkable without pericardial effusion. Stable calcifications of the mitral annulus. No evidence of thoracic aortic aneurysm or dissection. Stable atherosclerosis of the aorta and coronary vasculature.  Mediastinum/Nodes: Enlarged precarinal lymph node measuring 15 mm in short axis reference image 20/2, previously 13 mm. No other pathologic adenopathy elsewhere within the mediastinal, hilar, or axillary regions. Trachea and esophagus are unremarkable.  Lungs/Pleura: There are small free-flowing bilateral pleural effusions. Scattered areas of atelectasis are seen within the bilateral lower lobes, obscuring the subpleural left lower lobe nodule seen on 09/02/2022 staging exam. Stable emphysema. Upper lobe predominant interlobular septal thickening and mild ground-glass airspace disease. No pneumothorax. The central airways are patent.  Musculoskeletal: There are no  acute or destructive bony abnormalities. Reconstructed images demonstrate no additional findings.  CT ABDOMEN PELVIS FINDINGS  Hepatobiliary: No focal liver abnormality is seen. Status post cholecystectomy. No biliary dilatation.  Pancreas: Unremarkable. No pancreatic ductal dilatation or surrounding inflammatory changes.  Spleen: Normal in size without focal abnormality.  Adrenals/Urinary Tract: Stable small calcifications of the bilateral renal hila, likely a combination of vascular calcifications and punctate nonobstructing calculi. There is no obstructive uropathy within either kidney. Chronic areas of renal cortical scarring are seen, right greater than left. The adrenals and bladder are unremarkable.  Stomach/Bowel: No bowel obstruction or ileus. Prior appendectomy. Scattered sigmoid diverticulosis without diverticulitis. 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 No bowel wall thickening.  Vascular/Lymphatic: Stable dilation of the infrarenal abdominal aortic aneurysm measuring up to 2.8 cm. There is focal ulcerative plaque within this region as well, with no evidence of aortic leak or rupture. Diffuse atherosclerosis throughout the  iliac and femoral arteries with multifocal high-grade stenoses noted. No pathologic adenopathy.  Reproductive: Previous hysterectomy.  No adnexal masses.  Other: No free fluid or free intraperitoneal gas. No evidence of peritoneal nodularity or omental thickening. No abdominal wall hernia.  Musculoskeletal: No acute or destructive bony abnormalities. Reconstructed images demonstrate chronic compression deformities at T11 and L1, stable.  IMPRESSION: 1. Upper lobe predominant interlobular septal thickening, ground-glass opacities, and bilateral pleural effusions, favoring volume overload and developing pulmonary edema. 2. No evidence of intrathoracic, intra-abdominal, or  intrapelvic metastases. 3. Mild dilation of the infrarenal abdominal aorta measuring 2.8 cm, with focal ulcerative atheromatous plaque within the dilated aorta as above. No frank aneurysm. Consultation with vascular surgery could be considered for continued monitoring of the ulcerative plaque. 4. Sigmoid diverticulosis without diverticulitis. 5. Aortic Atherosclerosis (ICD10-I70.0) and Emphysema (ICD10-J43.9).  Electronically Signed   By: Ozell Daring M.D.   On: 01/12/2024 16:05 DG Chest 2 View CLINICAL DATA:  Shortness of breath.  EXAM: CHEST - 2 VIEW  COMPARISON:  Chest CT 04/04/2023 and chest radiograph 05/08/2020  FINDINGS: Left subclavian Port-A-Cath is stable with the tip at the superior cavoatrial junction. Heart size is probably enlarged. Bibasilar chest densities, left side larger than right. Findings are suggestive for pleural effusions. Lung markings are slightly enlarged and there may be some vascular congestion. Trachea is midline. Old compression fractures at T11 and L1.  IMPRESSION: 1. Bibasilar chest densities, left side greater than right. Findings are suggestive for pleural effusions. 2. Cardiomegaly with possible vascular congestion.  Electronically Signed   By: Juliene Balder M.D.   On: 01/12/2024 14:27     Past Medical History:  Diagnosis Date   GERD (gastroesophageal reflux disease)    Hypertension    Thyroid  disease     Past Surgical History:  Procedure Laterality Date   ABDOMINAL HYSTERECTOMY     APPENDECTOMY     CHOLECYSTECTOMY     EYE SURGERY     LOOP RECORDER INSERTION N/A 09/10/2019   Procedure: LOOP RECORDER INSERTION;  Surgeon: Inocencio Soyla Lunger, MD;  Location: [facility];  Service: Cardiovascular;  Laterality: N/A;    Family History  Problem Relation Age of Onset   Stroke Maternal Grandfather     Prior to Admission medications   Medication Sig Start Date End Date Taking? Authorizing Provider  amLODipine  (NORVASC )  2.5 MG tablet Take 2.5 mg by mouth daily. 02/28/22  Yes [provider]  apixaban  (ELIQUIS ) 5 MG TABS tablet Take 1 tablet (5 mg total) by mouth 2 (two) times daily. 12/05/20  Yes Camnitz, Will Lunger, MD  atorvastatin  (LIPITOR) 40 MG tablet Take 1 tablet (40 mg total) by mouth daily. 09/11/19  Yes Drusilla Sabas RAMAN, MD  Calcium  Carbonate (CALCIUM  600 PO) Take 1 tablet by mouth in the morning and at bedtime.   Yes [provider]  chlorpheniramine (CHLOR-TRIMETON) 4 MG tablet Take 4 mg by mouth 2 (two) times daily as needed for allergies.   Yes [provider]  clobetasol cream (TEMOVATE) 0.05 % Apply 1 application topically 2 (two) times daily. 09/02/19  Yes [provider]  fenofibrate  54 MG tablet Take 1 tablet (54 mg total) by mouth daily. 09/11/19  Yes Drusilla Sabas RAMAN, MD  hydrochlorothiazide  (MICROZIDE ) 12.5 MG capsule Take 12.5 mg by mouth daily.   Yes [provider]  hydrOXYzine (ATARAX) 10 MG tablet Take 10 mg by mouth 3 (three) times daily as needed for anxiety. 09/19/21  Yes [provider]  levothyroxine  (SYNTHROID ) 100 MCG tablet Take 100-150 mcg by mouth See admin instructions. Taking 1 & 1/2 tab ( ) on Sunday only. All other days 100 mcg daily. 09/22/19  Yes [provider]  magnesium  oxide (MAG-OX) 400 MG tablet Take 400 mg by mouth daily.   Yes [provider]  metoprolol  tartrate (LOPRESSOR ) 50 MG tablet Take 1 tablet (50 mg total) by mouth 2 (two) times daily. Appointment Required For Further Refills [phone] 08/16/20  Yes Dow Arland BROCKS, NP  Multiple Vitamins-Minerals (PRESERVISION AREDS 2 PO) Take 1 tablet by mouth 2 (two) times daily.   Yes [provider]  pantoprazole  (PROTONIX ) 40 MG tablet Take 40 mg by mouth 2 (two) times daily before a meal.  06/18/16  Yes [provider]  traMADol  (ULTRAM ) 50 MG tablet Take 50 mg by mouth 2 (two) times daily as needed for moderate pain.  05/25/19  Yes  [provider]    Current Facility-Administered Medications  Medication Dose Route Frequency Provider Last Rate Last Admin   acetaminophen  (TYLENOL ) tablet 650 mg  650 mg Oral Q6H PRN Dena Charleston, MD   650 mg at 01/13/24 9195   Or   acetaminophen  (TYLENOL ) suppository 650 mg  650 mg Rectal Q6H PRN Dena Charleston, MD       amLODipine  (NORVASC ) tablet 2.5 mg  2.5 mg Oral Daily Dorrell, Robert, MD   2.5 mg at 01/13/24 1036   atorvastatin  (LIPITOR) tablet 40 mg  40 mg Oral Daily Dorrell, Charleston, MD   40 mg at 01/13/24 1036   fenofibrate  tablet 54 mg  54 mg Oral Daily Dorrell, Charleston, MD   54 mg at 01/13/24 1035   hydrALAZINE  (APRESOLINE ) injection 10 mg  10 mg Intravenous Q4H PRN Dena Charleston, MD       hydrochlorothiazide  (HYDRODIURIL ) tablet 12.5 mg  12.5 mg Oral Daily Dorrell, Robert, MD   12.5 mg at 01/13/24 1036   levothyroxine  (SYNTHROID ) tablet 100 mcg  100 mcg Oral Once per day on Monday Tuesday Wednesday Thursday Friday Saturday Dena Charleston, MD   100 mcg at 01/13/24 9388   [START ON 01/18/2024] levothyroxine  (SYNTHROID ) tablet 150 mcg  150 mcg Oral Q Austin Dena, Charleston, MD       metoprolol  tartrate (LOPRESSOR ) tablet 50 mg  50 mg Oral BID Dorrell, Robert, MD   50 mg at 01/13/24 1036   ondansetron  (ZOFRAN ) tablet 4 mg  4 mg Oral Q6H PRN Dena Charleston, MD       Or   ondansetron  (ZOFRAN ) injection 4 mg  4 mg Intravenous Q6H PRN Dena Charleston, MD   4 mg at 01/12/24 2221   oxyCODONE  (Oxy IR/ROXICODONE ) immediate release tablet 5 mg  5 mg Oral Q4H PRN Dena Charleston, MD   5 mg at 01/12/24 2322   pantoprazole  (PROTONIX ) EC tablet 40 mg  40 mg Oral BID Dena Charleston, MD   40 mg at 01/13/24 1036    Allergies as of 01/12/2024 - Review Complete 01/12/2024  Allergen Reaction Noted   Erythromycin  11/11/2017   Escitalopram Other (See Comments) 08/12/2014   Bupropion Itching and Other (See Comments) 08/12/2014    Social History   Socioeconomic History   Marital  status: Married    Spouse name: Not on file   Number of children: Not on file   Years of education: Not on file   Highest education level: Not on file  Occupational History   Not on file  Tobacco Use  Smoking status: Former   Smokeless tobacco: Never   Tobacco comments:    smoked almost 40 years & quit in 2014  Vaping Use   Vaping status: Never Used  Substance and Sexual Activity   Alcohol use: Never   Drug use: Never   Sexual activity: Not on file  Other Topics Concern   Not on file  Social History Narrative   Not on file   Social Drivers of Health   Financial Resource Strain: Not on file  Food Insecurity: No Food Insecurity (01/13/2024)   Hunger Vital Sign    Worried About Running Out of Food in the Last Year: Never true    Ran Out of Food in the Last Year: Never true  Transportation Needs: No Transportation Needs (01/13/2024)   PRAPARE - Administrator, Civil Service (Medical): No    Lack of Transportation (Non-Medical): No  Physical Activity: Not on file  Stress: Not on file  Social Connections: Moderately Isolated (01/13/2024)   Social Connection and Isolation Panel    Frequency of Communication with Friends and Family: More than three times a week    Frequency of Social Gatherings with Friends and Family: More than three times a week    Attends Religious Services: More than 4 times per year    Active Member of [employer] or Organizations: No    Attends Banker Meetings: Never    Marital Status: Widowed  Intimate Partner Violence: Not At Risk (01/13/2024)   Humiliation, Afraid, Rape, and Kick questionnaire    Fear of Current or Ex-Partner: No    Emotionally Abused: No    Physically Abused: No    Sexually Abused: No     Code Status   Code Status: Full Code  Review of Systems: Bilateral lower extremity swelling .all other systems reviewed and negative except where noted in HPI.  Physical Exam: Vital signs in last 24 hours: Temp:  [97.7  F (36.5 C)-98.5 F (36.9 C)] 97.9 F (36.6 C) (12/09 0355) Pulse Rate:  [59-75] 72 (12/09 0700) Resp:  [11-22] 13 (12/09 0700) BP: (104-184)/(45-120) 107/45 (12/09 0700) SpO2:  [85 %-100 %] 100 % (12/09 0700) Weight:  [68.4 kg-69.9 kg] 68.4 kg (12/09 0355) Last BM Date : 01/13/24  General:  Pleasant female in NAD Psych:  Cooperative. Normal mood and affect Eyes: Pupils equal Ears:  Normal auditory acuity Nose: No deformity, discharge or lesions Neck:  Supple, no masses felt Lungs: Nonlabored breathing on O2 per nasal cannula . No wheezing.  Decreased breath sounds at both bases . SABRA  Heart:  Regular rate, + murmur.  Abdomen:  Soft, nondistended, nontender, active bowel sounds, no masses felt Rectal :  Deferred Msk: Symmetrical without gross deformities.  Neurologic:  Alert, oriented, grossly normal neurologically Extremities : No edema Skin:  Intact without significant lesions.    Intake/Output from previous day: 12/08 0701 - 12/09 0700 In: -  Out: 2900 [Urine:2900] Intake/Output this shift:  No intake/output data recorded.   Vina Dasen, NP-C   01/13/2024, 11:20 AM

## 2024-01-14 LAB — BASIC METABOLIC PANEL WITH GFR
Anion gap: 11 (ref 5–15)
BUN: 22 mg/dL (ref 8–23)
CO2: 33 mmol/L — ABNORMAL HIGH (ref 22–32)
Calcium: 8.5 mg/dL — ABNORMAL LOW (ref 8.9–10.3)
Chloride: 95 mmol/L — ABNORMAL LOW (ref 98–111)
Creatinine, Ser: 1.91 mg/dL — ABNORMAL HIGH (ref 0.44–1.00)
GFR, Estimated: 26 mL/min — ABNORMAL LOW (ref >60)
Glucose, Bld: 90 mg/dL (ref 70–99)
Potassium: 3.9 mmol/L (ref 3.5–5.1)
Sodium: 139 mmol/L (ref 135–145)

## 2024-01-14 LAB — CBC
HCT: 24.4 % — ABNORMAL LOW (ref 36.0–46.0)
Hemoglobin: 7.7 g/dL — ABNORMAL LOW (ref 12.0–15.0)
MCH: 27 pg (ref 26.0–34.0)
MCHC: 31.6 g/dL (ref 30.0–36.0)
MCV: 85.6 fL (ref 80.0–100.0)
Platelets: 468 K/uL — ABNORMAL HIGH (ref 150–400)
RBC: 2.85 MIL/uL — ABNORMAL LOW (ref 3.87–5.11)
RDW: 18.7 % — ABNORMAL HIGH (ref 11.5–15.5)
WBC: 12.6 K/uL — ABNORMAL HIGH (ref 4.0–10.5)
nRBC: 0 % (ref 0.0–0.2)

## 2024-01-14 LAB — GLUCOSE, CAPILLARY: Glucose-Capillary: 150 mg/dL — ABNORMAL HIGH (ref 70–99)

## 2024-01-14 LAB — MAGNESIUM: Magnesium: 1.9 mg/dL (ref 1.7–2.4)

## 2024-01-14 MED ORDER — NA SULFATE-K SULFATE-MG SULF 17.5-3.13-1.6 GM/177ML PO SOLN
0.5000 | Freq: Once | ORAL | Status: AC
  Administered 2024-01-14: 177 mL via ORAL
  Filled 2024-01-14: qty 1

## 2024-01-14 MED ORDER — SODIUM CHLORIDE 0.9 % IV SOLN
INTRAVENOUS | Status: AC
  Administered 2024-01-14: 10 mL via INTRAVENOUS

## 2024-01-14 MED ORDER — IPRATROPIUM-ALBUTEROL 0.5-2.5 (3) MG/3ML IN SOLN: 3.0000 mL | Freq: Four times a day (QID) | RESPIRATORY_TRACT | Status: DC | PRN

## 2024-01-14 MED ORDER — NA SULFATE-K SULFATE-MG SULF 17.5-3.13-1.6 GM/177ML PO SOLN
0.5000 | Freq: Once | ORAL | Status: AC
  Administered 2024-01-15: 177 mL via ORAL

## 2024-01-14 MED ORDER — SIMETHICONE 80 MG PO CHEW
240.0000 mg | CHEWABLE_TABLET | Freq: Once | ORAL | Status: DC
  Filled 2024-01-14: qty 3

## 2024-01-14 MED ORDER — SIMETHICONE 80 MG PO CHEW
240.0000 mg | CHEWABLE_TABLET | Freq: Once | ORAL | Status: AC
  Administered 2024-01-15: 240 mg via ORAL
  Filled 2024-01-14: qty 3

## 2024-01-14 NOTE — Progress Notes (Signed)
 Pt had a vagal response when she was on the [facility] while getting bowel prepped.  Pt passed out for about 30 seconds, once she woke back up, VS were WNL, CBG 150, MD was notified and aware.  RR RN came and accessed.  Son was notified as well.  Pt was instructed stay in the bed and not to get up and we would use a bed pan now instead, will continue to monitor, Thanks Garrel FALCON RN.

## 2024-01-14 NOTE — Progress Notes (Signed)
° ° °  PROCEDURAL EXPEDITER PROGRESS NOTE  Patient Name: Karina [name]  DOB:1940-10-25 Date of Admission: 01/12/2024  Date of Assessment:01/14/24   -------------------------------------------------------------------------------------------------------------------   Brief clinical summary: pt is and inpatient with HX of GERD, HTN and is scheduled for a colonoscopy and endoscopy for 01/15/2024 for anemia   Orders in place:  Yes   Communication with surgical team if no orders: n/a  Labs, test, and orders reviewed: yes  Requires surgical clearance:  No  What type of clearance: n/a  Clearance received: n/a  Barriers noted:n/a   Intervention provided by [facility] team: n/a  Barrier resolved:  not applicable   -------------------------------------------------------------------------------------------------------------------  [employer], Ronal DELENA Bald Please contact us  directly via secure chat (search for [facility]) or by calling us  at [phone] [facility]).

## 2024-01-14 NOTE — H&P (View-Only) (Signed)
 Daily Progress Note  DOA: 01/12/2024 Hospital Day: 3  Cc: Symptomatic anemia  ASSESSMENT    83 yo female with volume overload / history of CHF  Acute on chronic anemia FOBT+ on Eliquis  Chronic anemia may be due in part to CKD and  / or could have small bowel AVMs causing chronic GI blood loss.  Progressive anemia may be secondary to self discontinuation of oral iron supplements, erosive disease, PUD.  Gastrointestinal neoplasm also a possibility  Volume overload ( present on admission)  History of heart failure with preserved EF Improving. Diuresis in progress. No SHOB. On prn 02 [place]  TODAY:  Neg 4 L fluid balance since admission. Creatine bumped overnight 1.26 >> 1.91 with diuretics. Lasix  has been d/ced.    Paroxysmal atrial fibrillation  Chronically anticoagulated with Eliquis  Last dose of Eliquis  was afternoon of 01/12/2024   Chronic GERD On twice daily pantoprazole  at home   Other medical problems:  History of left ovarian cancer Hypothyroidism CKD Hypertension AAA CT AP >> Stable dilation of the infrarenal abdominal aortic aneurysm measuring up to 2.8 cm. Carotid artery disease Emphysema    Principal Problem:   Symptomatic anemia Active Problems:   Hypothyroidism   HTN (hypertension)   Paroxysmal atrial fibrillation (HCC)   GI bleed   Acute on chronic heart failure with preserved ejection fraction (HFpEF) (HCC)   Acute diastolic CHF (congestive heart failure) (HCC)   Acute respiratory failure with hypoxia (HCC)   Heme positive stool   Dark stools   Anticoagulated    PLAN   --Schedule for EGD and colonoscopy tomorrow. The risks and benefits of EGD with possible biopsies and colonoscopy with possible biopsies and removal of polyps were discussed with the patient who agrees to proceed. --Per patient request I called son Reyes and discussed indications, risks, and benefits of EGD / colonoscopy. His questions were answered.  --Son is worried about  about patient's ability to safely get up and down to the toilet during bowel prep this evening.  He will talk to nursing staff about a bedside commode.  Additionally I will leave orders for a rectal tube /collection container to use during the bowel prep if needed   Subjective   No physical complaints. No SHOB   Objective     Recent Labs    01/13/24 0245 01/13/24 0832 01/14/24 0251  WBC 11.6* 10.9* 12.6*  HGB 7.2* 7.8* 7.7*  HCT 22.7* 24.4* 24.4*  MCV 85.7 86.5 85.6  PLT 429* 452* 468*   Recent Labs    01/12/24 1443  FOLATE >20.0  VITAMINB12 663  FERRITIN 32  TIBC 424  IRONPCTSAT 8*   Recent Labs    01/12/24 1344 01/13/24 0245 01/14/24 0251  NA 141 141 139  K 3.8 4.4 3.9  CL 105 105 95*  CO2 27 27 33*  GLUCOSE 105* 82 90  BUN 14 14 22   CREATININE 1.07* 1.26* 1.91*  CALCIUM  9.4 8.5* 8.5*   Recent Labs    01/13/24 0245  PROT 6.7  ALBUMIN 3.3*  AST 28  ALT 13  ALKPHOS 80  BILITOT 1.1      Imaging:  ECHOCARDIOGRAM COMPLETE    ECHOCARDIOGRAM REPORT       Patient Name:   Karina [name] Date of Exam: 01/13/2024 Medical Rec #:  [id number]   Height:       62.0 in Accession #:    [id number]  Weight:       150.8 lb Date of  Birth:  Jun 16, 1940    BSA:          1.695 m Patient Age:    83 years    BP:           148/51 mmHg Patient Gender: F           HR:           77 bpm. Exam Location:  Inpatient  Procedure: 2D Echo, Cardiac Doppler and Color Doppler (Both Spectral and Color            Flow Doppler were utilized during procedure).  Indications:    CHF- Acute Systolic I50.21   History:        Patient has prior history of Echocardiogram examinations, most                 recent 09/10/2019. CVA; Risk Factors:Hypertension, GERD and Former                 Smoker.   Sonographer:    Koleen Popper RDCS Referring Phys: [id number] ROBERT DORRELL  IMPRESSIONS   1. Left ventricular ejection fraction, by estimation, is 60 to 65%. The left ventricle has normal  function. The left ventricle has no regional wall motion abnormalities. There is mild concentric left ventricular hypertrophy. Left ventricular diastolic  parameters were normal.  2. Right ventricular systolic function is normal. The right ventricular size is normal. There is normal pulmonary artery systolic pressure.  3. The mitral valve is degenerative. No evidence of mitral valve regurgitation. No evidence of mitral stenosis.  4. The aortic valve is normal in structure. Aortic valve regurgitation is moderate. No aortic stenosis is present.  5. The inferior vena cava is normal in size with greater than 50% respiratory variability, suggesting right atrial pressure of 3 mmHg.  FINDINGS  Left Ventricle: Left ventricular ejection fraction, by estimation, is 60 to 65%. The left ventricle has normal function. The left ventricle has no regional wall motion abnormalities. The left ventricular internal cavity size was normal in size. There is  mild concentric left ventricular hypertrophy. Left ventricular diastolic parameters were normal.  Right Ventricle: The right ventricular size is normal. No increase in right ventricular wall thickness. Right ventricular systolic function is normal. There is normal pulmonary artery systolic pressure. The tricuspid regurgitant velocity is 2.77 m/s, and  with an assumed right atrial pressure of 3 mmHg, the estimated right ventricular systolic pressure is 33.7 mmHg.  Left Atrium: Left atrial size was normal in size.  Right Atrium: Right atrial size was normal in size.  Pericardium: There is no evidence of pericardial effusion. Presence of epicardial fat layer.  Mitral Valve: The mitral valve is degenerative in appearance. No evidence of mitral valve regurgitation. No evidence of mitral valve stenosis.  Tricuspid Valve: The tricuspid valve is normal in structure. Tricuspid valve regurgitation is not demonstrated. No evidence of tricuspid stenosis.  Aortic Valve:  The aortic valve is normal in structure. Aortic valve regurgitation is moderate. No aortic stenosis is present.  Pulmonic Valve: The pulmonic valve was normal in structure. Pulmonic valve regurgitation is not visualized. No evidence of pulmonic stenosis.  Aorta: The aortic root is normal in size and structure.  Venous: The inferior vena cava is normal in size with greater than 50% respiratory variability, suggesting right atrial pressure of 3 mmHg.  IAS/Shunts: No atrial level shunt detected by color flow Doppler.    LEFT VENTRICLE PLAX 2D LVIDd:         4.10  cm      Diastology LVIDs:         2.60 cm      LV e' medial:    5.33 cm/s LV PW:         1.10 cm      LV E/e' medial:  26.1 LV IVS:        1.10 cm      LV e' lateral:   6.74 cm/s LVOT diam:     1.60 cm      LV E/e' lateral: 20.6 LV SV:         65 LV SV Index:   38 LVOT Area:     2.01 cm   LV Volumes (MOD) LV vol d, MOD A2C: 88.8 ml LV vol d, MOD A4C: 111.0 ml LV vol s, MOD A2C: 33.5 ml LV vol s, MOD A4C: 44.4 ml LV SV MOD A2C:     55.3 ml LV SV MOD A4C:     111.0 ml LV SV MOD BP:      65.5 ml  RIGHT VENTRICLE             IVC RV S prime:     13.20 cm/s  IVC diam: 1.80 cm TAPSE (M-mode): 1.9 cm  LEFT ATRIUM             Index        RIGHT ATRIUM           Index LA diam:        4.20 cm 2.48 cm/m   RA Area:     14.10 cm LA Vol (A2C):   58.1 ml 34.27 ml/m  RA Volume:   32.60 ml  19.23 ml/m LA Vol (A4C):   40.4 ml 23.83 ml/m LA Biplane Vol: 50.5 ml 29.79 ml/m  AORTIC VALVE LVOT Vmax:   144.00 cm/s LVOT Vmean:  93.700 cm/s LVOT VTI:    0.322 m   AORTA Ao Root diam: 2.70 cm Ao Asc diam:  3.40 cm  MITRAL VALVE                TRICUSPID VALVE MV Area (PHT): 4.15 cm     TR Peak grad:   30.7 mmHg MV Decel Time: 183 msec     TR Vmax:        277.00 cm/s MV E velocity: 139.00 cm/s MV A velocity: 78.20 cm/s   SHUNTS MV E/A ratio:  1.78         Systemic VTI:  0.32 m                             Systemic Diam: 1.60  cm  Kardie Tobb DO Electronically signed by Kardie Tobb DO Signature Date/Time: 01/13/2024/3:06:45 PM      Final       Scheduled inpatient medications:   amLODipine   2.5 mg Oral Daily   atorvastatin   40 mg Oral Daily   fenofibrate   54 mg Oral Daily   hydrochlorothiazide   12.5 mg Oral Daily   levothyroxine   100 mcg Oral Once per day on Monday Tuesday Wednesday Thursday Friday Saturday   [START ON 01/18/2024] levothyroxine   150 mcg Oral Q Sun   metoprolol  tartrate  50 mg Oral BID   pantoprazole   40 mg Oral BID   Continuous inpatient infusions:  PRN inpatient medications: acetaminophen  **OR** acetaminophen , hydrALAZINE , ondansetron  **OR** ondansetron  (ZOFRAN ) IV, oxyCODONE   Vital signs in last 24 hours: Temp:  [  97.6 F (36.4 C)-98.6 F (37 C)] 97.6 F (36.4 C) (12/10 0735) Pulse Rate:  [64-77] 76 (12/10 0735) Resp:  [11-18] 11 (12/10 0735) BP: (109-136)/(45-92) 136/92 (12/10 0735) SpO2:  [95 %-98 %] 96 % (12/10 0735) Last BM Date : 01/13/24  Intake/Output Summary (Last 24 hours) at 01/14/2024 1150 Last data filed at 01/14/2024 0645 Gross per 24 hour  Intake 90 ml  Output 1550 ml  Net -1460 ml    Intake/Output from previous day: 12/09 0701 - 12/10 0700 In: 90 [P.O.:90] Out: 1550 [Urine:1550] Intake/Output this shift: No intake/output data recorded.   Physical Exam:  General: Alert female in NAD Heart:  Regular rate and rhythm.  Pulmonary: Normal respiratory effort.  Decreased breath sounds at both bases Abdomen: Soft, nondistended, nontender. Normal bowel sounds. Extremities: No lower extremity edema  Neurologic: Alert and oriented Psych: Pleasant. Cooperative     LOS: 2 days   Karina [name] ,NP 01/14/2024, 11:50 AM

## 2024-01-14 NOTE — Progress Notes (Signed)
 Progress Note   Patient: Karina [name] FMW:[id number] DOB: 11-Dec-1940 DOA: 01/12/2024  DOS: the patient was seen and examined on 01/14/2024   Brief hospital course:  83 y.o. female with medical history significant of GERD, hypertension who presented to the emergency department due to shortness of breath and weakness.  Found to have anemia as well as CHF.   Assessment and Plan:   Acute chronic anemia with concern for GI bleed - Hemoglobin 7 on presentation, previous baseline around 11.  Occult stool positive.  GI consulted and following closely.  Plan for endoscopic evaluation tomorrow.  Currently on PPI twice daily.  Acute hypoxic respiratory failure (POA) - Initially on 5 L nasal cannula.  Able to be weaned down to 2 L this morning.  Observation off of O2 showed O2 sat 87-88%.  Placed back on 2 L for now.   Acute exacerbation of HFpEF - Noted dyspneic, edematous, hypoxic on presentation.   Chest x-ray personally reviewed noting congestion.  Elevated proBNP.  Echo showing EF 60%.  Responded quite well to IV Lasix  twice daily, net negative nearly 4.5L.  Will discontinue IV Lasix  as appears volume dry.  Monitor urine output.  Acute kidney injury - Serum creatinine up to 1.9 from previous baseline 1.07 couple days ago.  Likely exacerbated from diuresis.  Holding diuretics.  Will recheck BMP in AM.  Monitor urine output.  COPD - Does not appear to be in acute exacerbation.  Likely contributing to patient's dyspnea/hypoxia at baseline.  Urine nebulizers ordered.   Hypertension - Continue metoprolol , HCTZ, amlodipine .   GERD - Protonix  as above.   Hypothyroidism - Levothyroxine  on board.   Physical debilitation muscle weakness - Pending PT/OT.   Subjective: Patient resting comfortably this morning.  Says she is breathing better.  No bleeding overnight.  Denies any fever, chills, chest pain, nausea, vomiting, abdominal pain.  Physical Exam:  Vitals:   01/13/24 2106 01/14/24 0049  01/14/24 0433 01/14/24 0735  BP: (!) 118/47 (!) 109/49 (!) 119/45 (!) 136/92  Pulse: 77 64 70 76  Resp:  14 14 11   Temp:  98.2 F (36.8 C) 98.6 F (37 C) 97.6 F (36.4 C)  TempSrc:  Oral Oral Oral  SpO2:  98% 95% 96%  Weight:      Height:        GENERAL:  Alert, pleasant, no acute distress  HEENT:  EOMI CARDIOVASCULAR:  RRR, no murmurs appreciated RESPIRATORY: Poor air movement bilaterally GASTROINTESTINAL:  Soft, nontender, nondistended EXTREMITIES: Trace LE edema bilaterally NEURO:  No new focal deficits appreciated SKIN:  No rashes noted PSYCH:  Appropriate mood and affect    Data Reviewed:  Imaging Studies: ECHOCARDIOGRAM COMPLETE Result Date: 01/13/2024    ECHOCARDIOGRAM REPORT   Patient Name:   Karina [name] Date of Exam: 01/13/2024 Medical Rec #:  [id number]   Height:       62.0 in Accession #:    [id number]  Weight:       150.8 lb Date of Birth:  1940-05-03    BSA:          1.695 m Patient Age:    83 years    BP:           148/51 mmHg Patient Gender: F           HR:           77 bpm. Exam Location:  Inpatient Procedure: 2D Echo, Cardiac Doppler and Color Doppler (Both Spectral and Color  Flow Doppler were utilized during procedure). Indications:    CHF- Acute Systolic I50.21  History:        Patient has prior history of Echocardiogram examinations, most                 recent 09/10/2019. CVA; Risk Factors:Hypertension, GERD and Former                 Smoker.  Sonographer:    Koleen Popper RDCS Referring Phys: [id number] ROBERT DORRELL IMPRESSIONS  1. Left ventricular ejection fraction, by estimation, is 60 to 65%. The left ventricle has normal function. The left ventricle has no regional wall motion abnormalities. There is mild concentric left ventricular hypertrophy. Left ventricular diastolic parameters were normal.  2. Right ventricular systolic function is normal. The right ventricular size is normal. There is normal pulmonary artery systolic pressure.  3. The mitral valve  is degenerative. No evidence of mitral valve regurgitation. No evidence of mitral stenosis.  4. The aortic valve is normal in structure. Aortic valve regurgitation is moderate. No aortic stenosis is present.  5. The inferior vena cava is normal in size with greater than 50% respiratory variability, suggesting right atrial pressure of 3 mmHg. FINDINGS  Left Ventricle: Left ventricular ejection fraction, by estimation, is 60 to 65%. The left ventricle has normal function. The left ventricle has no regional wall motion abnormalities. The left ventricular internal cavity size was normal in size. There is  mild concentric left ventricular hypertrophy. Left ventricular diastolic parameters were normal. Right Ventricle: The right ventricular size is normal. No increase in right ventricular wall thickness. Right ventricular systolic function is normal. There is normal pulmonary artery systolic pressure. The tricuspid regurgitant velocity is 2.77 m/s, and  with an assumed right atrial pressure of 3 mmHg, the estimated right ventricular systolic pressure is 33.7 mmHg. Left Atrium: Left atrial size was normal in size. Right Atrium: Right atrial size was normal in size. Pericardium: There is no evidence of pericardial effusion. Presence of epicardial fat layer. Mitral Valve: The mitral valve is degenerative in appearance. No evidence of mitral valve regurgitation. No evidence of mitral valve stenosis. Tricuspid Valve: The tricuspid valve is normal in structure. Tricuspid valve regurgitation is not demonstrated. No evidence of tricuspid stenosis. Aortic Valve: The aortic valve is normal in structure. Aortic valve regurgitation is moderate. No aortic stenosis is present. Pulmonic Valve: The pulmonic valve was normal in structure. Pulmonic valve regurgitation is not visualized. No evidence of pulmonic stenosis. Aorta: The aortic root is normal in size and structure. Venous: The inferior vena cava is normal in size with greater  than 50% respiratory variability, suggesting right atrial pressure of 3 mmHg. IAS/Shunts: No atrial level shunt detected by color flow Doppler.  LEFT VENTRICLE PLAX 2D LVIDd:         4.10 cm      Diastology LVIDs:         2.60 cm      LV e' medial:    5.33 cm/s LV PW:         1.10 cm      LV E/e' medial:  26.1 LV IVS:        1.10 cm      LV e' lateral:   6.74 cm/s LVOT diam:     1.60 cm      LV E/e' lateral: 20.6 LV SV:         65 LV SV Index:   38 LVOT Area:  2.01 cm  LV Volumes (MOD) LV vol d, MOD A2C: 88.8 ml LV vol d, MOD A4C: 111.0 ml LV vol s, MOD A2C: 33.5 ml LV vol s, MOD A4C: 44.4 ml LV SV MOD A2C:     55.3 ml LV SV MOD A4C:     111.0 ml LV SV MOD BP:      65.5 ml RIGHT VENTRICLE             IVC RV S prime:     13.20 cm/s  IVC diam: 1.80 cm TAPSE (M-mode): 1.9 cm LEFT ATRIUM             Index        RIGHT ATRIUM           Index LA diam:        4.20 cm 2.48 cm/m   RA Area:     14.10 cm LA Vol (A2C):   58.1 ml 34.27 ml/m  RA Volume:   32.60 ml  19.23 ml/m LA Vol (A4C):   40.4 ml 23.83 ml/m LA Biplane Vol: 50.5 ml 29.79 ml/m  AORTIC VALVE LVOT Vmax:   144.00 cm/s LVOT Vmean:  93.700 cm/s LVOT VTI:    0.322 m  AORTA Ao Root diam: 2.70 cm Ao Asc diam:  3.40 cm MITRAL VALVE                TRICUSPID VALVE MV Area (PHT): 4.15 cm     TR Peak grad:   30.7 mmHg MV Decel Time: 183 msec     TR Vmax:        277.00 cm/s MV E velocity: 139.00 cm/s MV A velocity: 78.20 cm/s   SHUNTS MV E/A ratio:  1.78         Systemic VTI:  0.32 m                             Systemic Diam: 1.60 cm Kardie Tobb DO Electronically signed by Dub Huntsman DO Signature Date/Time: 01/13/2024/3:06:45 PM    Final    CT CHEST ABDOMEN PELVIS W CONTRAST Result Date: 01/12/2024 CLINICAL DATA:  Remote history of ovarian cancer, generalized weakness, anemia, shortness of breath EXAM: CT CHEST, ABDOMEN, AND PELVIS WITH CONTRAST TECHNIQUE: Multidetector CT imaging of the chest, abdomen and pelvis was performed following the standard protocol  during bolus administration of intravenous contrast. RADIATION DOSE REDUCTION: This exam was performed according to the departmental dose-optimization program which includes automated exposure control, adjustment of the mA and/or kV according to patient size and/or use of iterative reconstruction technique. CONTRAST:  OMNIPAQUE  IOHEXOL  300 MG/ML  SOLN COMPARISON:  04/04/2023, 09/02/2022, 05/09/2020 FINDINGS: CT CHEST FINDINGS Cardiovascular: The heart is unremarkable without pericardial effusion. Stable calcifications of the mitral annulus. No evidence of thoracic aortic aneurysm or dissection. Stable atherosclerosis of the aorta and coronary vasculature. Mediastinum/Nodes: Enlarged precarinal lymph node measuring 15 mm in short axis reference image 20/2, previously 13 mm. No other pathologic adenopathy elsewhere within the mediastinal, hilar, or axillary regions. Trachea and esophagus are unremarkable. Lungs/Pleura: There are small free-flowing bilateral pleural effusions. Scattered areas of atelectasis are seen within the bilateral lower lobes, obscuring the subpleural left lower lobe nodule seen on 09/02/2022 staging exam. Stable emphysema. Upper lobe predominant interlobular septal thickening and mild ground-glass airspace disease. No pneumothorax. The central airways are patent. Musculoskeletal: There are no acute or destructive bony abnormalities. Reconstructed images demonstrate no additional findings. CT  ABDOMEN PELVIS FINDINGS Hepatobiliary: No focal liver abnormality is seen. Status post cholecystectomy. No biliary dilatation. Pancreas: Unremarkable. No pancreatic ductal dilatation or surrounding inflammatory changes. Spleen: Normal in size without focal abnormality. Adrenals/Urinary Tract: Stable small calcifications of the bilateral renal hila, likely a combination of vascular calcifications and punctate nonobstructing calculi. There is no obstructive uropathy within either kidney. Chronic areas of  renal cortical scarring are seen, right greater than left. The adrenals and bladder are unremarkable. Stomach/Bowel: No bowel obstruction or ileus. Prior appendectomy. Scattered sigmoid diverticulosis without diverticulitis. 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 No bowel wall thickening. Vascular/Lymphatic: Stable dilation of the infrarenal abdominal aortic aneurysm measuring up to 2.8 cm. There is focal ulcerative plaque within this region as well, with no evidence of aortic leak or rupture. Diffuse atherosclerosis throughout the iliac and femoral arteries with multifocal high-grade stenoses noted. No pathologic adenopathy. Reproductive: Previous hysterectomy.  No adnexal masses. Other: No free fluid or free intraperitoneal gas. No evidence of peritoneal nodularity or omental thickening. No abdominal wall hernia. Musculoskeletal: No acute or destructive bony abnormalities. Reconstructed images demonstrate chronic compression deformities at T11 and L1, stable. IMPRESSION: 1. Upper lobe predominant interlobular septal thickening, ground-glass opacities, and bilateral pleural effusions, favoring volume overload and developing pulmonary edema. 2. No evidence of intrathoracic, intra-abdominal, or intrapelvic metastases. 3. Mild dilation of the infrarenal abdominal aorta measuring 2.8 cm, with focal ulcerative atheromatous plaque within the dilated aorta as above. No frank aneurysm. Consultation with vascular surgery could be considered for continued monitoring of the ulcerative plaque. 4. Sigmoid diverticulosis without diverticulitis. 5. Aortic Atherosclerosis (ICD10-I70.0) and Emphysema (ICD10-J43.9). Electronically Signed   By: Ozell Daring M.D.   On: 01/12/2024 16:05   DG Chest 2 View Result Date: 01/12/2024 CLINICAL DATA:  Shortness of breath. EXAM: CHEST - 2 VIEW COMPARISON:  Chest CT 04/04/2023 and chest radiograph 05/08/2020 FINDINGS: Left subclavian  Port-A-Cath is stable with the tip at the superior cavoatrial junction. Heart size is probably enlarged. Bibasilar chest densities, left side larger than right. Findings are suggestive for pleural effusions. Lung markings are slightly enlarged and there may be some vascular congestion. Trachea is midline. Old compression fractures at T11 and L1. IMPRESSION: 1. Bibasilar chest densities, left side greater than right. Findings are suggestive for pleural effusions. 2. Cardiomegaly with possible vascular congestion. Electronically Signed   By: Juliene Balder M.D.   On: 01/12/2024 14:27    There are no new results to review at this time.  Previous records (including but not limited to H&P, progress notes, nursing notes, TOC management) were reviewed in assessment of this patient.  Labs: CBC: Recent Labs  Lab 01/12/24 1344 01/13/24 0245 01/13/24 0832 01/14/24 0251  WBC 11.8* 11.6* 10.9* 12.6*  NEUTROABS  --   --  7.8*  --   HGB 7.4* 7.2* 7.8* 7.7*  HCT 23.1* 22.7* 24.4* 24.4*  MCV 84.9 85.7 86.5 85.6  PLT 438* 429* 452* 468*   Basic Metabolic Panel: Recent Labs  Lab 01/12/24 1344 01/13/24 0245 01/14/24 0251  NA 141 141 139  K 3.8 4.4 3.9  CL 105 105 95*  CO2 27 27 33*  GLUCOSE 105* 82 90  BUN 14 14 22   CREATININE 1.07* 1.26* 1.91*  CALCIUM  9.4 8.5* 8.5*  MG  --  2.0 1.9   Liver Function Tests: Recent Labs  Lab 01/13/24 0245  AST 28  ALT 13  ALKPHOS 80  BILITOT 1.1  PROT 6.7  ALBUMIN 3.3*   CBG: No results for input(s): GLUCAP in the last 168 hours.  Scheduled Meds:  amLODipine   2.5 mg Oral Daily   atorvastatin   40 mg Oral Daily   fenofibrate   54 mg Oral Daily   hydrochlorothiazide   12.5 mg Oral Daily   levothyroxine   100 mcg Oral Once per day on Monday Tuesday Wednesday Thursday Friday Saturday   [START ON 01/18/2024] levothyroxine   150 mcg Oral Q Sun   metoprolol  tartrate  50 mg Oral BID   Na Sulfate-K Sulfate-Mg Sulfate concentrate  0.5 kit Oral Once   Followed  by   Na Sulfate-K Sulfate-Mg Sulfate concentrate  0.5 kit Oral Once   pantoprazole   40 mg Oral BID   simethicone   240 mg Oral Once   Followed by   simethicone   240 mg Oral Once   Continuous Infusions:  sodium chloride      PRN Meds:.acetaminophen  **OR** acetaminophen , hydrALAZINE , ondansetron  **OR** ondansetron  (ZOFRAN ) IV, oxyCODONE   Family Communication: None at bedside  Disposition: Status is: Inpatient Remains inpatient appropriate because: Respiratory failure, CHF, endoscopy tomorrow     Time spent: 40 minutes  Length of inpatient stay: 2 days  Author: Carliss LELON Canales, DO 01/14/2024 12:54 PM  For on call review [email or web address].

## 2024-01-14 NOTE — Progress Notes (Signed)
 Daily Progress Note  DOA: 01/12/2024 Hospital Day: 3  Cc: Symptomatic anemia  ASSESSMENT    83 yo female with volume overload / history of CHF  Acute on chronic anemia FOBT+ on Eliquis  Chronic anemia may be due in part to CKD and  / or could have small bowel AVMs causing chronic GI blood loss.  Progressive anemia may be secondary to self discontinuation of oral iron supplements, erosive disease, PUD.  Gastrointestinal neoplasm also a possibility  Volume overload ( present on admission)  History of heart failure with preserved EF Improving. Diuresis in progress. No SHOB. On prn 02 [place]  TODAY:  Neg 4 L fluid balance since admission. Creatine bumped overnight 1.26 >> 1.91 with diuretics. Lasix  has been d/ced.    Paroxysmal atrial fibrillation  Chronically anticoagulated with Eliquis  Last dose of Eliquis  was afternoon of 01/12/2024   Chronic GERD On twice daily pantoprazole  at home   Other medical problems:  History of left ovarian cancer Hypothyroidism CKD Hypertension AAA CT AP >> Stable dilation of the infrarenal abdominal aortic aneurysm measuring up to 2.8 cm. Carotid artery disease Emphysema    Principal Problem:   Symptomatic anemia Active Problems:   Hypothyroidism   HTN (hypertension)   Paroxysmal atrial fibrillation (HCC)   GI bleed   Acute on chronic heart failure with preserved ejection fraction (HFpEF) (HCC)   Acute diastolic CHF (congestive heart failure) (HCC)   Acute respiratory failure with hypoxia (HCC)   Heme positive stool   Dark stools   Anticoagulated    PLAN   --Schedule for EGD and colonoscopy tomorrow. The risks and benefits of EGD with possible biopsies and colonoscopy with possible biopsies and removal of polyps were discussed with the patient who agrees to proceed. --Per patient request I called son Reyes and discussed indications, risks, and benefits of EGD / colonoscopy. His questions were answered.  --Son is worried about  about patient's ability to safely get up and down to the toilet during bowel prep this evening.  He will talk to nursing staff about a bedside commode.  Additionally I will leave orders for a rectal tube /collection container to use during the bowel prep if needed   Subjective   No physical complaints. No SHOB   Objective     Recent Labs    01/13/24 0245 01/13/24 0832 01/14/24 0251  WBC 11.6* 10.9* 12.6*  HGB 7.2* 7.8* 7.7*  HCT 22.7* 24.4* 24.4*  MCV 85.7 86.5 85.6  PLT 429* 452* 468*   Recent Labs    01/12/24 1443  FOLATE >20.0  VITAMINB12 663  FERRITIN 32  TIBC 424  IRONPCTSAT 8*   Recent Labs    01/12/24 1344 01/13/24 0245 01/14/24 0251  NA 141 141 139  K 3.8 4.4 3.9  CL 105 105 95*  CO2 27 27 33*  GLUCOSE 105* 82 90  BUN 14 14 22   CREATININE 1.07* 1.26* 1.91*  CALCIUM  9.4 8.5* 8.5*   Recent Labs    01/13/24 0245  PROT 6.7  ALBUMIN 3.3*  AST 28  ALT 13  ALKPHOS 80  BILITOT 1.1      Imaging:  ECHOCARDIOGRAM COMPLETE    ECHOCARDIOGRAM REPORT       Patient Name:   Karina [name] Date of Exam: 01/13/2024 Medical Rec #:  [id number]   Height:       62.0 in Accession #:    [id number]  Weight:       150.8 lb Date of  Birth:  Jun 16, 1940    BSA:          1.695 m Patient Age:    83 years    BP:           148/51 mmHg Patient Gender: F           HR:           77 bpm. Exam Location:  Inpatient  Procedure: 2D Echo, Cardiac Doppler and Color Doppler (Both Spectral and Color            Flow Doppler were utilized during procedure).  Indications:    CHF- Acute Systolic I50.21   History:        Patient has prior history of Echocardiogram examinations, most                 recent 09/10/2019. CVA; Risk Factors:Hypertension, GERD and Former                 Smoker.   Sonographer:    Koleen Popper RDCS Referring Phys: [id number] ROBERT DORRELL  IMPRESSIONS   1. Left ventricular ejection fraction, by estimation, is 60 to 65%. The left ventricle has normal  function. The left ventricle has no regional wall motion abnormalities. There is mild concentric left ventricular hypertrophy. Left ventricular diastolic  parameters were normal.  2. Right ventricular systolic function is normal. The right ventricular size is normal. There is normal pulmonary artery systolic pressure.  3. The mitral valve is degenerative. No evidence of mitral valve regurgitation. No evidence of mitral stenosis.  4. The aortic valve is normal in structure. Aortic valve regurgitation is moderate. No aortic stenosis is present.  5. The inferior vena cava is normal in size with greater than 50% respiratory variability, suggesting right atrial pressure of 3 mmHg.  FINDINGS  Left Ventricle: Left ventricular ejection fraction, by estimation, is 60 to 65%. The left ventricle has normal function. The left ventricle has no regional wall motion abnormalities. The left ventricular internal cavity size was normal in size. There is  mild concentric left ventricular hypertrophy. Left ventricular diastolic parameters were normal.  Right Ventricle: The right ventricular size is normal. No increase in right ventricular wall thickness. Right ventricular systolic function is normal. There is normal pulmonary artery systolic pressure. The tricuspid regurgitant velocity is 2.77 m/s, and  with an assumed right atrial pressure of 3 mmHg, the estimated right ventricular systolic pressure is 33.7 mmHg.  Left Atrium: Left atrial size was normal in size.  Right Atrium: Right atrial size was normal in size.  Pericardium: There is no evidence of pericardial effusion. Presence of epicardial fat layer.  Mitral Valve: The mitral valve is degenerative in appearance. No evidence of mitral valve regurgitation. No evidence of mitral valve stenosis.  Tricuspid Valve: The tricuspid valve is normal in structure. Tricuspid valve regurgitation is not demonstrated. No evidence of tricuspid stenosis.  Aortic Valve:  The aortic valve is normal in structure. Aortic valve regurgitation is moderate. No aortic stenosis is present.  Pulmonic Valve: The pulmonic valve was normal in structure. Pulmonic valve regurgitation is not visualized. No evidence of pulmonic stenosis.  Aorta: The aortic root is normal in size and structure.  Venous: The inferior vena cava is normal in size with greater than 50% respiratory variability, suggesting right atrial pressure of 3 mmHg.  IAS/Shunts: No atrial level shunt detected by color flow Doppler.    LEFT VENTRICLE PLAX 2D LVIDd:         4.10  cm      Diastology LVIDs:         2.60 cm      LV e' medial:    5.33 cm/s LV PW:         1.10 cm      LV E/e' medial:  26.1 LV IVS:        1.10 cm      LV e' lateral:   6.74 cm/s LVOT diam:     1.60 cm      LV E/e' lateral: 20.6 LV SV:         65 LV SV Index:   38 LVOT Area:     2.01 cm   LV Volumes (MOD) LV vol d, MOD A2C: 88.8 ml LV vol d, MOD A4C: 111.0 ml LV vol s, MOD A2C: 33.5 ml LV vol s, MOD A4C: 44.4 ml LV SV MOD A2C:     55.3 ml LV SV MOD A4C:     111.0 ml LV SV MOD BP:      65.5 ml  RIGHT VENTRICLE             IVC RV S prime:     13.20 cm/s  IVC diam: 1.80 cm TAPSE (M-mode): 1.9 cm  LEFT ATRIUM             Index        RIGHT ATRIUM           Index LA diam:        4.20 cm 2.48 cm/m   RA Area:     14.10 cm LA Vol (A2C):   58.1 ml 34.27 ml/m  RA Volume:   32.60 ml  19.23 ml/m LA Vol (A4C):   40.4 ml 23.83 ml/m LA Biplane Vol: 50.5 ml 29.79 ml/m  AORTIC VALVE LVOT Vmax:   144.00 cm/s LVOT Vmean:  93.700 cm/s LVOT VTI:    0.322 m   AORTA Ao Root diam: 2.70 cm Ao Asc diam:  3.40 cm  MITRAL VALVE                TRICUSPID VALVE MV Area (PHT): 4.15 cm     TR Peak grad:   30.7 mmHg MV Decel Time: 183 msec     TR Vmax:        277.00 cm/s MV E velocity: 139.00 cm/s MV A velocity: 78.20 cm/s   SHUNTS MV E/A ratio:  1.78         Systemic VTI:  0.32 m                             Systemic Diam: 1.60  cm  Kardie Tobb DO Electronically signed by Kardie Tobb DO Signature Date/Time: 01/13/2024/3:06:45 PM      Final       Scheduled inpatient medications:   amLODipine   2.5 mg Oral Daily   atorvastatin   40 mg Oral Daily   fenofibrate   54 mg Oral Daily   hydrochlorothiazide   12.5 mg Oral Daily   levothyroxine   100 mcg Oral Once per day on Monday Tuesday Wednesday Thursday Friday Saturday   [START ON 01/18/2024] levothyroxine   150 mcg Oral Q Sun   metoprolol  tartrate  50 mg Oral BID   pantoprazole   40 mg Oral BID   Continuous inpatient infusions:  PRN inpatient medications: acetaminophen  **OR** acetaminophen , hydrALAZINE , ondansetron  **OR** ondansetron  (ZOFRAN ) IV, oxyCODONE   Vital signs in last 24 hours: Temp:  [  97.6 F (36.4 C)-98.6 F (37 C)] 97.6 F (36.4 C) (12/10 0735) Pulse Rate:  [64-77] 76 (12/10 0735) Resp:  [11-18] 11 (12/10 0735) BP: (109-136)/(45-92) 136/92 (12/10 0735) SpO2:  [95 %-98 %] 96 % (12/10 0735) Last BM Date : 01/13/24  Intake/Output Summary (Last 24 hours) at 01/14/2024 1150 Last data filed at 01/14/2024 0645 Gross per 24 hour  Intake 90 ml  Output 1550 ml  Net -1460 ml    Intake/Output from previous day: 12/09 0701 - 12/10 0700 In: 90 [P.O.:90] Out: 1550 [Urine:1550] Intake/Output this shift: No intake/output data recorded.   Physical Exam:  General: Alert female in NAD Heart:  Regular rate and rhythm.  Pulmonary: Normal respiratory effort.  Decreased breath sounds at both bases Abdomen: Soft, nondistended, nontender. Normal bowel sounds. Extremities: No lower extremity edema  Neurologic: Alert and oriented Psych: Pleasant. Cooperative     LOS: 2 days   Vina Dasen ,NP 01/14/2024, 11:50 AM

## 2024-01-14 NOTE — Plan of Care (Signed)
°  Problem: Clinical Measurements: Goal: Will remain free from infection Outcome: Progressing Goal: Cardiovascular complication will be avoided Outcome: Progressing   Problem: Activity: Goal: Risk for activity intolerance will decrease Outcome: Progressing   Problem: Pain Managment: Goal: General experience of comfort will improve and/or be controlled Outcome: Progressing

## 2024-01-14 NOTE — Progress Notes (Signed)
 Mobility Specialist Progress Note:    01/14/24 1500  Mobility  Activity Ambulated with assistance  Level of Assistance Minimal assist, patient does 75% or more  Assistive Device Front wheel walker  Distance Ambulated (ft) 10 ft  Range of Motion/Exercises Active  Activity Response Tolerated fair;Tolerated poorly;RN notified  Mobility Referral Yes  Mobility visit 1 Mobility  Mobility Specialist Start Time (ACUTE ONLY) 1500  Mobility Specialist Stop Time (ACUTE ONLY) 1530  Mobility Specialist Time Calculation (min) (ACUTE ONLY) 30 min   Received pt laying in bed agreeable to session. Pt feeling weaker, legs feeling like rubber, but otherwise doing alright. Pt able to walk to room door decent but fatigued very quickly needing to be taken back to bed by chair. Pt able to first stand w/ CGA but needed MinA to transfer from chair to bed as well as for assistance getting back in bed. Left pt in bed w/ all needs met.  Venetia Keel Mobility Specialist Please Neurosurgeon or Rehab Office at [phone]

## 2024-01-15 ENCOUNTER — Inpatient Hospital Stay (HOSPITAL_COMMUNITY): Admitting: Certified Registered Nurse Anesthetist

## 2024-01-15 ENCOUNTER — Encounter (HOSPITAL_COMMUNITY): Admission: EM | Disposition: A | Payer: Self-pay | Source: Home / Self Care

## 2024-01-15 ENCOUNTER — Encounter (HOSPITAL_COMMUNITY): Payer: Self-pay | Admitting: Internal Medicine

## 2024-01-15 DIAGNOSIS — K317 Polyp of stomach and duodenum: Secondary | ICD-10-CM

## 2024-01-15 DIAGNOSIS — K5730 Diverticulosis of large intestine without perforation or abscess without bleeding: Secondary | ICD-10-CM

## 2024-01-15 DIAGNOSIS — K31819 Angiodysplasia of stomach and duodenum without bleeding: Secondary | ICD-10-CM

## 2024-01-15 DIAGNOSIS — D132 Benign neoplasm of duodenum: Secondary | ICD-10-CM

## 2024-01-15 DIAGNOSIS — K5520 Angiodysplasia of colon without hemorrhage: Secondary | ICD-10-CM

## 2024-01-15 DIAGNOSIS — D124 Benign neoplasm of descending colon: Secondary | ICD-10-CM

## 2024-01-15 DIAGNOSIS — K648 Other hemorrhoids: Secondary | ICD-10-CM

## 2024-01-15 DIAGNOSIS — D125 Benign neoplasm of sigmoid colon: Secondary | ICD-10-CM

## 2024-01-15 DIAGNOSIS — D126 Benign neoplasm of colon, unspecified: Secondary | ICD-10-CM

## 2024-01-15 DIAGNOSIS — I10 Essential (primary) hypertension: Secondary | ICD-10-CM

## 2024-01-15 DIAGNOSIS — I480 Paroxysmal atrial fibrillation: Secondary | ICD-10-CM

## 2024-01-15 HISTORY — PX: COLONOSCOPY: SHX5424

## 2024-01-15 HISTORY — PX: ESOPHAGOGASTRODUODENOSCOPY: SHX5428

## 2024-01-15 LAB — BASIC METABOLIC PANEL WITH GFR
Anion gap: 7 (ref 5–15)
BUN: 28 mg/dL — ABNORMAL HIGH (ref 8–23)
CO2: 37 mmol/L — ABNORMAL HIGH (ref 22–32)
Calcium: 8.3 mg/dL — ABNORMAL LOW (ref 8.9–10.3)
Chloride: 93 mmol/L — ABNORMAL LOW (ref 98–111)
Creatinine, Ser: 2.17 mg/dL — ABNORMAL HIGH (ref 0.44–1.00)
GFR, Estimated: 22 mL/min — ABNORMAL LOW (ref >60)
Glucose, Bld: 97 mg/dL (ref 70–99)
Potassium: 3.4 mmol/L — ABNORMAL LOW (ref 3.5–5.1)
Sodium: 137 mmol/L (ref 135–145)

## 2024-01-15 LAB — CBC
HCT: 21.7 % — ABNORMAL LOW (ref 36.0–46.0)
Hemoglobin: 7 g/dL — ABNORMAL LOW (ref 12.0–15.0)
MCH: 26.8 pg (ref 26.0–34.0)
MCHC: 32.3 g/dL (ref 30.0–36.0)
MCV: 83.1 fL (ref 80.0–100.0)
Platelets: 450 K/uL — ABNORMAL HIGH (ref 150–400)
RBC: 2.61 MIL/uL — ABNORMAL LOW (ref 3.87–5.11)
RDW: 18.3 % — ABNORMAL HIGH (ref 11.5–15.5)
WBC: 11 K/uL — ABNORMAL HIGH (ref 4.0–10.5)
nRBC: 0 % (ref 0.0–0.2)

## 2024-01-15 LAB — ABO/RH: ABO/RH(D): A POS

## 2024-01-15 LAB — POCT I-STAT, CHEM 8
BUN: 27 mg/dL — ABNORMAL HIGH (ref 8–23)
Calcium, Ion: 1.02 mmol/L — ABNORMAL LOW (ref 1.15–1.40)
Chloride: 93 mmol/L — ABNORMAL LOW (ref 98–111)
Creatinine, Ser: 2.1 mg/dL — ABNORMAL HIGH (ref 0.44–1.00)
Glucose, Bld: 95 mg/dL (ref 70–99)
HCT: 25 % — ABNORMAL LOW (ref 36.0–46.0)
Hemoglobin: 8.5 g/dL — ABNORMAL LOW (ref 12.0–15.0)
Potassium: 3.3 mmol/L — ABNORMAL LOW (ref 3.5–5.1)
Sodium: 139 mmol/L (ref 135–145)
TCO2: 31 mmol/L (ref 22–32)

## 2024-01-15 LAB — MAGNESIUM: Magnesium: 1.7 mg/dL (ref 1.7–2.4)

## 2024-01-15 SURGERY — COLONOSCOPY
Anesthesia: Monitor Anesthesia Care

## 2024-01-15 MED ORDER — METOPROLOL TARTRATE 25 MG PO TABS
25.0000 mg | ORAL_TABLET | Freq: Two times a day (BID) | ORAL | Status: DC
  Administered 2024-01-15 – 2024-01-17 (×4): 25 mg via ORAL
  Filled 2024-01-15 (×3): qty 1
  Filled 2024-01-15: qty 2

## 2024-01-15 MED ORDER — PROPOFOL 10 MG/ML IV BOLUS
INTRAVENOUS | Status: DC | PRN
  Administered 2024-01-15 (×2): 20 mg via INTRAVENOUS

## 2024-01-15 MED ORDER — PHENYLEPHRINE 80 MCG/ML (10ML) SYRINGE FOR IV PUSH (FOR BLOOD PRESSURE SUPPORT)
PREFILLED_SYRINGE | INTRAVENOUS | Status: DC | PRN
  Administered 2024-01-15: 160 ug via INTRAVENOUS

## 2024-01-15 MED ORDER — PROPOFOL 500 MG/50ML IV EMUL
INTRAVENOUS | Status: DC | PRN
  Administered 2024-01-15: 100 ug/kg/min via INTRAVENOUS

## 2024-01-15 NOTE — Op Note (Signed)
 [facility] Patient Name: Karina [name] Procedure Date : 01/15/2024 MRN: [id number] Attending MD: Elspeth SQUIBB. Leigh , MD, [id number] Date of Birth: Dec 23, 1940 CSN: [id number] Age: 83 Admit Type: Inpatient Procedure:                Colonoscopy Indications:              Heme positive dark stool while on Eliquis  Providers:                Elspeth P. Leigh, MD, Darleene Bare, RN, Curtistine Bishop, Technician Referring MD:              Medicines:                Monitored Anesthesia Care Complications:            No immediate complications. Estimated blood loss:                            Minimal. Estimated Blood Loss:     Estimated blood loss was minimal. Procedure:                Pre-Anesthesia Assessment:                           - Prior to the procedure, a History and Physical                            was performed, and patient medications and                            allergies were reviewed. The patient's tolerance of                            previous anesthesia was also reviewed. The risks                            and benefits of the procedure and the sedation                            options and risks were discussed with the patient.                            All questions were answered, and informed consent                            was obtained. Prior Anticoagulants: The patient has                            taken Eliquis  (apixaban ), last dose was 3 days                            prior to procedure. ASA Grade Assessment: III - A  patient with severe systemic disease. After                            reviewing the risks and benefits, the patient was                            deemed in satisfactory condition to undergo the                            procedure.                           After obtaining informed consent, the colonoscope                            was passed under direct vision.  Throughout the                            procedure, the patient's blood pressure, pulse, and                            oxygen saturations were monitored continuously. The                            [id number]-HQ190L ([id number]) Olympus colonoscope was                            introduced through the anus and advanced to the the                            cecum, identified by appendiceal orifice and                            ileocecal valve. The colonoscopy was performed                            without difficulty. The patient tolerated the                            procedure well. The quality of the bowel                            preparation was adequate. The ileocecal valve,                            appendiceal orifice, and rectum were photographed. Scope In: 11:38:45 AM Scope Out: 12:00:14 PM Scope Withdrawal Time: 0 hours 15 minutes 23 seconds  Total Procedure Duration: 0 hours 21 minutes 29 seconds  Findings:      The perianal and digital rectal examinations were normal.      A single medium-sized angiodysplastic lesion without bleeding was found       in the cecum, near the AO. Fulguration to ablate the lesion to prevent       bleeding by argon plasma was successful. To prevent bleeding  post-intervention, two hemostatic clips were successfully placed across       the area.      Two sessile polyps were found in the descending colon. The polyps were 3       to 4 mm in size. These polyps were removed with a cold snare. Resection       and retrieval were complete.      A 3 mm polyp was found in the sigmoid colon. The polyp was sessile. The       polyp was removed with a cold snare. Resection and retrieval were       complete.      A few small-mouthed diverticula were found in the sigmoid colon.      Internal hemorrhoids were found during retroflexion.      The exam was otherwise without abnormality. Impression:               - A single non-bleeding colonic angiodysplastic                             lesion. Treated with argon plasma coagulation                            (APC). Clips were placed.                           - Two 3 to 4 mm polyps in the descending colon,                            removed with a cold snare. Resected and retrieved.                           - One 3 mm polyp in the sigmoid colon, removed with                            a cold snare. Resected and retrieved.                           - Diverticulosis in the sigmoid colon.                           - Internal hemorrhoids.                           - The examination was otherwise normal.                           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 Recommendation:           - Return patient to hospital ward for ongoing care.                           -  Advance diet as tolerated.                           - Continue present medications.                           - Resume Eliquis  tomorrow.                           - Await pathology results.                           - Trend Hgb, monitor for rebleeding. She will need                            follow up labs as outpatient to ensure stable Hgb                            while maintained on anticoagulation                           - GI will sign off for now, call with questions in                            the interim. Procedure Code(s):        --- Professional ---                           [phone], 59, Colonoscopy, flexible; with control of                            bleeding, any method                           45385, Colonoscopy, flexible; with removal of                            tumor(s), polyp(s), or other lesion(s) by snare                            technique Diagnosis Code(s):        --- Professional ---                           K55.20, Angiodysplasia of colon without hemorrhage                            D12.4, Benign neoplasm of descending colon                           D12.5, Benign neoplasm of sigmoid colon                           K64.8, Other hemorrhoids                           R19.5, Other fecal abnormalities  K57.30, Diverticulosis of large intestine without                            perforation or abscess without bleeding CPT copyright 2022 American Medical Association. All rights reserved. The codes documented in this report are preliminary and upon coder review may  be revised to meet current compliance requirements. Elspeth P. Shadae Reino, MD 01/15/2024 12:18:31 PM This report has been signed electronically. Number of Addenda: 0

## 2024-01-15 NOTE — Transfer of Care (Signed)
 Immediate Anesthesia Transfer of Care Note  Patient: Karina [name]  Procedure(s) Performed: COLONOSCOPY EGD (ESOPHAGOGASTRODUODENOSCOPY)  Patient Location: PACU  Anesthesia Type:MAC  Level of Consciousness: awake, alert , and oriented  Airway & Oxygen Therapy: Patient Spontanous Breathing and Patient connected to nasal cannula oxygen  Post-op Assessment: Report given to RN and Post -op Vital signs reviewed and stable  Post vital signs: Reviewed and stable  Last Vitals:  Vitals Value Taken Time  BP 104/48 01/15/24 12:08  Temp 97.6 1208  Pulse 81 01/15/24 12:11  Resp 14 01/15/24 12:11  SpO2 96 % 01/15/24 12:11  Vitals shown include unfiled device data.  Last Pain:  Vitals:   01/15/24 1044  TempSrc: Temporal  PainSc: 7       Patients Stated Pain Goal: 0 (01/15/24 1044)  Complications: There were no known notable events for this encounter.

## 2024-01-15 NOTE — Plan of Care (Signed)

## 2024-01-15 NOTE — Op Note (Signed)
 [facility] Patient Name: Karina [name] Procedure Date : 01/15/2024 MRN: [id number] Attending MD: Elspeth SQUIBB. Leigh , MD, [id number] Date of Birth: 14-Oct-1940 CSN: [id number] Age: 83 Admit Type: Inpatient Procedure:                Upper GI endoscopy Indications:              Heme positive stool, dark stools - last dose                            Eliquis  12/8 Providers:                Elspeth P. Leigh, MD, Darleene Bare, RN, Curtistine Bishop, Technician Referring MD:              Medicines:                Monitored Anesthesia Care Complications:            No immediate complications. Estimated blood loss:                            Minimal. Estimated Blood Loss:     Estimated blood loss was minimal. Procedure:                Pre-Anesthesia Assessment:                           - Prior to the procedure, a History and Physical                            was performed, and patient medications and                            allergies were reviewed. The patient's tolerance of                            previous anesthesia was also reviewed. The risks                            and benefits of the procedure and the sedation                            options and risks were discussed with the patient.                            All questions were answered, and informed consent                            was obtained. Prior Anticoagulants: The patient has                            taken Eliquis  (apixaban ), last dose was 3 days  prior to procedure. ASA Grade Assessment: III - A                            patient with severe systemic disease. After                            reviewing the risks and benefits, the patient was                            deemed in satisfactory condition to undergo the                            procedure.                           After obtaining informed consent, the endoscope was                             passed under direct vision. Throughout the                            procedure, the patient's blood pressure, pulse, and                            oxygen saturations were monitored continuously. The                            [id number]-[id number] ([id number]) Olympus endoscope was introduced                            through the mouth, and advanced to the second part                            of duodenum. The upper GI endoscopy was                            accomplished without difficulty. The patient                            tolerated the procedure well. Scope In: Scope Out: Findings:      Esophagogastric landmarks were identified: the Z-line was found at 36       cm, the gastroesophageal junction was found at 36 cm and the upper       extent of the gastric folds was found at 38 cm from the incisors.      A 2 cm hiatal hernia was present.      The exam of the esophagus was otherwise normal.      A few polyps were noted in the gastric body / fundus, grossly c/w fundic       gland polyps. The largest was about 8mm in the fundus - biopsies were       taken with a cold forceps for histology.      The exam of the stomach was otherwise normal.      A single diminutive angiodysplastic lesion was found in the duodenal  bulb. Fulguration to ablate the lesion to prevent bleeding by argon       plasma was successful.      A single 3 mm sessile polyp was found in the duodenal bulb. The polyp       was removed with a cold biopsy forceps. Resection and retrieval were       complete.      A large diverticulum was found in the second portion of the duodenum.      The exam of the duodenum was otherwise normal. Of note, a very angulated       duodenal sweep was noted making for difficult visualization in that area. Impression:               - Esophagogastric landmarks identified.                           - 2 cm hiatal hernia.                           - Normal esophagus otherwise.                            - Suspected benign fundic gland polyps of the                            stomach, largest lesion biopsied.                           - Normal stomach otherwise.                           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                            Treated with argon plasma coagulation (APC).                           - A single duodenal polyp. Resected and retrieved.                           - Duodenal diverticulum.                           - Normal duodenum otherwise.                           Possible small AVM could be contributing to anemia.                            Ablated. Recommendation:           - Full recommendations per colonoscopy note Procedure Code(s):        --- Professional ---                           43255, 59, Esophagogastroduodenoscopy, flexible,                            transoral;  with control of bleeding, any method                           43239, Esophagogastroduodenoscopy, flexible,                            transoral; with biopsy, single or multiple Diagnosis Code(s):        --- Professional ---                           K44.9, Diaphragmatic hernia without obstruction or                            gangrene                           K31.7, Polyp of stomach and duodenum                           K31.819, Angiodysplasia of stomach and duodenum                            without bleeding                           R19.5, Other fecal abnormalities                           K57.10, Diverticulosis of small intestine without                            perforation or abscess without bleeding CPT copyright 2022 American Medical Association. All rights reserved. The codes documented in this report are preliminary and upon coder review may  be revised to meet current compliance requirements. Elspeth P. Karina Winstanley, MD 01/15/2024 12:12:26 PM This report has been signed electronically. Number of Addenda: 0

## 2024-01-15 NOTE — Progress Notes (Signed)
 Progress Note   Patient: Karina [name] FMW:[id number] DOB: 08-03-1940 DOA: 01/12/2024     3 DOS: the patient was seen and examined on 01/15/2024   Brief hospital course: Karina [name] is a 83 y.o. female with medical history significant of GERD, hypertension who presented to the emergency department due to shortness of breath and weakness.  Found to have anemia as well as CHF.   Assessment and Plan:  Acute chronic anemia with concern for GI bleed Hemoglobin dropped to 7 yesterday, previous baseline around 9-11.  Occult stool positive.  GI consult appreciated.   S/p EGD-  2cm hiatal hernia, benign fundic gland polyps of the stomach, largest biopsied. 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 Duodenal polyp resected. S/p colonoscopy- A single non-bleeding colonic angiodysplastic lesion. Treated with APC, two 3 to 4 mm polyps in the descending colon removed with a cold snare. Resected and retrieved. One 3 mm polyp in the sigmoid colon, removed. Diverticulosis in the sigmoid colon and internal hemorrhoids. Continue to monitor Hb, transfuse for Hb<7.  Acute hypoxic respiratory failure (POA) She was on 5 L nasal cannula on presentation. Weaned down to 2L after diuresis. Will continue to wean off supplemental O2. She is not on oxygen at home.   Acute exacerbation of HFpEF Noted dyspneic, edematous, hypoxic on presentation.   Chest x-ray showed congestion.  Elevated proBNP.  Echo showing EF 60%.  Responded quite well to IV Lasix  twice daily, net negative nearly 4.5L.  stop IV Lasix  due to her kidney dysfunction.  Monitor urine output.   Acute kidney injury Serum creatinine up to 2.1 from baseline around 1.3.  Likely exacerbated from diuresis.  Hold diuretics.  Will give very gentle IV fluids, recheck BMP in AM.  Monitor urine output.   COPD No acute exacerbation. Less likely contributing to patient's dyspnea/ hypoxia.  Continue bronchodilator nebulizers as  needed.   Hypertension BP lower side. Decreased metoprolol  to 25 bid, stopped hydrochlorothiazide  due to AKI.   GERD Protonix  as above.   Hypothyroidism Levothyroxine  on board.   Physical debilitation muscle weakness  PT/OT evaluation for dc plan.      Out of bed to chair. Incentive spirometry. Nursing supportive care. Fall, aspiration precautions. Diet:  Diet Orders (From admission, onward)     Start     Ordered   01/15/24 1506  DIET SOFT Room service appropriate? Yes; Fluid consistency: Thin  Diet effective now       Question Answer Comment  Room service appropriate? Yes   Fluid consistency: Thin      01/15/24 1505           DVT prophylaxis: SCDs Start: 01/12/24 2119  Level of care: Telemetry   Code Status: Full Code  Subjective: Patient is seen and examined today after GI procedure. She is lying in bed. Denies abdominal pain, nausea. Is on 2L supplemental O2. Son at bedside.  Physical Exam: Vitals:   01/15/24 1230 01/15/24 1245 01/15/24 1254 01/15/24 1313  BP: (!) 151/43 (!) 150/41 (!) 144/42 (!) 132/46  Pulse: 80 80 84 85  Resp: 10 12 14 15   Temp:    98.3 F (36.8 C)  TempSrc:    Oral  SpO2: 94% 93% 96% 95%  Weight:      Height:        General - Elderly Caucasian female, no apparent distress HEENT - PERRLA, EOMI, atraumatic head, non tender sinuses. Lung - Clear, basal rales, no rhonchi, wheezes. Heart -  S1, S2 heard, no murmurs, rubs, trace pedal edema. Abdomen - Soft, non tender, bowel sounds good Neuro - Alert, awake and oriented x 3, non focal exam. Skin - Warm and dry.  Data Reviewed:      Latest Ref Rng & Units 01/15/2024   10:58 AM 01/15/2024    2:25 AM 01/14/2024    2:51 AM  CBC  WBC 4.0 - 10.5 K/uL  11.0  12.6   Hemoglobin 12.0 - 15.0 g/dL 8.5  7.0  7.7   Hematocrit 36.0 - 46.0 % 25.0  21.7  24.4   Platelets 150 - 400 K/uL  450  468       Latest Ref Rng & Units 01/15/2024   10:58 AM 01/15/2024    2:25 AM 01/14/2024     2:51 AM  BMP  Glucose 70 - 99 mg/dL 95  97  90   BUN 8 - 23 mg/dL 27  28  22    Creatinine 0.44 - 1.00 mg/dL 7.89  7.82  8.08   Sodium 135 - 145 mmol/L 139  137  139   Potassium 3.5 - 5.1 mmol/L 3.3  3.4  3.9   Chloride 98 - 111 mmol/L 93  93  95   CO2 22 - 32 mmol/L  37  33   Calcium  8.9 - 10.3 mg/dL  8.3  8.5    No results found.  Family Communication: Discussed with patient, son at bedside. They understand and agree. All questions answered.  Disposition: Status is: Inpatient Remains inpatient appropriate because: hypoxia, monitor Hb, PT/ OT  Planned Discharge Destination: Home     Time spent: 44 minutes  Author: Concepcion Riser, MD 01/15/2024 3:53 PM Secure chat 7am to 7pm For on call review [email or web address].

## 2024-01-15 NOTE — Interval H&P Note (Signed)
 History and Physical Interval Note: Patient here for EGD and colonoscopy to evaluate anemia, heme positive dark stool. She states the prep went okay. Hgb has drifted to low 7s, checking iSTAT here and type and screen in the endo unit prior to proceeding. I have discussed risks / benefits of anesthesia and the procedures. SHe understands and wishes to proceed. Last had Eliquis  on 12/8.  01/15/2024 10:53 AM  Karina [name]  has presented today for surgery, with the diagnosis of Anemia, Hemoccult positive stool.  The various methods of treatment have been discussed with the patient and family. After consideration of risks, benefits and other options for treatment, the patient has consented to  Procedures: COLONOSCOPY (N/A) EGD (ESOPHAGOGASTRODUODENOSCOPY) (N/A) as a surgical intervention.  The patient's history has been reviewed, patient examined, no change in status, stable for surgery.  I have reviewed the patient's chart and labs.  Questions were answered to the patient's satisfaction.     Elspeth P Malina Geers

## 2024-01-15 NOTE — Plan of Care (Signed)
°  Problem: Health Behavior/Discharge Planning: Goal: Ability to manage health-related needs will improve Outcome: Progressing   Problem: Clinical Measurements: Goal: Will remain free from infection Outcome: Progressing Goal: Respiratory complications will improve Outcome: Progressing Goal: Cardiovascular complication will be avoided Outcome: Progressing   Problem: Coping: Goal: Level of anxiety will decrease Outcome: Progressing   Problem: Elimination: Goal: Will not experience complications related to bowel motility Outcome: Progressing Goal: Will not experience complications related to urinary retention Outcome: Progressing

## 2024-01-15 NOTE — Anesthesia Preprocedure Evaluation (Signed)
 Anesthesia Evaluation  Patient identified by MRN, date of birth, ID band Patient awake    Reviewed: Allergy & Precautions, NPO status , Patient's Chart, lab work & pertinent test results  Airway Mallampati: II  TM Distance: >3 FB Neck ROM: Full    Dental no notable dental hx.    Pulmonary neg pulmonary ROS, former smoker   Pulmonary exam normal        Cardiovascular hypertension, Pt. on medications and Pt. on home beta blockers +CHF   Rhythm:Regular Rate:Normal     Neuro/Psych CVA  negative psych ROS   GI/Hepatic Neg liver ROS,GERD  Medicated,,  Endo/Other  Hypothyroidism    Renal/GU negative Renal ROS  negative genitourinary   Musculoskeletal negative musculoskeletal ROS (+)    Abdominal Normal abdominal exam  (+)   Peds  Hematology  (+) Blood dyscrasia, anemia Lab Results      Component                Value               Date                      WBC                      11.0 (H)            01/15/2024                HGB                      7.0 (L)             01/15/2024                HCT                      21.7 (L)            01/15/2024                MCV                      83.1                01/15/2024                PLT                      450 (H)             01/15/2024             Lab Results      Component                Value               Date                      NA                       137                 01/15/2024                K  3.4 (L)             01/15/2024                CO2                      37 (H)              01/15/2024                GLUCOSE                  97                  01/15/2024                BUN                      28 (H)              01/15/2024                CREATININE               2.17 (H)            01/15/2024                CALCIUM                   8.3 (L)             01/15/2024                GFRNONAA                 22 (L)               01/15/2024              Anesthesia Other Findings   Reproductive/Obstetrics                              Anesthesia Physical Anesthesia Plan  ASA: 3  Anesthesia Plan: MAC   Post-op Pain Management:    Induction: Intravenous  PONV Risk Score and Plan: 2 and Treatment may vary due to age or medical condition and Propofol infusion  Airway Management Planned: Simple Face Mask and Nasal Cannula  Additional Equipment: None  Intra-op Plan:   Post-operative Plan:   Informed Consent: I have reviewed the patients History and Physical, chart, labs and discussed the procedure including the risks, benefits and alternatives for the proposed anesthesia with the patient or authorized representative who has indicated his/her understanding and acceptance.     Dental advisory given  Plan Discussed with: CRNA  Anesthesia Plan Comments:          Anesthesia Quick Evaluation

## 2024-01-16 ENCOUNTER — Other Ambulatory Visit (HOSPITAL_COMMUNITY): Payer: Self-pay

## 2024-01-16 ENCOUNTER — Telehealth (HOSPITAL_COMMUNITY): Payer: Self-pay | Admitting: Pharmacist

## 2024-01-16 DIAGNOSIS — D509 Iron deficiency anemia, unspecified: Secondary | ICD-10-CM | POA: Insufficient documentation

## 2024-01-16 LAB — MAGNESIUM: Magnesium: 1.8 mg/dL (ref 1.7–2.4)

## 2024-01-16 LAB — BASIC METABOLIC PANEL WITH GFR
Anion gap: 13 (ref 5–15)
BUN: 25 mg/dL — ABNORMAL HIGH (ref 8–23)
CO2: 29 mmol/L (ref 22–32)
Calcium: 8.8 mg/dL — ABNORMAL LOW (ref 8.9–10.3)
Chloride: 97 mmol/L — ABNORMAL LOW (ref 98–111)
Creatinine, Ser: 1.64 mg/dL — ABNORMAL HIGH (ref 0.44–1.00)
GFR, Estimated: 31 mL/min — ABNORMAL LOW (ref >60)
Glucose, Bld: 126 mg/dL — ABNORMAL HIGH (ref 70–99)
Potassium: 2.9 mmol/L — ABNORMAL LOW (ref 3.5–5.1)
Sodium: 139 mmol/L (ref 135–145)

## 2024-01-16 LAB — HEMOGLOBIN AND HEMATOCRIT, BLOOD
HCT: 22.3 % — ABNORMAL LOW (ref 36.0–46.0)
Hemoglobin: 7.3 g/dL — ABNORMAL LOW (ref 12.0–15.0)

## 2024-01-16 LAB — SURGICAL PATHOLOGY

## 2024-01-16 MED ORDER — IRON SUCROSE 500 MG IVPB - SIMPLE MED
200.0000 mg | INTRAVENOUS | Status: DC
  Filled 2024-01-16: qty 275

## 2024-01-16 MED ORDER — POTASSIUM CHLORIDE CRYS ER 20 MEQ PO TBCR
40.0000 meq | EXTENDED_RELEASE_TABLET | Freq: Once | ORAL | Status: AC
  Administered 2024-01-16: 40 meq via ORAL
  Filled 2024-01-16: qty 2

## 2024-01-16 MED ORDER — POTASSIUM CHLORIDE 10 MEQ/100ML IV SOLN: 10.0000 meq | INTRAVENOUS | Status: DC

## 2024-01-16 MED ORDER — POTASSIUM CHLORIDE 10 MEQ/100ML IV SOLN
10.0000 meq | INTRAVENOUS | Status: AC
  Administered 2024-01-16 (×3): 10 meq via INTRAVENOUS
  Filled 2024-01-16 (×3): qty 100

## 2024-01-16 MED ORDER — SENNA 8.6 MG PO TABS
1.0000 | ORAL_TABLET | Freq: Two times a day (BID) | ORAL | Status: DC
  Administered 2024-01-16 – 2024-01-19 (×6): 8.6 mg via ORAL
  Filled 2024-01-16 (×6): qty 1

## 2024-01-16 MED ORDER — SODIUM CHLORIDE 0.9 % IV SOLN
200.0000 mg | INTRAVENOUS | Status: AC
  Administered 2024-01-16: 17:00:00 200 mg via INTRAVENOUS
  Filled 2024-01-16: qty 10

## 2024-01-16 NOTE — TOC Progression Note (Signed)
 Transition of Care (TOC) - Progression Note  Rayfield Gobble RN, BSN Inpatient Care Management Unit 4E- RN Case Manager See Treatment Team for direct phone # 3E cross coverage  Patient Details  Name: Karina [name] MRN: [id number] Date of Birth: 09/07/1940  Transition of Care [facility]) CM/SW Contact  Gobble Rayfield Hurst, RN Phone Number: 01/16/2024, 4:05 PM  Clinical Narrative:    Per CSW pt refusing SNF- wants to go home w/ HH. Has had HH in past but does not remember name- per review in [facility]- pt used [facility] in the past.   Will send referral in Epic Hub to [place] for [facility] needs- pt will HH orders placed prior to discharge. No DME needs noted.    Expected Discharge Plan: Home w Home Health Services Barriers to Discharge: Continued Medical Work up               Expected Discharge Plan and Services In-house Referral: Clinical Social Work Discharge Planning Services: CM Consult Post Acute Care Choice: Home Health Living arrangements for the past 2 months: Single Family Home                           HH Arranged: Refused SNF [facility] Agency: [facility] Date [facility] Agency Contacted: 01/16/24   Representative spoke with at [facility] Agency: Hub   Social Drivers of Health (SDOH) Interventions SDOH Screenings   Food Insecurity: No Food Insecurity (01/13/2024)  Housing: Low Risk (01/13/2024)  Transportation Needs: No Transportation Needs (01/13/2024)  Utilities: Not At Risk (01/13/2024)  Social Connections: Moderately Isolated (01/13/2024)  Tobacco Use: Medium Risk (01/15/2024)    Readmission Risk Interventions    01/13/2024   11:34 AM  Readmission Risk Prevention Plan  Transportation Screening Complete  PCP or Specialist Appt within 5-7 Days Complete  Home Care Screening Complete  Medication Review (RN CM) Complete

## 2024-01-16 NOTE — Evaluation (Signed)
 Physical Therapy Evaluation Patient Details Name: Karina [name] MRN: [id number] DOB: [birth year] Today's Date: 01/16/2024  History of Present Illness  83 y.o. female admitted 01/12/24 with SOB, weakness. Workup for anemia, concern for GIB, AKI, CHF exacerbation. S/p EGD and colonoscopy. PMH includes CHF, afib, COPD, HTN, GERD, ovarian CA, CVA.  Clinical Impression  Pt presents with an overall decrease in functional mobility secondary to above. PTA, pt mod indep with rollator, lives with husband whom she assists with ADL/iADLs. Today, pt tolerating brief bouts of standing activity with RW and minA, notable DOE and fatigue; limited by generalized weakness, decreased activity tolerance and impaired balance strategies. Pending progress, pt would benefit from post-acute rehab services (< 3 hrs/day) to maximize functional mobility and independence prior to d/c home.     SpO2 91% on RA at rest SpO2 84% on RA with activity SpO2 97% on 1L O2  at rest    If plan is discharge home, recommend the following: A little help with walking and/or transfers;A little help with bathing/dressing/bathroom;Assistance with cooking/housework;Assist for transportation;Help with stairs or ramp for entrance   Can travel by private vehicle   Yes    Equipment Recommendations None recommended by PT  Recommendations for Other Services       Functional Status Assessment Patient has had a recent decline in their functional status and demonstrates the ability to make significant improvements in function in a reasonable and predictable amount of time.     Precautions / Restrictions Precautions Precautions: Fall;Other (comment) Recall of Precautions/Restrictions: Intact Precaution/Restrictions Comments: watch SpO2 (does not wear O2 baseline) Restrictions Weight Bearing Restrictions Per Provider Order: No      Mobility  Bed Mobility Overal bed mobility: Needs Assistance Bed Mobility: Supine to Sit     Supine to sit:  Supervision, HOB elevated, Used rails     General bed mobility comments: increased time and effort, heavy reliance on rail use    Transfers Overall transfer level: Needs assistance Equipment used: Rolling walker (2 wheels) Transfers: Sit to/from Stand Sit to Stand: Min assist           General transfer comment: able to stand on third attempt with minA for trunk elevation and stability    Ambulation/Gait Ambulation/Gait assistance: Contact guard assist Gait Distance (Feet): 18 Feet Assistive device: Rolling walker (2 wheels) Gait Pattern/deviations: Step-through pattern, Decreased stride length, Trunk flexed Gait velocity: Decreased     General Gait Details: slow, gait with RW and CGA for safety/lines; distance limited by DOE and fatigue requiring seated rest break  Stairs            Wheelchair Mobility     Tilt Bed    Modified Rankin (Stroke Patients Only)       Balance Overall balance assessment: Needs assistance Sitting-balance support: No upper extremity supported, Feet supported Sitting balance-Leahy Scale: Fair Sitting balance - Comments: able to adjust bilateral socks with increased time and effort, notable DOE   Standing balance support: Single extremity supported, Bilateral upper extremity supported, Reliant on assistive device for balance Standing balance-Leahy Scale: Poor                               Pertinent Vitals/Pain Pain Assessment Pain Assessment: Faces Faces Pain Scale: Hurts little more Pain Location: upper arms when BP cuff squeezes Pain Descriptors / Indicators: Discomfort, Grimacing Pain Intervention(s): Monitored during session, Limited activity within patient's tolerance    Home Living Family/patient  expects to be discharged to:: Private residence Living Arrangements: Spouse/significant other Available Help at Discharge: Family;Available 24 hours/day Type of Home: House Home Access: Ramped entrance          Home Equipment: Agricultural Consultant (2 wheels);Rollator (4 wheels);Grab bars - toilet Additional Comments: lives with husband who she helps care for (has dementia, requires assist for all ADL/iADLs); son currently staying to assist him, may or may not be able to assist her too    Prior Function Prior Level of Function : Independent/Modified Independent             Mobility Comments: mod indep with rollator. does not drive, son drives ADLs Comments: mod indep, bird bathes baseline due to not wanting to fall in tub. assists husband with all ADL/iADLs including bird bathing     Extremity/Trunk Assessment   Upper Extremity Assessment Upper Extremity Assessment: Generalized weakness    Lower Extremity Assessment Lower Extremity Assessment: Generalized weakness       Communication   Communication Communication: Impaired Factors Affecting Communication: Hearing impaired    Cognition Arousal: Alert Behavior During Therapy: WFL for tasks assessed/performed   PT - Cognitive impairments: No apparent impairments                       PT - Cognition Comments: WFL for simple tasks, not formally assessed. suspect decreased awareness of current condition Following commands: Intact       Cueing Cueing Techniques: Verbal cues     General Comments General comments (skin integrity, edema, etc.): supine BP 109/36, sitting BP 118/57, post-mobility BP 120/80. SpO2 91% on RA at rest, SpO2 84% on RA with activity; replaced 1L O2 [place] with SpO2 97%. educ pt re: role of acute PT, POC, activity recommendations, pulmonary hygiene, importance of mobility, potential d/c needs    Exercises     Assessment/Plan    PT Assessment Patient needs continued PT services  PT Problem List Decreased strength;Decreased activity tolerance;Decreased balance;Decreased mobility;Decreased knowledge of use of DME;Cardiopulmonary status limiting activity       PT Treatment Interventions DME instruction;Gait  training;Stair training;Functional mobility training;Therapeutic activities;Therapeutic exercise;Balance training;Patient/family education    PT Goals (Current goals can be found in the Care Plan section)  Acute Rehab PT Goals Patient Stated Goal: regain strength, return home PT Goal Formulation: With patient Time For Goal Achievement: 01/30/24 Potential to Achieve Goals: Good    Frequency Min 2X/week     Co-evaluation               AM-PAC PT 6 Clicks Mobility  Outcome Measure Help needed turning from your back to your side while in a flat bed without using bedrails?: A Little Help needed moving from lying on your back to sitting on the side of a flat bed without using bedrails?: A Little Help needed moving to and from a bed to a chair (including a wheelchair)?: A Little Help needed standing up from a chair using your arms (e.g., wheelchair or bedside chair)?: A Little Help needed to walk in hospital room?: A Lot Help needed climbing 3-5 steps with a railing? : A Lot 6 Click Score: 16    End of Session Equipment Utilized During Treatment: Gait belt;Oxygen Activity Tolerance: Patient tolerated treatment well;Patient limited by fatigue Patient left: in chair;with call bell/phone within reach;with chair alarm set Nurse Communication: Mobility status PT Visit Diagnosis: Other abnormalities of gait and mobility (R26.89);Muscle weakness (generalized) (M62.81)    Time: 8679-8651 PT Time  Calculation (min) (ACUTE ONLY): 28 min   Charges:   PT Evaluation $PT Eval Moderate Complexity: 1 Mod PT Treatments $Therapeutic Activity: 8-22 mins PT General Charges $$ ACUTE PT VISIT: 1 Visit       Darice Almas, PT, DPT Acute Rehabilitation Services  Personal: Secure Chat Rehab Office: [phone]  Darice LITTIE Almas 01/16/2024, 2:39 PM

## 2024-01-16 NOTE — Telephone Encounter (Signed)
 Patient referred to infusion pharmacy team for ambulatory infusion of IV iron.  Insurance - Brewing Technologist of care - Site of care: [facility] Dx code - K92.2/D50.9 IV Iron Therapy - Feraheme 510mg  x 2 Infusion appointments - Scheduling team will schedule patient as soon as possible.   Sherry Pennant, PharmD, MPH, BCPS, CPP Clinical Pharmacist

## 2024-01-16 NOTE — Anesthesia Postprocedure Evaluation (Signed)
 Anesthesia Post Note  Patient: Karina [name]  Procedure(s) Performed: COLONOSCOPY EGD (ESOPHAGOGASTRODUODENOSCOPY)     Patient location during evaluation: PACU Anesthesia Type: MAC Level of consciousness: awake and alert Pain management: pain level controlled Vital Signs Assessment: post-procedure vital signs reviewed and stable Respiratory status: spontaneous breathing, nonlabored ventilation, respiratory function stable and patient connected to nasal cannula oxygen Cardiovascular status: stable and blood pressure returned to baseline Postop Assessment: no apparent nausea or vomiting Anesthetic complications: no   There were no known notable events for this encounter.  Last Vitals:  Vitals:   01/16/24 0015 01/16/24 0348  BP: (!) 120/48 (!) 124/49  Pulse: 80 83  Resp: 16 18  Temp: 36.7 C 37.1 C  SpO2: 97% 96%    Last Pain:  Vitals:   01/16/24 0348  TempSrc: Oral  PainSc:                  Cordella SQUIBB Charise Leinbach

## 2024-01-16 NOTE — Progress Notes (Signed)
 Pt's son Reyes (contact information in chart) contacted this nurse via phone - discussed current status and current plan of care for the day. Reyes verbalized understanding and requested a call from patient's attending provider today.  MD notified of request.

## 2024-01-16 NOTE — Care Management Important Message (Signed)
 Important Message  Patient Details  Name: Karina [name] MRN: [id number] Date of Birth: [birth year]   Important Message Given:  Yes - Medicare IM     Claretta Deed 01/16/2024, 2:40 PM

## 2024-01-16 NOTE — Progress Notes (Signed)
 Mobility Specialist Progress Note:    01/16/24 1203  Mobility  Activity Dangled on edge of bed;Stood at bedside (Heel  Raises, Leg Ext, Seated Marches, Side steps)  Level of Assistance Contact guard assist, steadying assist  Assistive Device Other (Comment) (HHA)  Distance Ambulated (ft) 3 ft  Range of Motion/Exercises Active;Active Assistive  Activity Response Tolerated fair  Mobility Referral Yes  Mobility visit 1 Mobility  Mobility Specialist Start Time (ACUTE ONLY) 1203  Mobility Specialist Stop Time (ACUTE ONLY) 1227  Mobility Specialist Time Calculation (min) (ACUTE ONLY) 24 min   Received pt laying in bed agreeable to session. Pt needing CGA<>MinA to get to EOB. Pt able to perform movements w/ no issues. Pt able to stand and take side steps to EOB w/ CGA. Pt returned back to bed w/ all needs met.   Venetia Keel Mobility Specialist Please Neurosurgeon or Rehab Office at [phone]

## 2024-01-16 NOTE — Progress Notes (Signed)
 PROGRESS NOTE    Karina [name]  FMW:[id number] DOB: March 25, 1940 DOA: 01/12/2024 PCP: Minta Jon BROCKS, NP  Subjective: No acute events overnight. Seen and examined at bedside. Reports not getting much sleep last night and feeling sleepy right now. Denies any hemoptysis, hematemesis, melena, BRBPR, hematuria, bleeding from skin. Tolerating oral intake without n/v. Denies constipation.   Hospital Course:   83 y.o. female with medical history significant of GERD, hypertension who presented to the emergency department due to shortness of breath and weakness.  Found to have acute on chronic anemia as well as CHFpEF exacerbation.   Assessment and Plan:  Acute chronic anemia  Likely due to acute GI bleed Hemoglobin 7.3 < 8.5, baseline around 9-11.  Occult stool positive.  GI consult appreciated.   S/p EGD-  2cm hiatal hernia, benign fundic gland polyps of the stomach, largest biopsied. 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 Duodenal polyp resected. S/p colonoscopy- A single non-bleeding colonic angiodysplastic lesion. Treated with APC, two 3 to 4 mm polyps in the descending colon removed with a cold snare. Resected and retrieved. One 3 mm polyp in the sigmoid colon, removed. Diverticulosis in the sigmoid colon and internal hemorrhoids. Iron studies 12/8 concerning for iron deficiency Given ongoing lethargy somnolence with concern for symptomatic anemia, will give IV iron sucrose Transfuse for Hb<7. GI signed off 12/11 Continue to monitor CBC   Acute hypoxic respiratory failure (POA) Currently on 1 < 3L oxygen, 5L on presentation, not on oxygen at home  Improved with diuresis earlier Incentive spirometry OOB to chair during daytime Wean oxygen as tolerated Will continue to wean off supplemental O2.    Acute kidney injury Serum creatinine 1.6 < 2.1, baseline around 1.3.  Likely exacerbated from IV diuresis.  Hold further diuretics.  Hold home  hydrochlorothiazide  Hold further IV fluids Monitor I/Os Monitor BMP  Acute exacerbation of HFpEF Noted dyspneic, edematous, hypoxic on presentation.   Chest x-ray showed congestion.  Elevated proBNP.  Echo showing EF 60%, IV normal with >50% respiratory variability.  Status post IV Lasix  twice daily Now off all diuresis due to AKI   Cont metoprolol  25mg  BID (takes 50mg  BID at home) Monitor I/Os, daily weights  Hypokalemia Likely in the setting of aggressive diuresis Monitor and replete electrolytes as needed  Atrial fibrillation Hx CVA in 05/2020 Cont metoprolol  as elsewhere Hold home apixaban  given concern for acute on chronic anemia Resume when Hgb stable   COPD No acute exacerbation.  Less likely contributing to patient's dyspnea/ hypoxia.   Continue bronchodilator nebulizers as needed.   Hypertension Holding home hydrochlorothiazide  due to AKI Cont decreased metoprolol  to 25 bid Up titrate as needed   GERD Protonix  as above.   Hypothyroidism Levothyroxine  on board.   Physical debilitation  PT/OT eval ordered and pending   DVT prophylaxis: SCDs Start: 01/12/24 2119  SCDs   Code Status: Full Code  Disposition Plan: TBD pending clinical course Reason for continuing need for hospitalization: IV iron, monitor CBC, electrolytes and renal function, PT evaluation pending  Objective: Vitals:   01/16/24 0015 01/16/24 0348 01/16/24 1047 01/16/24 1055  BP: (!) 120/48 (!) 124/49 (!) 133/47   Pulse: 80 83 87   Resp: 16 18    Temp: 98 F (36.7 C) 98.8 F (37.1 C)    TempSrc: Oral Oral    SpO2: 97% 96%  96%  Weight:      Height:        Intake/Output Summary (Last  24 hours) at 01/16/2024 1211 Last data filed at 01/16/2024 0930 Gross per 24 hour  Intake 595 ml  Output 560 ml  Net 35 ml   Filed Weights   01/12/24 1250 01/13/24 0355  Weight: 69.9 kg 68.4 kg    Examination:  Physical Exam Vitals and nursing note reviewed.  Constitutional:       General: She is not in acute distress.    Appearance: She is ill-appearing.     Comments: Weak, frail, pale, somnolent  HENT:     Head: Normocephalic and atraumatic.  Cardiovascular:     Rate and Rhythm: Normal rate and regular rhythm.     Pulses: Normal pulses.     Heart sounds: Normal heart sounds.  Pulmonary:     Effort: Pulmonary effort is normal.     Breath sounds: Normal breath sounds.  Abdominal:     General: Bowel sounds are normal.     Palpations: Abdomen is soft.  Musculoskeletal:     Right lower leg: No edema.     Left lower leg: No edema.  Neurological:     Mental Status: She is alert. Mental status is at baseline.     Data Reviewed: I have personally reviewed following labs and imaging studies  CBC: Recent Labs  Lab 01/12/24 1344 01/13/24 0245 01/13/24 0832 01/14/24 0251 01/15/24 0225 01/15/24 1058 01/16/24 0216  WBC 11.8* 11.6* 10.9* 12.6* 11.0*  --   --   NEUTROABS  --   --  7.8*  --   --   --   --   HGB 7.4* 7.2* 7.8* 7.7* 7.0* 8.5* 7.3*  HCT 23.1* 22.7* 24.4* 24.4* 21.7* 25.0* 22.3*  MCV 84.9 85.7 86.5 85.6 83.1  --   --   PLT 438* 429* 452* 468* 450*  --   --    Basic Metabolic Panel: Recent Labs  Lab 01/12/24 1344 01/13/24 0245 01/14/24 0251 01/15/24 0225 01/15/24 1058 01/16/24 0216  NA 141 141 139 137 139 139  K 3.8 4.4 3.9 3.4* 3.3* 2.9*  CL 105 105 95* 93* 93* 97*  CO2 27 27 33* 37*  --  29  GLUCOSE 105* 82 90 97 95 126*  BUN 14 14 22  28* 27* 25*  CREATININE 1.07* 1.26* 1.91* 2.17* 2.10* 1.64*  CALCIUM  9.4 8.5* 8.5* 8.3*  --  8.8*  MG  --  2.0 1.9 1.7  --  1.8   GFR: Estimated Creatinine Clearance: 24.4 mL/min (A) (by C-G formula based on SCr of 1.64 mg/dL (H)). Liver Function Tests: Recent Labs  Lab 01/13/24 0245  AST 28  ALT 13  ALKPHOS 80  BILITOT 1.1  PROT 6.7  ALBUMIN 3.3*   No results for input(s): LIPASE, AMYLASE in the last 168 hours. No results for input(s): AMMONIA in the last 168 hours. Coagulation  Profile: Recent Labs  Lab 01/13/24 0245  INR 1.6*   Cardiac Enzymes: No results for input(s): CKTOTAL, CKMB, CKMBINDEX, TROPONINI in the last 168 hours. ProBNP, BNP (last 5 results) Recent Labs    01/12/24 1344  PROBNP 2,539.0*   HbA1C: No results for input(s): HGBA1C in the last 72 hours. CBG: Recent Labs  Lab 01/14/24 1753  GLUCAP 150*   Lipid Profile: No results for input(s): CHOL, HDL, LDLCALC, TRIG, CHOLHDL, LDLDIRECT in the last 72 hours. Thyroid  Function Tests: No results for input(s): TSH, T4TOTAL, FREET4, T3FREE, THYROIDAB in the last 72 hours. Anemia Panel: No results for input(s): VITAMINB12, FOLATE, FERRITIN, TIBC, IRON, RETICCTPCT in  the last 72 hours. Sepsis Labs: No results for input(s): PROCALCITON, LATICACIDVEN in the last 168 hours.  No results found for this or any previous visit (from the past 240 hours).   Radiology Studies: No results found.  Scheduled Meds:  atorvastatin   40 mg Oral Daily   fenofibrate   54 mg Oral Daily   levothyroxine   100 mcg Oral Once per day on Monday Tuesday Wednesday Thursday Friday Saturday   [START ON 01/18/2024] levothyroxine   150 mcg Oral Q Sun   metoprolol  tartrate  25 mg Oral BID   pantoprazole   40 mg Oral BID   simethicone   240 mg Oral Once   Continuous Infusions:  iron sucrose       LOS: 4 days   Norval Bar, MD  [facility]  01/16/2024, 12:11 PM

## 2024-01-16 NOTE — Hospital Course (Signed)
 83 y.o. female with medical history significant of GERD, hypertension who presented to the emergency department due to shortness of breath and weakness.  Found to have acute on chronic anemia as well as CHFpEF exacerbation.

## 2024-01-16 NOTE — TOC Progression Note (Signed)
 Transition of Care [facility]) - Progression Note    Patient Details  Name: Karina [name] MRN: [id number] Date of Birth: 12/24/1940  Transition of Care [facility]) CM/SW Contact  Luise JAYSON Pan, [place] Phone Number: 01/16/2024, 4:00 PM  Clinical Narrative:   PT rec'd SNF at this time. CSW followed up with patient about therapy recommendation. Patient stated she does not want to go to a skilled facility. Patient stated she wants to return home with [facility]. CSW notified RNCM.  TOC will continue to follow.    Expected Discharge Plan: Home w Home Health Services Barriers to Discharge: Continued Medical Work up               Expected Discharge Plan and Services In-house Referral: Clinical Social Work Discharge Planning Services: CM Consult Post Acute Care Choice: Home Health Living arrangements for the past 2 months: Single Family Home                                       Social Drivers of Health (SDOH) Interventions SDOH Screenings   Food Insecurity: No Food Insecurity (01/13/2024)  Housing: Low Risk (01/13/2024)  Transportation Needs: No Transportation Needs (01/13/2024)  Utilities: Not At Risk (01/13/2024)  Social Connections: Moderately Isolated (01/13/2024)  Tobacco Use: Medium Risk (01/15/2024)    Readmission Risk Interventions    01/13/2024   11:34 AM  Readmission Risk Prevention Plan  Transportation Screening Complete  PCP or Specialist Appt within 5-7 Days Complete  Home Care Screening Complete  Medication Review (RN CM) Complete

## 2024-01-17 ENCOUNTER — Ambulatory Visit: Payer: Self-pay | Admitting: Gastroenterology

## 2024-01-17 LAB — BASIC METABOLIC PANEL WITH GFR
Anion gap: 7 (ref 5–15)
BUN: 21 mg/dL (ref 8–23)
CO2: 29 mmol/L (ref 22–32)
Calcium: 8.4 mg/dL — ABNORMAL LOW (ref 8.9–10.3)
Chloride: 105 mmol/L (ref 98–111)
Creatinine, Ser: 1.52 mg/dL — ABNORMAL HIGH (ref 0.44–1.00)
GFR, Estimated: 34 mL/min — ABNORMAL LOW (ref >60)
Glucose, Bld: 115 mg/dL — ABNORMAL HIGH (ref 70–99)
Potassium: 3.8 mmol/L (ref 3.5–5.1)
Sodium: 141 mmol/L (ref 135–145)

## 2024-01-17 LAB — CBC
HCT: 21.8 % — ABNORMAL LOW (ref 36.0–46.0)
Hemoglobin: 7 g/dL — ABNORMAL LOW (ref 12.0–15.0)
MCH: 27.2 pg (ref 26.0–34.0)
MCHC: 32.1 g/dL (ref 30.0–36.0)
MCV: 84.8 fL (ref 80.0–100.0)
Platelets: 389 K/uL (ref 150–400)
RBC: 2.57 MIL/uL — ABNORMAL LOW (ref 3.87–5.11)
RDW: 18.4 % — ABNORMAL HIGH (ref 11.5–15.5)
WBC: 12.2 K/uL — ABNORMAL HIGH (ref 4.0–10.5)
nRBC: 0 % (ref 0.0–0.2)

## 2024-01-17 LAB — HEMOGLOBIN AND HEMATOCRIT, BLOOD
HCT: 22.3 % — ABNORMAL LOW (ref 36.0–46.0)
Hemoglobin: 7.1 g/dL — ABNORMAL LOW (ref 12.0–15.0)

## 2024-01-17 MED ORDER — METOPROLOL TARTRATE 50 MG PO TABS
50.0000 mg | ORAL_TABLET | Freq: Two times a day (BID) | ORAL | Status: DC
  Administered 2024-01-17 – 2024-01-19 (×4): 50 mg via ORAL
  Filled 2024-01-17 (×4): qty 1

## 2024-01-17 MED ORDER — CHLORHEXIDINE GLUCONATE CLOTH 2 % EX PADS
6.0000 | MEDICATED_PAD | Freq: Every day | CUTANEOUS | Status: DC
  Administered 2024-01-18 – 2024-01-19 (×2): 6 via TOPICAL

## 2024-01-17 MED ORDER — SODIUM CHLORIDE 0.9% FLUSH
10.0000 mL | Freq: Two times a day (BID) | INTRAVENOUS | Status: DC
  Administered 2024-01-17 – 2024-01-19 (×5): 10 mL

## 2024-01-17 MED ORDER — SODIUM CHLORIDE 0.9% FLUSH: 10.0000 mL | INTRAVENOUS | Status: DC | PRN

## 2024-01-17 NOTE — Evaluation (Signed)
 Occupational Therapy Evaluation Patient Details Name: Karina [name] MRN: [id number] DOB: Jan 13, 1941 Today's Date: 01/17/2024   History of Present Illness   83 y.o. female admitted 01/12/24 with SOB, weakness. Workup for anemia, concern for GIB, AKI, CHF exacerbation. S/p EGD and colonoscopy. PMH includes CHF, afib, COPD, HTN, GERD, ovarian CA, CVA.     Clinical Impressions Pt reported at PLOF they use a 4WW and care for their husband. At this time she required mod I for bed mobility and CGA-light min assist for sit to stand transfers, close CGA with RW while ambulating in room to complete ADLS and toileting tasks. Pt requires set up for UE and CGA to min assist for LE ADLS. At this time encouraged pt for continued inpatient follow up therapy, <3 hours/day. However pt is declining but agreeable for [facility] services. Acute Occupational Therapy to follow and recommendation for mobility team to follow.   Pt presented on 4L at rest in bed and was placed on RA. She only de stated to 87% while on RA while completion of bed mobility but cued on breathing techniques and was able to bring back to 95%. Pt with ADLS and mobility 90-94% and was left on RA at 91-92% and nursing was made aware.      If plan is discharge home, recommend the following:   A little help with walking and/or transfers;A little help with bathing/dressing/bathroom;Assistance with cooking/housework;Assist for transportation;Help with stairs or ramp for entrance     Functional Status Assessment   Patient has had a recent decline in their functional status and demonstrates the ability to make significant improvements in function in a reasonable and predictable amount of time.     Equipment Recommendations   Tub/shower seat     Recommendations for Other Services         Precautions/Restrictions   Precautions Precautions: Fall;Other (comment) Recall of Precautions/Restrictions: Intact Precaution/Restrictions Comments: watch  SpO2 (does not wear O2 baseline) Restrictions Weight Bearing Restrictions Per Provider Order: No     Mobility Bed Mobility Overal bed mobility: Needs Assistance Bed Mobility: Supine to Sit     Supine to sit: Contact guard     General bed mobility comments: increase in time, pt also made the comment this is harder than normal'    Transfers Overall transfer level: Needs assistance Equipment used: Rolling walker (2 wheels) Transfers: Sit to/from Stand Sit to Stand: Contact guard assist, Min assist           General transfer comment: pt able to stand CGA from bed but after peri care on BSC needed light min assist      Balance Overall balance assessment: Needs assistance Sitting-balance support: Feet supported Sitting balance-Leahy Scale: Good     Standing balance support: Bilateral upper extremity supported, Single extremity supported Standing balance-Leahy Scale: Fair                             ADL either performed or assessed with clinical judgement   ADL Overall ADL's : Needs assistance/impaired Eating/Feeding: Independent;Sitting   Grooming: Wash/dry hands;Wash/dry face;Set up;Sitting;Standing   Upper Body Bathing: Set up;Sitting   Lower Body Bathing: Contact guard assist;Minimal assistance;Sit to/from stand;Sitting/lateral leans   Upper Body Dressing : Set up;Sitting   Lower Body Dressing: Contact guard assist;Minimal assistance;Sit to/from stand;Sitting/lateral leans   Toilet Transfer: Contact guard assist;Cueing for safety;Cueing for sequencing;Rolling walker (2 wheels)   Toileting- Clothing Manipulation and Hygiene: Contact guard assist;Minimal assistance;Sit  to/from stand;Sitting/lateral lean       Functional mobility during ADLs: Contact guard assist;Rolling walker (2 wheels);Cueing for sequencing;Cueing for safety       Vision Baseline Vision/History: 1 Wears glasses Ability to See in Adequate Light: 0 Adequate Patient Visual  Report: No change from baseline Vision Assessment?: Wears glasses for reading;Wears glasses for driving Additional Comments: bifocals     Perception Perception: Within Functional Limits       Praxis Praxis: WFL       Pertinent Vitals/Pain Pain Assessment Pain Assessment: No/denies pain Pain Descriptors / Indicators: Discomfort, Grimacing Pain Intervention(s): Limited activity within patient's tolerance, Monitored during session     Extremity/Trunk Assessment Upper Extremity Assessment Upper Extremity Assessment: Generalized weakness   Lower Extremity Assessment Lower Extremity Assessment: Generalized weakness   Cervical / Trunk Assessment Cervical / Trunk Assessment: Kyphotic   Communication Communication Communication: Impaired Factors Affecting Communication: Hearing impaired   Cognition Arousal: Alert Behavior During Therapy: WFL for tasks assessed/performed Cognition:  (noted she reported how she fogot some information but also got her up from sleeping)                               Following commands: Intact       Cueing  General Comments   Cueing Techniques: Verbal cues      Exercises     Shoulder Instructions      Home Living Family/patient expects to be discharged to:: Private residence Living Arrangements: Spouse/significant other Available Help at Discharge: Family;Available 24 hours/day Type of Home: House Home Access: Ramped entrance     Home Layout: One level     Bathroom Shower/Tub: Tub/shower unit;Sponge bathes at baseline   Bathroom Toilet: Handicapped height Bathroom Accessibility: Yes   Home Equipment: Agricultural Consultant (2 wheels);Rollator (4 wheels);Grab bars - toilet   Additional Comments: lives with husband who she helps care for (has dementia, requires assist for all ADL/iADLs); son currently staying to assist him, she is currenlty reporting her son wants her home to assist with caring with husband      Prior  Functioning/Environment Prior Level of Function : Independent/Modified Independent             Mobility Comments: mod indep with rollator. does not drive, son drives ADLs Comments: mod indep, bird bathes baseline due to not wanting to fall in tub. assists husband with all ADL/iADLs including bird bathing    OT Problem List: Decreased strength;Decreased activity tolerance;Impaired balance (sitting and/or standing);Decreased safety awareness;Decreased knowledge of use of DME or AE   OT Treatment/Interventions: Self-care/ADL training;Therapeutic exercise;DME and/or AE instruction;Therapeutic activities;Patient/family education;Balance training      OT Goals(Current goals can be found in the care plan section)   Acute Rehab OT Goals Patient Stated Goal: to go home OT Goal Formulation: With patient Time For Goal Achievement: 01/31/24 Potential to Achieve Goals: Good   OT Frequency:  Min 2X/week    Co-evaluation              AM-PAC OT 6 Clicks Daily Activity     Outcome Measure Help from another person eating meals?: None Help from another person taking care of personal grooming?: A Little Help from another person toileting, which includes using toliet, bedpan, or urinal?: A Little Help from another person bathing (including washing, rinsing, drying)?: A Little Help from another person to put on and taking off regular upper body clothing?: A Little Help from another  person to put on and taking off regular lower body clothing?: A Little 6 Click Score: 19   End of Session Equipment Utilized During Treatment: Gait belt;Rolling walker (2 wheels) Nurse Communication: Mobility status  Activity Tolerance: Patient tolerated treatment well Patient left: in chair;with call bell/phone within reach;with chair alarm set  OT Visit Diagnosis: Unsteadiness on feet (R26.81);Other abnormalities of gait and mobility (R26.89);Muscle weakness (generalized) (M62.81)                Time:  9149-9071 OT Time Calculation (min): 38 min Charges:  OT General Charges $OT Visit: 1 Visit OT Evaluation $OT Eval Low Complexity: 1 Low OT Treatments $Self Care/Home Management : 23-37 mins  Warrick POUR OTR/L  Acute Rehab Services  [phone] office number   Warrick Berber 01/17/2024, 10:07 AM

## 2024-01-17 NOTE — Progress Notes (Addendum)
 PROGRESS NOTE    Karina [name]  FMW:[id number] DOB: 02/01/1941 DOA: 01/12/2024 PCP: Minta Jon BROCKS, NP  Subjective: No acute events overnight. Seen and examined at bedside. Denies any hemoptysis, hematemesis, melena. Tolerating oral intake without n/v. Denies constipation.   Hospital Course: 83 y.o. female with medical history significant of GERD, hypertension who presented to the emergency department due to shortness of breath and weakness.  Found to have acute on chronic anemia as well as CHFpEF exacerbation.    Assessment and Plan:  Acute chronic anemia  Likely due to acute GI bleed Hemoglobin 7.1 < 7 < 7.3, baseline around 9-11.  Occult stool positive.    S/p EGD-  2cm hiatal hernia, benign fundic gland polyps of the stomach, largest biopsied. 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 Duodenal polyp resected. S/p colonoscopy- A single non-bleeding colonic angiodysplastic lesion. Treated with APC, two 3 to 4 mm polyps in the descending colon removed with a cold snare. Resected and retrieved. One 3 mm polyp in the sigmoid colon, removed. Diverticulosis in the sigmoid colon and internal hemorrhoids. Iron  studies 12/8 concerning for iron  deficiency Given ongoing lethargy somnolence with concern for symptomatic anemia, given one dose of IV iron  sucrose Plan to continue IV iron  as outpatient Plan to resume eliquis  on 12/14 if Hgb remains stable Transfuse for Hb<7. GI signed off 12/11 Continue to monitor CBC   Acute hypoxic respiratory failure (POA) Currently on 1 < 3L oxygen, 5L on presentation, not on oxygen at home  Improved with diuresis earlier Incentive spirometry OOB to chair during daytime Ambulatory O2 eval on 12/12 requiring 1L on ambulation Wean oxygen as tolerated   Acute kidney injury Improving Serum creatinine 1.54 < 2.1, baseline around 1.3.  Likely exacerbated from IV diuresis.  Hold further diuretics. Consider  resumption of PO on discharge Hold home hydrochlorothiazide  Hold further IV fluids Monitor I/Os Monitor BMP   Acute exacerbation of HFpEF Noted dyspneic, edematous, hypoxic on presentation.   Chest x-ray showed congestion.  Elevated proBNP.  Echo showing EF 60%, IV normal with >50% respiratory variability.  Status post IV Lasix  twice daily Now off all diuresis due to AKI   Increase metoprolol  25 to 50mg  BID (home dose) Monitor I/Os, daily weights   Hypokalemia Likely in the setting of aggressive diuresis Monitor and replete electrolytes as needed   Atrial fibrillation Hx CVA in 05/2020 Cont metoprolol  as elsewhere Plan to resume eliquis  on 12/14 if Hgb remains stable   COPD No acute exacerbation.  Less likely contributing to patient's dyspnea/ hypoxia.   Continue bronchodilator nebulizers as needed.   Hypertension Holding home hydrochlorothiazide  due to AKI Increase metoprolol  25 to 50mg  BID (home dose) Up titrate as needed   GERD Protonix  as above.   Hypothyroidism Levothyroxine  on board.  DVT prophylaxis: SCDs Start: 01/12/24 2119  SCDs   Code Status: Full Code  Disposition Plan: Home with home health as patient declining SNF Reason for continuing need for hospitalization: monitor for acute blood loss, monitor CBC  Objective: Vitals:   01/16/24 2120 01/16/24 2342 01/17/24 0411 01/17/24 0827  BP: (!) 141/57 (!) 110/43 (!) 133/49   Pulse: 89 80 80   Resp:  20 15   Temp:  98.9 F (37.2 C) 97.7 F (36.5 C)   TempSrc:  Oral Oral Oral  SpO2:  97% 99%   Weight:   63.1 kg   Height:        Intake/Output Summary (Last 24 hours) at  01/17/2024 1232 Last data filed at 01/17/2024 0612 Gross per 24 hour  Intake 170 ml  Output 100 ml  Net 70 ml   Filed Weights   01/12/24 1250 01/13/24 0355 01/17/24 0411  Weight: 69.9 kg 68.4 kg 63.1 kg    Examination:  Physical Exam Vitals and nursing note reviewed.  Constitutional:      General: She is not in acute  distress.    Appearance: She is ill-appearing.     Comments: Weak, frail, pale  HENT:     Head: Normocephalic and atraumatic.  Cardiovascular:     Rate and Rhythm: Normal rate.     Pulses: Normal pulses.     Heart sounds: Normal heart sounds.  Pulmonary:     Effort: Pulmonary effort is normal.     Breath sounds: Normal breath sounds.  Abdominal:     General: Bowel sounds are normal. There is no distension.     Palpations: Abdomen is soft.     Tenderness: There is no abdominal tenderness.  Musculoskeletal:     Right lower leg: No edema.     Left lower leg: No edema.  Neurological:     Mental Status: She is alert. Mental status is at baseline.     Data Reviewed: I have personally reviewed following labs and imaging studies  CBC: Recent Labs  Lab 01/13/24 0245 01/13/24 0832 01/14/24 0251 01/15/24 0225 01/15/24 1058 01/16/24 0216 01/17/24 0216 01/17/24 1027  WBC 11.6* 10.9* 12.6* 11.0*  --   --  12.2*  --   NEUTROABS  --  7.8*  --   --   --   --   --   --   HGB 7.2* 7.8* 7.7* 7.0* 8.5* 7.3* 7.0* 7.1*  HCT 22.7* 24.4* 24.4* 21.7* 25.0* 22.3* 21.8* 22.3*  MCV 85.7 86.5 85.6 83.1  --   --  84.8  --   PLT 429* 452* 468* 450*  --   --  389  --    Basic Metabolic Panel: Recent Labs  Lab 01/13/24 0245 01/14/24 0251 01/15/24 0225 01/15/24 1058 01/16/24 0216 01/17/24 0216  NA 141 139 137 139 139 141  K 4.4 3.9 3.4* 3.3* 2.9* 3.8  CL 105 95* 93* 93* 97* 105  CO2 27 33* 37*  --  29 29  GLUCOSE 82 90 97 95 126* 115*  BUN 14 22 28* 27* 25* 21  CREATININE 1.26* 1.91* 2.17* 2.10* 1.64* 1.52*  CALCIUM  8.5* 8.5* 8.3*  --  8.8* 8.4*  MG 2.0 1.9 1.7  --  1.8  --    GFR: Estimated Creatinine Clearance: 25.3 mL/min (A) (by C-G formula based on SCr of 1.52 mg/dL (H)). Liver Function Tests: Recent Labs  Lab 01/13/24 0245  AST 28  ALT 13  ALKPHOS 80  BILITOT 1.1  PROT 6.7  ALBUMIN 3.3*   No results for input(s): LIPASE, AMYLASE in the last 168 hours. No results  for input(s): AMMONIA in the last 168 hours. Coagulation Profile: Recent Labs  Lab 01/13/24 0245  INR 1.6*   Cardiac Enzymes: No results for input(s): CKTOTAL, CKMB, CKMBINDEX, TROPONINI in the last 168 hours. ProBNP, BNP (last 5 results) Recent Labs    01/12/24 1344  PROBNP 2,539.0*   HbA1C: No results for input(s): HGBA1C in the last 72 hours. CBG: Recent Labs  Lab 01/14/24 1753  GLUCAP 150*   Lipid Profile: No results for input(s): CHOL, HDL, LDLCALC, TRIG, CHOLHDL, LDLDIRECT in the last 72 hours. Thyroid  Function Tests:  No results for input(s): TSH, T4TOTAL, FREET4, T3FREE, THYROIDAB in the last 72 hours. Anemia Panel: No results for input(s): VITAMINB12, FOLATE, FERRITIN, TIBC, IRON , RETICCTPCT in the last 72 hours. Sepsis Labs: No results for input(s): PROCALCITON, LATICACIDVEN in the last 168 hours.  No results found for this or any previous visit (from the past 240 hours).   Radiology Studies: No results found.  Scheduled Meds:  atorvastatin   40 mg Oral Daily   Chlorhexidine  Gluconate Cloth  6 each Topical Daily   fenofibrate   54 mg Oral Daily   levothyroxine   100 mcg Oral Once per day on Monday Tuesday Wednesday Thursday Friday Saturday   [START ON 01/18/2024] levothyroxine   150 mcg Oral Q Sun   metoprolol  tartrate  25 mg Oral BID   pantoprazole   40 mg Oral BID   senna  1 tablet Oral BID   simethicone   240 mg Oral Once   sodium chloride  flush  10-40 mL Intracatheter Q12H   Continuous Infusions:   LOS: 5 days   Norval Bar, MD  [facility]  01/17/2024, 12:32 PM

## 2024-01-17 NOTE — Progress Notes (Signed)
 GI pathology results:  FINAL MICROSCOPIC DIAGNOSIS:   A. DUODENAL, POLYPECTOMY:  - Duodenal hyperplastic polyp, foveolar type  - Negative for dysplasia   B. STOMACH, POLYPECTOMY:  - Fundic gland polyp(s)  - Negative for dysplasia   C. 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  Gastric polyp biopsied which is fundic gland polyp.  These findings warrant no surveillance.  Colon polyps removed were adenomas.  3 small polyps removed in total.  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  Okay to resume Eliquis  at this point.  She should have her hemoglobin trended once out of the hospital by her primary care.  Contact us  if any recurrent anemia or bleeding    Marcey Naval, MD [facility]

## 2024-01-17 NOTE — Plan of Care (Signed)

## 2024-01-18 ENCOUNTER — Encounter (HOSPITAL_COMMUNITY): Payer: Self-pay | Admitting: Gastroenterology

## 2024-01-18 LAB — CBC
HCT: 21.1 % — ABNORMAL LOW (ref 36.0–46.0)
Hemoglobin: 6.7 g/dL — CL (ref 12.0–15.0)
MCH: 26.8 pg (ref 26.0–34.0)
MCHC: 31.8 g/dL (ref 30.0–36.0)
MCV: 84.4 fL (ref 80.0–100.0)
Platelets: 434 K/uL — ABNORMAL HIGH (ref 150–400)
RBC: 2.5 MIL/uL — ABNORMAL LOW (ref 3.87–5.11)
RDW: 18.6 % — ABNORMAL HIGH (ref 11.5–15.5)
WBC: 11.6 K/uL — ABNORMAL HIGH (ref 4.0–10.5)
nRBC: 0 % (ref 0.0–0.2)

## 2024-01-18 LAB — HEMOGLOBIN AND HEMATOCRIT, BLOOD
HCT: 26.8 % — ABNORMAL LOW (ref 36.0–46.0)
Hemoglobin: 8.8 g/dL — ABNORMAL LOW (ref 12.0–15.0)

## 2024-01-18 LAB — PREPARE RBC (CROSSMATCH)

## 2024-01-18 MED ORDER — ALTEPLASE 2 MG IJ SOLR
2.0000 mg | Freq: Once | INTRAMUSCULAR | Status: AC
  Administered 2024-01-18: 2 mg
  Filled 2024-01-18: qty 2

## 2024-01-18 MED ORDER — SODIUM CHLORIDE 0.9% IV SOLUTION: Freq: Once | INTRAVENOUS | Status: AC

## 2024-01-18 NOTE — Progress Notes (Signed)
 Mobility Specialist Progress Note;   01/18/24 1241  Mobility  Activity Ambulated with assistance;Pivoted/transferred to/from [facility]  Level of Assistance Minimal assist, patient does 75% or more  Assistive Device Front wheel walker  Distance Ambulated (ft) 60 ft  Activity Response Tolerated well  Mobility Referral Yes  Mobility visit 1 Mobility  Mobility Specialist Start Time (ACUTE ONLY) 1241  Mobility Specialist Stop Time (ACUTE ONLY) 1304  Mobility Specialist Time Calculation (min) (ACUTE ONLY) 23 min   Pt agreeable to mobility. On 1LO2 upon arrival. Requested to use BSC at [facility], BM successful. Required MinA to stand from [facility], SV throughout ambulation in hallway. Ambulated on 1LO2 SPO2 95%>. Took 1x standing rest break d/t fatigue. Pt returned back to bed and left with all needs met, alarm on.   Lauraine Erm Mobility Specialist Please contact via SecureChat or [employer] [phone]

## 2024-01-18 NOTE — Progress Notes (Signed)
 PROGRESS NOTE    Triston Lisanti  FMW:[id number] DOB: 1940-10-28 DOA: 01/12/2024 PCP: Minta Jon BROCKS, NP  Subjective: Noted to have Hgb < 7 with no signs/symptoms of acute bleed early this morning and ordered 1 unit pRBCs. Seen and examined at bedside. No new complaints. Denies hemoptysis, hematemesis, melena, hematochezia, nausea, vomiting, constipation   Hospital Course: 83 y.o. female with medical history significant of GERD, hypertension who presented to the emergency department due to shortness of breath and weakness.  Found to have acute on chronic anemia as well as CHFpEF exacerbation.    Assessment and Plan:  Acute chronic anemia  Likely due to acute GI bleed Hemoglobin 6.7 < 7.1, baseline around 9-11.  Occult stool positive.    S/p EGD-  2cm hiatal hernia, benign fundic gland polyps of the stomach, largest biopsied. 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 Duodenal polyp resected. S/p colonoscopy- A single non-bleeding colonic angiodysplastic lesion. Treated with APC, two 3 to 4 mm polyps in the descending colon removed with a cold snare. Resected and retrieved. One 3 mm polyp in the sigmoid colon, removed. Diverticulosis in the sigmoid colon and internal hemorrhoids. Iron  studies 12/8 concerning for iron  deficiency Given ongoing lethargy somnolence with concern for symptomatic anemia, given one dose of IV iron  sucrose Given 1 unit pRBC on 12/14 Plan to resume eliquis  on 12/15 if Hgb remains stable Plan to continue further IV iron  doses as outpatient Transfuse for Hb<7. GI signed off 12/11. May need to re-consult if Hgb continues to drop < 7 Continue to monitor CBC   Acute hypoxic respiratory failure (POA) Currently on 1 < 3L oxygen, 5L on presentation, not on oxygen at home  Improved with diuresis earlier Incentive spirometry OOB to chair during daytime Ambulatory O2 eval on 12/12 requiring 1L on ambulation Wean oxygen as  tolerated   Acute kidney injury Improving Serum creatinine 1.54 < 2.1, baseline around 1.3.  Likely exacerbated from IV diuresis.  Hold further diuretics. Consider resumption of PO on discharge Hold home hydrochlorothiazide  Hold further IV fluids Monitor I/Os Monitor BMP   Acute exacerbation of HFpEF Noted dyspneic, edematous, hypoxic on presentation.   Chest x-ray showed congestion.  Elevated proBNP.  Echo showing EF 60%, IV normal with >50% respiratory variability.  Status post IV Lasix  twice daily Now off all diuresis due to AKI   Cont home metoprolol  50mg  BID Monitor I/Os, daily weights   Hypokalemia Likely in the setting of aggressive diuresis Monitor and replete electrolytes as needed   Atrial fibrillation Hx CVA in 05/2020 Cont metoprolol  as elsewhere Plan to resume eliquis  on 12/14 if Hgb remains stable   COPD No acute exacerbation.  Less likely contributing to patient's dyspnea/ hypoxia.   Continue bronchodilator nebulizers as needed.   Hypertension Holding home hydrochlorothiazide  due to AKI Increase metoprolol  25 to 50mg  BID (home dose) Up titrate as needed   GERD Protonix  as above.   Hypothyroidism Levothyroxine  on board.  DVT prophylaxis: SCDs Start: 01/12/24 2119  SCDs   Code Status: Full Code Family Communication: updated son by phone on current treatment plan Disposition Plan: Home with home health as patient declining SNF  Reason for continuing need for hospitalization: monitor for acute blood loss, monitor CBC   Objective: Vitals:   01/18/24 0330 01/18/24 0552 01/18/24 0636 01/18/24 0739  BP: (!) 138/55 (!) 118/48 (!) 125/48 (!) 146/55  Pulse: 76 77 71 79  Resp: (!) 25 17 15 19   Temp: (!) 97.5  F (36.4 C) 97.6 F (36.4 C) 97.6 F (36.4 C) 98 F (36.7 C)  TempSrc: Oral Oral Oral Oral  SpO2: 98% 100% 97% 98%  Weight:      Height:        Intake/Output Summary (Last 24 hours) at 01/18/2024 1141 Last data filed at 01/18/2024  1015 Gross per 24 hour  Intake 948 ml  Output 400 ml  Net 548 ml   Filed Weights   01/12/24 1250 01/13/24 0355 01/17/24 0411  Weight: 69.9 kg 68.4 kg 63.1 kg    Examination:  Physical Exam Vitals and nursing note reviewed.  Constitutional:      General: She is not in acute distress.    Appearance: She is ill-appearing.     Comments: Weak, frail, pale  HENT:     Head: Normocephalic and atraumatic.  Cardiovascular:     Rate and Rhythm: Normal rate and regular rhythm.     Pulses: Normal pulses.     Heart sounds: Normal heart sounds.  Pulmonary:     Effort: Pulmonary effort is normal.     Breath sounds: Normal breath sounds.  Abdominal:     General: Bowel sounds are normal.     Palpations: Abdomen is soft.  Neurological:     Mental Status: She is alert. Mental status is at baseline.     Data Reviewed: I have personally reviewed following labs and imaging studies  CBC: Recent Labs  Lab 01/13/24 0832 01/14/24 0251 01/15/24 0225 01/15/24 1058 01/16/24 0216 01/17/24 0216 01/17/24 1027 01/18/24 0155 01/18/24 1103  WBC 10.9* 12.6* 11.0*  --   --  12.2*  --  11.6*  --   NEUTROABS 7.8*  --   --   --   --   --   --   --   --   HGB 7.8* 7.7* 7.0*   < > 7.3* 7.0* 7.1* 6.7* 8.8*  HCT 24.4* 24.4* 21.7*   < > 22.3* 21.8* 22.3* 21.1* 26.8*  MCV 86.5 85.6 83.1  --   --  84.8  --  84.4  --   PLT 452* 468* 450*  --   --  389  --  434*  --    < > = values in this interval not displayed.   Basic Metabolic Panel: Recent Labs  Lab 01/13/24 0245 01/14/24 0251 01/15/24 0225 01/15/24 1058 01/16/24 0216 01/17/24 0216  NA 141 139 137 139 139 141  K 4.4 3.9 3.4* 3.3* 2.9* 3.8  CL 105 95* 93* 93* 97* 105  CO2 27 33* 37*  --  29 29  GLUCOSE 82 90 97 95 126* 115*  BUN 14 22 28* 27* 25* 21  CREATININE 1.26* 1.91* 2.17* 2.10* 1.64* 1.52*  CALCIUM  8.5* 8.5* 8.3*  --  8.8* 8.4*  MG 2.0 1.9 1.7  --  1.8  --    GFR: Estimated Creatinine Clearance: 25.3 mL/min (A) (by C-G formula  based on SCr of 1.52 mg/dL (H)). Liver Function Tests: Recent Labs  Lab 01/13/24 0245  AST 28  ALT 13  ALKPHOS 80  BILITOT 1.1  PROT 6.7  ALBUMIN 3.3*   No results for input(s): LIPASE, AMYLASE in the last 168 hours. No results for input(s): AMMONIA in the last 168 hours. Coagulation Profile: Recent Labs  Lab 01/13/24 0245  INR 1.6*   Cardiac Enzymes: No results for input(s): CKTOTAL, CKMB, CKMBINDEX, TROPONINI in the last 168 hours. ProBNP, BNP (last 5 results) Recent Labs  01/12/24 1344  PROBNP 2,539.0*   HbA1C: No results for input(s): HGBA1C in the last 72 hours. CBG: Recent Labs  Lab 01/14/24 1753  GLUCAP 150*   Lipid Profile: No results for input(s): CHOL, HDL, LDLCALC, TRIG, CHOLHDL, LDLDIRECT in the last 72 hours. Thyroid  Function Tests: No results for input(s): TSH, T4TOTAL, FREET4, T3FREE, THYROIDAB in the last 72 hours. Anemia Panel: No results for input(s): VITAMINB12, FOLATE, FERRITIN, TIBC, IRON , RETICCTPCT in the last 72 hours. Sepsis Labs: No results for input(s): PROCALCITON, LATICACIDVEN in the last 168 hours.  No results found for this or any previous visit (from the past 240 hours).   Radiology Studies: No results found.  Scheduled Meds:  atorvastatin   40 mg Oral Daily   Chlorhexidine  Gluconate Cloth  6 each Topical Daily   fenofibrate   54 mg Oral Daily   levothyroxine   100 mcg Oral Once per day on Monday Tuesday Wednesday Thursday Friday Saturday   levothyroxine   150 mcg Oral Q Sun   metoprolol  tartrate  50 mg Oral BID   pantoprazole   40 mg Oral BID   senna  1 tablet Oral BID   simethicone   240 mg Oral Once   sodium chloride  flush  10-40 mL Intracatheter Q12H   Continuous Infusions:   LOS: 6 days   Norval Bar, MD  [facility]  01/18/2024, 11:41 AM

## 2024-01-18 NOTE — Plan of Care (Signed)

## 2024-01-18 NOTE — Progress Notes (Signed)
 Overnight cross coverage  Informed by RN regarding critical hemoglobin.  Hemoglobin is 6.7 on labs today and was 7.1 yesterday.  No overt bleeding reported and hemodynamically stable.  Eliquis  has been on hold.  Type and screen, 1 unit PRBCs ordered after obtaining consent from the patient.  Follow-up posttransfusion H&H and continue to transfuse if hemoglobin is less than 7.

## 2024-01-18 NOTE — Plan of Care (Signed)
°  Problem: Education: °Goal: Knowledge of General Education information will improve °Description: Including pain rating scale, medication(s)/side effects and non-pharmacologic comfort measures °Outcome: Progressing °  °Problem: Health Behavior/Discharge Planning: °Goal: Ability to manage health-related needs will improve °Outcome: Progressing °  °Problem: Clinical Measurements: °Goal: Ability to maintain clinical measurements within normal limits will improve °Outcome: Progressing °Goal: Diagnostic test results will improve °Outcome: Progressing °Goal: Cardiovascular complication will be avoided °Outcome: Progressing °  °Problem: Skin Integrity: °Goal: Risk for impaired skin integrity will decrease °Outcome: Progressing °  °

## 2024-01-19 ENCOUNTER — Telehealth (HOSPITAL_COMMUNITY): Payer: Self-pay | Admitting: Pharmacy Technician

## 2024-01-19 ENCOUNTER — Other Ambulatory Visit: Payer: Self-pay

## 2024-01-19 DIAGNOSIS — R195 Other fecal abnormalities: Secondary | ICD-10-CM

## 2024-01-19 DIAGNOSIS — D500 Iron deficiency anemia secondary to blood loss (chronic): Secondary | ICD-10-CM

## 2024-01-19 DIAGNOSIS — N1832 Chronic kidney disease, stage 3b: Secondary | ICD-10-CM

## 2024-01-19 DIAGNOSIS — K31811 Angiodysplasia of stomach and duodenum with bleeding: Secondary | ICD-10-CM

## 2024-01-19 LAB — CBC
HCT: 25.7 % — ABNORMAL LOW (ref 36.0–46.0)
Hemoglobin: 8.2 g/dL — ABNORMAL LOW (ref 12.0–15.0)
MCH: 27.6 pg (ref 26.0–34.0)
MCHC: 31.9 g/dL (ref 30.0–36.0)
MCV: 86.5 fL (ref 80.0–100.0)
Platelets: 408 K/uL — ABNORMAL HIGH (ref 150–400)
RBC: 2.97 MIL/uL — ABNORMAL LOW (ref 3.87–5.11)
RDW: 18.1 % — ABNORMAL HIGH (ref 11.5–15.5)
WBC: 11.4 K/uL — ABNORMAL HIGH (ref 4.0–10.5)
nRBC: 0 % (ref 0.0–0.2)

## 2024-01-19 LAB — BPAM RBC
Blood Product Expiration Date: 202512242359
ISSUE DATE / TIME: 202512140611
Unit Type and Rh: 6200

## 2024-01-19 LAB — TYPE AND SCREEN
ABO/RH(D): A POS
Antibody Screen: NEGATIVE
Unit division: 0

## 2024-01-19 MED ORDER — HEPARIN SOD (PORK) LOCK FLUSH 100 UNIT/ML IV SOLN
500.0000 [IU] | INTRAVENOUS | Status: AC | PRN
  Administered 2024-01-19: 12:00:00 500 [IU]

## 2024-01-19 MED ORDER — ACETAMINOPHEN 325 MG PO TABS: 650.0000 mg | ORAL_TABLET | Freq: Four times a day (QID) | ORAL | 0 refills | Status: AC | PRN

## 2024-01-19 MED ORDER — SENNA 8.6 MG PO TABS: 1.0000 | ORAL_TABLET | Freq: Two times a day (BID) | ORAL | 0 refills | Status: AC

## 2024-01-19 NOTE — Discharge Instructions (Signed)
 Get repeat blood work on Fri 01/23/24 Follow up with PCP within one week

## 2024-01-19 NOTE — Care Management Important Message (Signed)
 Important Message  Patient Details  Name: Emylee Decelle MRN: [id number] Date of Birth: 1940/11/10   Important Message Given:  Yes - Medicare IM     Vonzell Arrie Sharps 01/19/2024, 11:00 AM

## 2024-01-19 NOTE — TOC Transition Note (Addendum)
 Transition of Care [facility]) - Discharge Note   Patient Details  Name: Karina [name] MRN: [id number] Date of Birth: 1940-07-30  [facility] of [facility] CM/SW Contact:  Waddell Barnie Rama, RN Phone Number: 01/19/2024, 10:53 AM   Clinical Narrative:    For dc today, NCM notified Hedda of this information.  She has transportation at [employer].   NCM asked financial counselors to contact her son Burke , he had questions regarding applying for Medicaid.       Barriers to Discharge: Continued Medical Work up   Patient Goals and CMS Choice Patient states their goals for this hospitalization and ongoing recovery are:: to return home          Discharge Placement                       Discharge Plan and Services Additional resources added to the After Visit Summary for   In-house Referral: Clinical Social Work Discharge Planning Services: CM Consult Post Acute Care Choice: Home Health                    HH Arranged: Refused SNF [facility] Agency: [facility] Date [facility] Agency Contacted: 01/16/24   Representative spoke with at [facility] Agency: [facility]  Social Drivers of Health (SDOH) Interventions SDOH Screenings   Food Insecurity: No Food Insecurity (01/13/2024)  Housing: Low Risk (01/13/2024)  Transportation Needs: No Transportation Needs (01/13/2024)  Utilities: Not At Risk (01/13/2024)  Social Connections: Moderately Isolated (01/13/2024)  Tobacco Use: Medium Risk (01/15/2024)     Readmission Risk Interventions    01/13/2024   11:34 AM  Readmission Risk Prevention Plan  Transportation Screening Complete  PCP or Specialist Appt within 5-7 Days Complete  Home Care Screening Complete  Medication Review (RN CM) Complete

## 2024-01-19 NOTE — Progress Notes (Signed)
 Discharge instructs reviewed by discharge nurse. Patient son is ride home. Volunteer services called to wheel chair to exit.

## 2024-01-19 NOTE — Progress Notes (Signed)
 Physical Therapy Treatment Patient Details Name: Karina [name] MRN: [id number] DOB: 1940/11/22 Today's Date: 01/19/2024   History of Present Illness 83 y.o. female admitted 01/12/24 with SOB, weakness. Workup for anemia, concern for GIB, AKI, CHF exacerbation. S/p EGD and colonoscopy. PMH includes CHF, afib, COPD, HTN, GERD, ovarian CA, CVA.    PT Comments  Pt demonstrating good progress this session, progressing distance ambulated with AD and no physical assistance given. Pt would benefit from further transfer and gait training. PT will continue to treat pt while she is admitted. Recommending HHPT at discharge to address remaining mobility deficits and optimize return to PLOF.     If plan is discharge home, recommend the following: A little help with walking and/or transfers;A little help with bathing/dressing/bathroom;Assistance with cooking/housework;Assist for transportation;Help with stairs or ramp for entrance   Can travel by private vehicle        Equipment Recommendations  None recommended by PT    Recommendations for Other Services       Precautions / Restrictions Precautions Precautions: Fall;Other (comment) Recall of Precautions/Restrictions: Intact Precaution/Restrictions Comments: watch SpO2 (does not wear O2 baseline) Restrictions Weight Bearing Restrictions Per Provider Order: No     Mobility  Bed Mobility Overal bed mobility: Modified Independent Bed Mobility: Sit to Supine       Sit to supine: Modified independent (Device/Increase time)   General bed mobility comments: HOB flat. increased time to complete.    Transfers Overall transfer level: Needs assistance Equipment used: Rolling walker (2 wheels) Transfers: Sit to/from Stand Sit to Stand: Supervision           General transfer comment: pt completed sit to stand from recliner with RW and no physical assistance. Increased time to complete    Ambulation/Gait Ambulation/Gait assistance: Contact  guard assist Gait Distance (Feet): 100 Feet Assistive device: Rolling walker (2 wheels) Gait Pattern/deviations: Step-through pattern, Decreased stride length, Trunk flexed Gait velocity: Decreased Gait velocity interpretation: <1.8 ft/sec, indicate of risk for recurrent falls   General Gait Details: Pt ambulates with increased reliance on RW for stability and demonstrates reduced bilateral hip and knee flexion throughout swing phase of gait. No signs of LOB throughout.   Stairs             Wheelchair Mobility     Tilt Bed    Modified Rankin (Stroke Patients Only)       Balance Overall balance assessment: Needs assistance Sitting-balance support: No upper extremity supported, Feet supported Sitting balance-Leahy Scale: Good Sitting balance - Comments: seated edge of recliner and edge of bed w/out UE support   Standing balance support: Bilateral upper extremity supported, During functional activity, Reliant on assistive device for balance Standing balance-Leahy Scale: Poor Standing balance comment: reliant on external support for stability                            Communication Communication Communication: Impaired Factors Affecting Communication: Hearing impaired  Cognition Arousal: Alert Behavior During Therapy: WFL for tasks assessed/performed   PT - Cognitive impairments: No apparent impairments                         Following commands: Intact      Cueing Cueing Techniques: Verbal cues  Exercises      General Comments General comments (skin integrity, edema, etc.): HR 80 at rest and 94 with activity      Pertinent Vitals/Pain Pain Assessment  Pain Assessment: No/denies pain    Home Living                          Prior Function            PT Goals (current goals can now be found in the care plan section) Acute Rehab PT Goals Patient Stated Goal: regain strength, return home PT Goal Formulation: With  patient Time For Goal Achievement: 01/30/24 Potential to Achieve Goals: Good Progress towards PT goals: Progressing toward goals    Frequency    Min 2X/week      PT Plan      Co-evaluation              AM-PAC PT 6 Clicks Mobility   Outcome Measure  Help needed turning from your back to your side while in a flat bed without using bedrails?: A Little Help needed moving from lying on your back to sitting on the side of a flat bed without using bedrails?: A Little Help needed moving to and from a bed to a chair (including a wheelchair)?: A Little Help needed standing up from a chair using your arms (e.g., wheelchair or bedside chair)?: A Little Help needed to walk in hospital room?: A Little Help needed climbing 3-5 steps with a railing? : Total 6 Click Score: 16    End of Session Equipment Utilized During Treatment: Gait belt Activity Tolerance: Patient tolerated treatment well;Patient limited by fatigue Patient left: in bed;with call bell/phone within reach;with bed alarm set Nurse Communication: Mobility status PT Visit Diagnosis: Other abnormalities of gait and mobility (R26.89);Muscle weakness (generalized) (M62.81)     Time: 9063-9044 PT Time Calculation (min) (ACUTE ONLY): 19 min  Charges:    $Therapeutic Activity: 8-22 mins PT General Charges $$ ACUTE PT VISIT: 1 Visit                     Leontine Hilt DPT Acute Rehab Services [phone] Prefer contact via chat    Leontine NOVAK Fatih Stalvey 01/19/2024, 11:35 AM

## 2024-01-19 NOTE — Plan of Care (Signed)

## 2024-01-19 NOTE — Telephone Encounter (Addendum)
 Auth Submission: NO AUTH NEEDED Payer: [employer] MEDICARE Medication & CPT/J Code(s) submitted: Feraheme (ferumoxytol) [id number] Diagnosis Code: D50.9, K92.2 Route of submission (phone, fax, portal): phone Phone # Fax # Auth type: Buy/Bill HB Units/visits requested: 510mg  x 2 doses Reference number: [id number] Approval from: 01/19/2024 to 04/04/24    Note: per rep, eligibility ends 02/04/24. Need to see if patient has other insurance starting in January.   Karina [name], CPhT [facility] Phone: [phone] 01/19/2024

## 2024-01-19 NOTE — Discharge Summary (Signed)
 [facility] Physician Discharge Summary   Patient name: Karina [name]  Admit date:     01/12/2024  Discharge date: 01/19/2024  Attending Physician: DENA CHARLESTON [[id number]]  Discharge Physician: Norval Bar   PCP: Minta Jon BROCKS, NP  Admitted From: Home   Disposition:  Home  Recommendations for Outpatient Follow-up:  Follow up with PCP in 1-2 weeks Follow up repeat blood work (CBC, BMP) in one week to monitor Hgb stability and renal function for anticoagulation resumption and dosing  Home Health:Yes Equipment/Devices: @ECDMELIST @  Discharge Condition:Stable CODE STATUS:FULL Diet recommendation: Heart Healthy Fluid Restriction: None  Hospital Summary: 83 y.o. female with medical history significant of GERD, hypertension who presented to the emergency department due to shortness of breath and weakness.  Found to have acute on chronic anemia as well as CHFpEF exacerbation.   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 Hemoglobin now 8.2 < 8.8, baseline around 9-11. Occult stool positive. Status post EGD-  2cm hiatal hernia, benign fundic gland polyps of the stomach, largest biopsied. 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 Duodenal polyp resected. Status post colonoscopy- A single non-bleeding colonic angiodysplastic lesion. Treated with APC, two 3 to 4 mm polyps in the descending colon removed with a cold snare. Resected and retrieved. One 3 mm polyp in the sigmoid colon, removed. Diverticulosis in the sigmoid colon and internal hemorrhoids. Iron  studies 12/8 concerning for iron  deficiency. Given ongoing lethargy somnolence with concern for symptomatic anemia, given one dose of IV iron  sucrose. Given 1 unit pRBC on 12/14.  - continue to hold eliquis   - repeat CBC on 01/23/24 - consider eliquis  resumption thereafter if Hgb remains stable -  Plan to continue further IV iron  doses as outpatient, referral given - follow up with PCP within one week   Acute hypoxic respiratory failure (POA) Weaned off to room air, 5L on presentation, not on oxygen at home. Improved with diuresis.    Acute kidney injury Improving. Serum creatinine 1.52 < 2.1, baseline around 1.3. Likely exacerbated from IV diuresis that was discontinued.  - resume home HCTZ - Monitor BMP on 01/23/24   Acute exacerbation of HFpEF HTN Noted dyspneic, edematous, hypoxic on presentation. Chest x-ray showed congestion. Elevated proBNP. Echo showing EF 60%, IV normal with >50% respiratory variability. Status post IV Lasix  twice daily. - resume home HCTZ - Cont home metoprolol  50mg  BID and amlodipine    Hypokalemia Likely in the setting of aggressive diuresis. Monitor and repleted while inpatient - follow BMP as needed   Atrial fibrillation Hx CVA in 05/2020 Cont metoprolol   - eliquis  on hold given symptomatic blood loss anemia - follow repeat CBC 01/23/24 - consider resumption of eliquis  if Hgb remains stable and dose based on advanced age and renal function  Discharge Diagnoses:  Principal Problem:   Symptomatic anemia Active Problems:   Hypothyroidism   HTN (hypertension)   Paroxysmal atrial fibrillation (HCC)   GI bleed   Acute on chronic heart failure with preserved ejection fraction (HFpEF) (HCC)   Acute diastolic CHF (congestive heart failure) (HCC)   Acute respiratory failure with hypoxia (HCC)   Heme positive stool   Dark stools   Anticoagulated   Gastric polyp   Adenomatous duodenal polyp   AVM (arteriovenous malformation) of small bowel, acquired   Benign neoplasm of colon   Discharge Instructions  Discharge Instructions     Amb Referral to Intravenous Iron  Therapy  Complete by: As directed    You have been referred to [facility] Infusion team for IV Iron  Infusions. The infusion pharmacy team will reach out to you with appointment  information.    Primary Diagnosis Code for IV Iron : D50.9 - Iron  deficiency Anemia   Secondary diagnosis code for IV iron : I50.xx - Heart failure related dx codes   Increase activity slowly   Complete by: As directed       Allergies as of 01/19/2024       Reactions   Erythromycin    Itching; Has taken since with no issues (01/15/24)   Escitalopram Other (See Comments)   insomnia   Bupropion Itching, Other (See Comments)   insomnia Unknown Unknown insomnia        Medication List     STOP taking these medications    apixaban  5 MG Tabs tablet Commonly known as: Eliquis    magnesium  oxide 400 MG tablet Commonly known as: MAG-OX   traMADol  50 MG tablet Commonly known as: ULTRAM        TAKE these medications    acetaminophen  325 MG tablet Commonly known as: TYLENOL  Take 2 tablets (650 mg total) by mouth every 6 (six) hours as needed for mild pain (pain score 1-3) or moderate pain (pain score 4-6) (or Fever >/= 101).   amLODipine  2.5 MG tablet Commonly known as: NORVASC  Take 2.5 mg by mouth daily.   atorvastatin  40 MG tablet Commonly known as: LIPITOR Take 1 tablet (40 mg total) by mouth daily.   CALCIUM  600 PO Take 1 tablet by mouth in the morning and at bedtime.   chlorpheniramine 4 MG tablet Commonly known as: CHLOR-TRIMETON Take 4 mg by mouth 2 (two) times daily as needed for allergies.   clobetasol cream 0.05 % Commonly known as: TEMOVATE Apply 1 application topically 2 (two) times daily.   fenofibrate  54 MG tablet Take 1 tablet (54 mg total) by mouth daily.   hydrochlorothiazide  12.5 MG capsule Commonly known as: MICROZIDE  Take 12.5 mg by mouth daily.   hydrOXYzine 10 MG tablet Commonly known as: ATARAX Take 10 mg by mouth 3 (three) times daily as needed for anxiety.   levothyroxine  100 MCG tablet Commonly known as: SYNTHROID  Take 100-150 mcg by mouth See admin instructions. Taking 1 & 1/2 tab ( ) on Sunday only. All other days 100 mcg  daily.   metoprolol  tartrate 50 MG tablet Commonly known as: LOPRESSOR  Take 1 tablet (50 mg total) by mouth 2 (two) times daily. Appointment Required For Further Refills [phone]   pantoprazole  40 MG tablet Commonly known as: PROTONIX  Take 40 mg by mouth 2 (two) times daily before a meal.   PRESERVISION AREDS 2 PO Take 1 tablet by mouth 2 (two) times daily.   senna 8.6 MG Tabs tablet Commonly known as: SENOKOT Take 1 tablet (8.6 mg total) by mouth 2 (two) times daily.        Contact information for follow-up providers     Minta Jon BROCKS, NP Follow up on 01/30/2024.   Why: 10:40 for hospital follow up Contact information: [address] [place] [address] [phone]              Contact information for after-discharge care     Home Medical Care     [facility]) .   Service: Home Health Services Why: Agency will  call you to set up apt times Contact information: [address]  [place]  [address] [phone]                    Allergies[1]  Discharge Exam: Vitals:   01/19/24 0500 01/19/24 0855  BP:  (!) 130/55  Pulse: 68 88  Resp: 14 17  Temp:  98.4 F (36.9 C)  SpO2: 100% 98%    Physical Exam Vitals and nursing note reviewed.  Constitutional:      General: She is not in acute distress.    Appearance: She is ill-appearing.     Comments: Weak, frail  HENT:     Head: Normocephalic and atraumatic.  Cardiovascular:     Rate and Rhythm: Normal rate and regular rhythm.     Pulses: Normal pulses.     Heart sounds: Normal heart sounds.  Pulmonary:     Effort: Pulmonary effort is normal.     Breath sounds: Normal breath sounds.  Abdominal:     General: Bowel sounds are normal.     Palpations: Abdomen is soft.  Neurological:     Mental Status: She is alert. Mental status is at baseline.     The results of significant diagnostics from this hospitalization  (including imaging, microbiology, ancillary and laboratory) are listed below for reference.    Microbiology: No results found for this or any previous visit (from the past 240 hours).   Labs: ProBNP, BNP (last 5 results) Recent Labs    01/12/24 1344  PROBNP 2,539.0*   Basic Metabolic Panel: Recent Labs  Lab 01/13/24 0245 01/14/24 0251 01/15/24 0225 01/15/24 1058 01/16/24 0216 01/17/24 0216  NA 141 139 137 139 139 141  K 4.4 3.9 3.4* 3.3* 2.9* 3.8  CL 105 95* 93* 93* 97* 105  CO2 27 33* 37*  --  29 29  GLUCOSE 82 90 97 95 126* 115*  BUN 14 22 28* 27* 25* 21  CREATININE 1.26* 1.91* 2.17* 2.10* 1.64* 1.52*  CALCIUM  8.5* 8.5* 8.3*  --  8.8* 8.4*  MG 2.0 1.9 1.7  --  1.8  --    Liver Function Tests: Recent Labs  Lab 01/13/24 0245  AST 28  ALT 13  ALKPHOS 80  BILITOT 1.1  PROT 6.7  ALBUMIN 3.3*   No results for input(s): LIPASE, AMYLASE in the last 168 hours. No results for input(s): AMMONIA in the last 168 hours. CBC: Recent Labs  Lab 01/13/24 0832 01/14/24 0251 01/15/24 0225 01/15/24 1058 01/17/24 0216 01/17/24 1027 01/18/24 0155 01/18/24 1103 01/19/24 0733  WBC 10.9* 12.6* 11.0*  --  12.2*  --  11.6*  --  11.4*  NEUTROABS 7.8*  --   --   --   --   --   --   --   --   HGB 7.8* 7.7* 7.0*   < > 7.0* 7.1* 6.7* 8.8* 8.2*  HCT 24.4* 24.4* 21.7*   < > 21.8* 22.3* 21.1* 26.8* 25.7*  MCV 86.5 85.6 83.1  --  84.8  --  84.4  --  86.5  PLT 452* 468* 450*  --  389  --  434*  --  408*   < > = values in this interval not displayed.   Cardiac Enzymes: No results for input(s): CKTOTAL, CKMB, CKMBINDEX, TROPONINI, TROPONINIHS in the last 168 hours. BNP: No results for input(s): BNP in the last 168 hours. CBG: Recent Labs  Lab 01/14/24 1753  GLUCAP 150*   D-Dimer No results for input(s): DDIMER in the last 72 hours. Hgb A1c No  results for input(s): HGBA1C in the last 72 hours. Lipid Profile No results for input(s): CHOL, HDL,  LDLCALC, TRIG, CHOLHDL, LDLDIRECT in the last 72 hours. Thyroid  function studies No results for input(s): TSH, T4TOTAL, FREET4, T3FREE, THYROIDAB in the last 72 hours.  Invalid input(s): FREET3 Anemia work up No results for input(s): VITAMINB12, FOLATE, FERRITIN, TIBC, IRON , RETICCTPCT in the last 72 hours. Urinalysis No results found for: COLORURINE, APPEARANCEUR, LABSPEC, PHURINE, GLUCOSEU, HGBUR, BILIRUBINUR, KETONESUR, PROTEINUR, UROBILINOGEN, NITRITE, LEUKOCYTESUR Sepsis Labs Recent Labs  Lab 01/15/24 0225 01/17/24 0216 01/18/24 0155 01/19/24 0733  WBC 11.0* 12.2* 11.6* 11.4*    Procedures/Studies: ECHOCARDIOGRAM COMPLETE Result Date: 01/13/2024    ECHOCARDIOGRAM REPORT   Patient Name:   [facility] Date of Exam: 01/13/2024 Medical Rec #:  [id number]   Height:       62.0 in Accession #:    [id number]  Weight:       150.8 lb Date of Birth:  Jul 01, 1940    BSA:          1.695 m Patient Age:    83 years    BP:           148/51 mmHg Patient Gender: F           HR:           77 bpm. Exam Location:  Inpatient Procedure: 2D Echo, Cardiac Doppler and Color Doppler (Both Spectral and Color            Flow Doppler were utilized during procedure). Indications:    CHF- Acute Systolic I50.21  History:        Patient has prior history of Echocardiogram examinations, most                 recent 09/10/2019. CVA; Risk Factors:Hypertension, GERD and Former                 Smoker.  Sonographer:    Koleen Popper RDCS Referring Phys: [id number] ROBERT DORRELL IMPRESSIONS  1. Left ventricular ejection fraction, by estimation, is 60 to 65%. The left ventricle has normal function. The left ventricle has no regional wall motion abnormalities. There is mild concentric left ventricular hypertrophy. Left ventricular diastolic parameters were normal.  2. Right ventricular systolic function is normal. The right ventricular size is normal. There is normal pulmonary artery  systolic pressure.  3. The mitral valve is degenerative. No evidence of mitral valve regurgitation. No evidence of mitral stenosis.  4. The aortic valve is normal in structure. Aortic valve regurgitation is moderate. No aortic stenosis is present.  5. The inferior vena cava is normal in size with greater than 50% respiratory variability, suggesting right atrial pressure of 3 mmHg. FINDINGS  Left Ventricle: Left ventricular ejection fraction, by estimation, is 60 to 65%. The left ventricle has normal function. The left ventricle has no regional wall motion abnormalities. The left ventricular internal cavity size was normal in size. There is  mild concentric left ventricular hypertrophy. Left ventricular diastolic parameters were normal. Right Ventricle: The right ventricular size is normal. No increase in right ventricular wall thickness. Right ventricular systolic function is normal. There is normal pulmonary artery systolic pressure. The tricuspid regurgitant velocity is 2.77 m/s, and  with an assumed right atrial pressure of 3 mmHg, the estimated right ventricular systolic pressure is 33.7 mmHg. Left Atrium: Left atrial size was normal in size. Right Atrium: Right atrial size was normal in size. Pericardium: There is no evidence of pericardial effusion. Presence  of epicardial fat layer. Mitral Valve: The mitral valve is degenerative in appearance. No evidence of mitral valve regurgitation. No evidence of mitral valve stenosis. Tricuspid Valve: The tricuspid valve is normal in structure. Tricuspid valve regurgitation is not demonstrated. No evidence of tricuspid stenosis. Aortic Valve: The aortic valve is normal in structure. Aortic valve regurgitation is moderate. No aortic stenosis is present. Pulmonic Valve: The pulmonic valve was normal in structure. Pulmonic valve regurgitation is not visualized. No evidence of pulmonic stenosis. Aorta: The aortic root is normal in size and structure. Venous: The inferior vena  cava is normal in size with greater than 50% respiratory variability, suggesting right atrial pressure of 3 mmHg. IAS/Shunts: No atrial level shunt detected by color flow Doppler.  LEFT VENTRICLE PLAX 2D LVIDd:         4.10 cm      Diastology LVIDs:         2.60 cm      LV e' medial:    5.33 cm/s LV PW:         1.10 cm      LV E/e' medial:  26.1 LV IVS:        1.10 cm      LV e' lateral:   6.74 cm/s LVOT diam:     1.60 cm      LV E/e' lateral: 20.6 LV SV:         65 LV SV Index:   38 LVOT Area:     2.01 cm  LV Volumes (MOD) LV vol d, MOD A2C: 88.8 ml LV vol d, MOD A4C: 111.0 ml LV vol s, MOD A2C: 33.5 ml LV vol s, MOD A4C: 44.4 ml LV SV MOD A2C:     55.3 ml LV SV MOD A4C:     111.0 ml LV SV MOD BP:      65.5 ml RIGHT VENTRICLE             IVC RV S prime:     13.20 cm/s  IVC diam: 1.80 cm TAPSE (M-mode): 1.9 cm LEFT ATRIUM             Index        RIGHT ATRIUM           Index LA diam:        4.20 cm 2.48 cm/m   RA Area:     14.10 cm LA Vol (A2C):   58.1 ml 34.27 ml/m  RA Volume:   32.60 ml  19.23 ml/m LA Vol (A4C):   40.4 ml 23.83 ml/m LA Biplane Vol: 50.5 ml 29.79 ml/m  AORTIC VALVE LVOT Vmax:   144.00 cm/s LVOT Vmean:  93.700 cm/s LVOT VTI:    0.322 m  AORTA Ao Root diam: 2.70 cm Ao Asc diam:  3.40 cm MITRAL VALVE                TRICUSPID VALVE MV Area (PHT): 4.15 cm     TR Peak grad:   30.7 mmHg MV Decel Time: 183 msec     TR Vmax:        277.00 cm/s MV E velocity: 139.00 cm/s MV A velocity: 78.20 cm/s   SHUNTS MV E/A ratio:  1.78         Systemic VTI:  0.32 m                             Systemic Diam: 1.60  cm Kardie Tobb DO Electronically signed by Dub Huntsman DO Signature Date/Time: 01/13/2024/3:06:45 PM    Final    CT CHEST ABDOMEN PELVIS W CONTRAST Result Date: 01/12/2024 CLINICAL DATA:  Remote history of ovarian cancer, generalized weakness, anemia, shortness of breath EXAM: CT CHEST, ABDOMEN, AND PELVIS WITH CONTRAST TECHNIQUE: Multidetector CT imaging of the chest, abdomen and pelvis was performed  following the standard protocol during bolus administration of intravenous contrast. RADIATION DOSE REDUCTION: This exam was performed according to the departmental dose-optimization program which includes automated exposure control, adjustment of the mA and/or kV according to patient size and/or use of iterative reconstruction technique. CONTRAST:  OMNIPAQUE  IOHEXOL  300 MG/ML  SOLN COMPARISON:  04/04/2023, 09/02/2022, 05/09/2020 FINDINGS: CT CHEST FINDINGS Cardiovascular: The heart is unremarkable without pericardial effusion. Stable calcifications of the mitral annulus. No evidence of thoracic aortic aneurysm or dissection. Stable atherosclerosis of the aorta and coronary vasculature. Mediastinum/Nodes: Enlarged precarinal lymph node measuring 15 mm in short axis reference image 20/2, previously 13 mm. No other pathologic adenopathy elsewhere within the mediastinal, hilar, or axillary regions. Trachea and esophagus are unremarkable. Lungs/Pleura: There are small free-flowing bilateral pleural effusions. Scattered areas of atelectasis are seen within the bilateral lower lobes, obscuring the subpleural left lower lobe nodule seen on 09/02/2022 staging exam. Stable emphysema. Upper lobe predominant interlobular septal thickening and mild ground-glass airspace disease. No pneumothorax. The central airways are patent. Musculoskeletal: There are no acute or destructive bony abnormalities. Reconstructed images demonstrate no additional findings. CT ABDOMEN PELVIS FINDINGS Hepatobiliary: No focal liver abnormality is seen. Status post cholecystectomy. No biliary dilatation. Pancreas: Unremarkable. No pancreatic ductal dilatation or surrounding inflammatory changes. Spleen: Normal in size without focal abnormality. Adrenals/Urinary Tract: Stable small calcifications of the bilateral renal hila, likely a combination of vascular calcifications and punctate nonobstructing calculi. There is no obstructive uropathy within  either kidney. Chronic areas of renal cortical scarring are seen, right greater than left. The adrenals and bladder are unremarkable. Stomach/Bowel: No bowel obstruction or ileus. Prior appendectomy. Scattered sigmoid diverticulosis without diverticulitis. 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 No bowel wall thickening. Vascular/Lymphatic: Stable dilation of the infrarenal abdominal aortic aneurysm measuring up to 2.8 cm. There is focal ulcerative plaque within this region as well, with no evidence of aortic leak or rupture. Diffuse atherosclerosis throughout the iliac and femoral arteries with multifocal high-grade stenoses noted. No pathologic adenopathy. Reproductive: Previous hysterectomy.  No adnexal masses. Other: No free fluid or free intraperitoneal gas. No evidence of peritoneal nodularity or omental thickening. No abdominal wall hernia. Musculoskeletal: No acute or destructive bony abnormalities. Reconstructed images demonstrate chronic compression deformities at T11 and L1, stable. IMPRESSION: 1. Upper lobe predominant interlobular septal thickening, ground-glass opacities, and bilateral pleural effusions, favoring volume overload and developing pulmonary edema. 2. No evidence of intrathoracic, intra-abdominal, or intrapelvic metastases. 3. Mild dilation of the infrarenal abdominal aorta measuring 2.8 cm, with focal ulcerative atheromatous plaque within the dilated aorta as above. No frank aneurysm. Consultation with vascular surgery could be considered for continued monitoring of the ulcerative plaque. 4. Sigmoid diverticulosis without diverticulitis. 5. Aortic Atherosclerosis (ICD10-I70.0) and Emphysema (ICD10-J43.9). Electronically Signed   By: Ozell Daring M.D.   On: 01/12/2024 16:05   DG Chest 2 View Result Date: 01/12/2024 CLINICAL DATA:  Shortness of breath. EXAM: CHEST - 2 VIEW COMPARISON:  Chest CT 04/04/2023 and chest radiograph  05/08/2020 FINDINGS: Left subclavian Port-A-Cath is stable with the tip at the superior cavoatrial  junction. Heart size is probably enlarged. Bibasilar chest densities, left side larger than right. Findings are suggestive for pleural effusions. Lung markings are slightly enlarged and there may be some vascular congestion. Trachea is midline. Old compression fractures at T11 and L1. IMPRESSION: 1. Bibasilar chest densities, left side greater than right. Findings are suggestive for pleural effusions. 2. Cardiomegaly with possible vascular congestion. Electronically Signed   By: Juliene Balder M.D.   On: 01/12/2024 14:27    Time coordinating discharge: 45 mins  SIGNED:  Norval Bar, MD [facility] 01/19/2024, 11:36 AM     [1]  Allergies Allergen Reactions   Erythromycin     Itching; Has taken since with no issues (01/15/24)   Escitalopram Other (See Comments)    insomnia   Bupropion Itching and Other (See Comments)    insomnia Unknown Unknown insomnia

## 2024-01-19 NOTE — Progress Notes (Signed)
 Occupational Therapy Treatment Patient Details Name: Karina [name] MRN: [id number] DOB: Feb 05, 1940 Today's Date: 01/19/2024   History of present illness 83 y.o. female admitted 01/12/24 with SOB, weakness. Workup for anemia, concern for GIB, AKI, CHF exacerbation. S/p EGD and colonoscopy. PMH includes CHF, afib, COPD, HTN, GERD, ovarian CA, CVA.   OT comments  Pt presented on 2L o2 via [place] at 99% and was placed on RA throughout the session and remained in 90s% and was left on RA (Nursing aware). Pt in session is making small progression as only needed CGA for sit to stand transfers but still needing CGA with ambulation and cues for safety. Karina [name] was able to also complete some ADLS in standing but mainly in sitting while at the sink. Karina [name] requires set up for UE and CGA-min assist for LE ADLS. At this time recommendation for [facility] as pt declining SNF.       If plan is discharge home, recommend the following:  A little help with walking and/or transfers;A little help with bathing/dressing/bathroom;Assistance with cooking/housework;Direct supervision/assist for financial management;Assist for transportation;Help with stairs or ramp for entrance   Equipment Recommendations  Tub/shower seat    Recommendations for Other Services      Precautions / Restrictions Precautions Precautions: Fall;Other (comment) Recall of Precautions/Restrictions: Intact Precaution/Restrictions Comments: watch SpO2 (does not wear O2 baseline) Restrictions Weight Bearing Restrictions Per Provider Order: No       Mobility Bed Mobility Overal bed mobility: Modified Independent Bed Mobility: Supine to Sit     Supine to sit: Used rails, HOB elevated, Modified independent (Device/Increase time)     General bed mobility comments: needed rail but no physical assist with increase in time    Transfers Overall transfer level: Needs assistance Equipment used: Rolling walker (2 wheels) Transfers: Sit to/from Stand Sit to  Stand: Contact guard assist           General transfer comment: pt able to complete all sit to stands with no physical assist today     Balance Overall balance assessment: Needs assistance Sitting-balance support: Feet supported Sitting balance-Leahy Scale: Good     Standing balance support: Single extremity supported, Bilateral upper extremity supported Standing balance-Leahy Scale: Fair Standing balance comment: needs sBUE support when ambulating and able to compelte some ADLS in standing with unilateral support                           ADL either performed or assessed with clinical judgement   ADL Overall ADL's : Needs assistance/impaired Eating/Feeding: Independent;Sitting   Grooming: Wash/dry hands;Wash/dry face;Set up;Sitting;Standing   Upper Body Bathing: Set up;Sitting   Lower Body Bathing: Contact guard assist;Minimal assistance;Sit to/from stand;Sitting/lateral leans   Upper Body Dressing : Set up;Sitting   Lower Body Dressing: Contact guard assist;Minimal assistance;Sit to/from stand;Sitting/lateral leans   Toilet Transfer: Contact guard assist;Cueing for safety;Cueing for sequencing;Rolling walker (2 wheels)   Toileting- Clothing Manipulation and Hygiene: Contact guard assist;Minimal assistance;Sit to/from stand;Sitting/lateral lean       Functional mobility during ADLs: Contact guard assist;Rolling walker (2 wheels);Cueing for sequencing;Cueing for safety      Extremity/Trunk Assessment Upper Extremity Assessment Upper Extremity Assessment: Generalized weakness;LUE deficits/detail LUE Deficits / Details: Per pt has trouble with L side due to old injuries LUE Sensation: WNL LUE Coordination: WNL   Lower Extremity Assessment Lower Extremity Assessment: Defer to PT evaluation        Vision   Vision Assessment?: Wears glasses for reading;Wears  glasses for driving   Perception Perception Perception: Within Functional Limits   Praxis  Praxis Praxis: [facility]   Communication Communication Communication: Impaired Factors Affecting Communication: Hearing impaired   Cognition Arousal: Alert Behavior During Therapy: WFL for tasks assessed/performed Cognition:  (noted slightly fogetful in session)                               Following commands: Intact        Cueing   Cueing Techniques: Verbal cues  Exercises      Shoulder Instructions       General Comments      Pertinent Vitals/ Pain       Pain Assessment Pain Assessment: No/denies pain  Home Living                                          Prior Functioning/Environment              Frequency  Min 2X/week        Progress Toward Goals  OT Goals(current goals can now be found in the care plan section)  Progress towards OT goals: Progressing toward goals  Acute Rehab OT Goals Patient Stated Goal: to go home OT Goal Formulation: With patient Time For Goal Achievement: 01/31/24 Potential to Achieve Goals: Good ADL Goals Pt Will Perform Upper Body Bathing: Independently Pt Will Perform Lower Body Bathing: with modified independence Pt Will Perform Upper Body Dressing: Independently Pt Will Perform Lower Body Dressing: with modified independence Pt Will Transfer to Toilet: with modified independence  Plan      Co-evaluation                 AM-PAC OT 6 Clicks Daily Activity     Outcome Measure   Help from another person eating meals?: None Help from another person taking care of personal grooming?: A Little Help from another person toileting, which includes using toliet, bedpan, or urinal?: A Little Help from another person bathing (including washing, rinsing, drying)?: A Little Help from another person to put on and taking off regular upper body clothing?: A Little Help from another person to put on and taking off regular lower body clothing?: A Little 6 Click Score: 19    End of Session  Equipment Utilized During Treatment: Gait belt;Rolling walker (2 wheels)  OT Visit Diagnosis: Unsteadiness on feet (R26.81);Other abnormalities of gait and mobility (R26.89);Muscle weakness (generalized) (M62.81)   Activity Tolerance Patient tolerated treatment well   Patient Left in chair;with call bell/phone within reach;with chair alarm set   Nurse Communication Mobility status        Time: [phone] OT Time Calculation (min): 44 min  Charges: OT General Charges $OT Visit: 1 Visit OT Evaluation $OT Eval Low Complexity: 1 Low OT Treatments $Self Care/Home Management : 38-52 mins  Warrick POUR OTR/L  Acute Rehab Services  [phone] office number   Warrick Berber 01/19/2024, 8:19 AM

## 2024-01-21 ENCOUNTER — Encounter (HOSPITAL_COMMUNITY): Payer: Self-pay

## 2024-01-23 ENCOUNTER — Encounter (HOSPITAL_COMMUNITY): Payer: Self-pay

## 2024-02-09 NOTE — Telephone Encounter (Signed)
 Submitted eligibility check for [facility] and member is active for 2026. No auth needed for [employer].
